# Patient Record
Sex: Female | Born: 1960
Health system: Southern US, Community
[De-identification: ages and names within clinical notes are randomized; demographics above are authoritative.]

## PROBLEM LIST (undated history)

## (undated) DIAGNOSIS — T7840XA Allergy, unspecified, initial encounter: Secondary | ICD-10-CM

## (undated) DIAGNOSIS — G43909 Migraine, unspecified, not intractable, without status migrainosus: Secondary | ICD-10-CM

## (undated) DIAGNOSIS — F32A Depression, unspecified: Secondary | ICD-10-CM

## (undated) DIAGNOSIS — D649 Anemia, unspecified: Secondary | ICD-10-CM

## (undated) DIAGNOSIS — C50919 Malignant neoplasm of unspecified site of unspecified female breast: Secondary | ICD-10-CM

## (undated) DIAGNOSIS — F329 Major depressive disorder, single episode, unspecified: Secondary | ICD-10-CM

## (undated) DIAGNOSIS — Z889 Allergy status to unspecified drugs, medicaments and biological substances status: Secondary | ICD-10-CM

## (undated) DIAGNOSIS — F419 Anxiety disorder, unspecified: Secondary | ICD-10-CM

## (undated) DIAGNOSIS — R1310 Dysphagia, unspecified: Secondary | ICD-10-CM

## (undated) DIAGNOSIS — K219 Gastro-esophageal reflux disease without esophagitis: Secondary | ICD-10-CM

## (undated) DIAGNOSIS — Z8619 Personal history of other infectious and parasitic diseases: Secondary | ICD-10-CM

## (undated) DIAGNOSIS — M1990 Unspecified osteoarthritis, unspecified site: Secondary | ICD-10-CM

## (undated) DIAGNOSIS — K5909 Other constipation: Secondary | ICD-10-CM

## (undated) DIAGNOSIS — K5792 Diverticulitis of intestine, part unspecified, without perforation or abscess without bleeding: Secondary | ICD-10-CM

## (undated) DIAGNOSIS — M549 Dorsalgia, unspecified: Secondary | ICD-10-CM

## (undated) DIAGNOSIS — K228 Other specified diseases of esophagus: Secondary | ICD-10-CM

## (undated) DIAGNOSIS — K5190 Ulcerative colitis, unspecified, without complications: Secondary | ICD-10-CM

## (undated) DIAGNOSIS — H269 Unspecified cataract: Secondary | ICD-10-CM

## (undated) DIAGNOSIS — K635 Polyp of colon: Secondary | ICD-10-CM

## (undated) DIAGNOSIS — E039 Hypothyroidism, unspecified: Secondary | ICD-10-CM

## (undated) DIAGNOSIS — G43019 Migraine without aura, intractable, without status migrainosus: Secondary | ICD-10-CM

## (undated) DIAGNOSIS — G629 Polyneuropathy, unspecified: Secondary | ICD-10-CM

## (undated) DIAGNOSIS — Z973 Presence of spectacles and contact lenses: Secondary | ICD-10-CM

## (undated) DIAGNOSIS — Z1379 Encounter for other screening for genetic and chromosomal anomalies: Secondary | ICD-10-CM

## (undated) DIAGNOSIS — Z9289 Personal history of other medical treatment: Secondary | ICD-10-CM

## (undated) DIAGNOSIS — K449 Diaphragmatic hernia without obstruction or gangrene: Secondary | ICD-10-CM

## (undated) DIAGNOSIS — K2281 Esophageal polyp: Secondary | ICD-10-CM

## (undated) HISTORY — PX: ABDOMINAL WOUND DEHISCENCE: SHX540

## (undated) HISTORY — PX: APPENDECTOMY: SHX54

## (undated) HISTORY — DX: Polyp of colon: K63.5

## (undated) HISTORY — PX: CHOLECYSTECTOMY: SHX55

## (undated) HISTORY — PX: UPPER GI ENDOSCOPY: SHX6162

## (undated) HISTORY — DX: Ulcerative colitis, unspecified, without complications: K51.90

## (undated) HISTORY — PX: MRI: SHX5353

## (undated) HISTORY — DX: Migraine, unspecified, not intractable, without status migrainosus: G43.909

## (undated) HISTORY — DX: Malignant neoplasm of unspecified site of unspecified female breast: C50.919

## (undated) HISTORY — PX: COLONOSCOPY: SHX174

## (undated) HISTORY — DX: Diaphragmatic hernia without obstruction or gangrene: K44.9

## (undated) HISTORY — DX: Unspecified cataract: H26.9

## (undated) HISTORY — DX: Hypothyroidism, unspecified: E03.9

## (undated) HISTORY — DX: Migraine without aura, intractable, without status migrainosus: G43.019

## (undated) HISTORY — DX: Dorsalgia, unspecified: M54.9

## (undated) HISTORY — PX: TONSILLECTOMY: SUR1361

## (undated) HISTORY — PX: BREAST SURGERY: SHX581

## (undated) HISTORY — DX: Allergy, unspecified, initial encounter: T78.40XA

## (undated) HISTORY — DX: Major depressive disorder, single episode, unspecified: F32.9

## (undated) HISTORY — PX: EYE SURGERY: SHX253

## (undated) HISTORY — DX: Polyneuropathy, unspecified: G62.9

## (undated) HISTORY — DX: Encounter for other screening for genetic and chromosomal anomalies: Z13.79

## (undated) HISTORY — PX: SPINE SURGERY: SHX786

## (undated) HISTORY — DX: Depression, unspecified: F32.A

## (undated) HISTORY — DX: Anxiety disorder, unspecified: F41.9

---
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
## 2003-04-04 ENCOUNTER — Other Ambulatory Visit: Admission: RE | Admit: 2003-04-04 | Discharge: 2003-04-04 | Payer: Self-pay | Admitting: Obstetrics and Gynecology

## 2003-09-14 ENCOUNTER — Encounter: Admission: RE | Admit: 2003-09-14 | Discharge: 2003-09-14 | Payer: Self-pay | Admitting: Family Medicine

## 2003-11-07 ENCOUNTER — Other Ambulatory Visit: Admission: RE | Admit: 2003-11-07 | Discharge: 2003-11-07 | Payer: Self-pay | Admitting: Obstetrics and Gynecology

## 2005-05-01 ENCOUNTER — Other Ambulatory Visit: Admission: RE | Admit: 2005-05-01 | Discharge: 2005-05-01 | Payer: Self-pay | Admitting: Obstetrics and Gynecology

## 2006-07-21 ENCOUNTER — Other Ambulatory Visit: Admission: RE | Admit: 2006-07-21 | Discharge: 2006-07-21 | Payer: Self-pay | Admitting: Obstetrics and Gynecology

## 2006-10-29 ENCOUNTER — Ambulatory Visit: Payer: Self-pay | Admitting: Family Medicine

## 2006-10-29 LAB — CONVERTED CEMR LAB: TSH: 4.11 microintl units/mL (ref 0.35–5.50)

## 2006-12-01 DIAGNOSIS — F32A Depression, unspecified: Secondary | ICD-10-CM | POA: Insufficient documentation

## 2006-12-01 DIAGNOSIS — J309 Allergic rhinitis, unspecified: Secondary | ICD-10-CM | POA: Insufficient documentation

## 2006-12-01 DIAGNOSIS — F329 Major depressive disorder, single episode, unspecified: Secondary | ICD-10-CM

## 2006-12-01 DIAGNOSIS — J45909 Unspecified asthma, uncomplicated: Secondary | ICD-10-CM | POA: Insufficient documentation

## 2006-12-01 DIAGNOSIS — B009 Herpesviral infection, unspecified: Secondary | ICD-10-CM | POA: Insufficient documentation

## 2006-12-01 DIAGNOSIS — E039 Hypothyroidism, unspecified: Secondary | ICD-10-CM | POA: Insufficient documentation

## 2007-06-30 ENCOUNTER — Ambulatory Visit: Payer: Self-pay | Admitting: Family Medicine

## 2007-06-30 ENCOUNTER — Telehealth (INDEPENDENT_AMBULATORY_CARE_PROVIDER_SITE_OTHER): Payer: Self-pay | Admitting: *Deleted

## 2007-06-30 DIAGNOSIS — L2089 Other atopic dermatitis: Secondary | ICD-10-CM | POA: Insufficient documentation

## 2007-06-30 LAB — CONVERTED CEMR LAB: TSH: 6.24 microintl units/mL — ABNORMAL HIGH (ref 0.35–5.50)

## 2007-07-30 ENCOUNTER — Ambulatory Visit: Payer: Self-pay | Admitting: Family Medicine

## 2007-08-11 ENCOUNTER — Telehealth (INDEPENDENT_AMBULATORY_CARE_PROVIDER_SITE_OTHER): Payer: Self-pay | Admitting: *Deleted

## 2007-09-01 ENCOUNTER — Other Ambulatory Visit: Admission: RE | Admit: 2007-09-01 | Discharge: 2007-09-01 | Payer: Self-pay | Admitting: Obstetrics and Gynecology

## 2007-09-01 ENCOUNTER — Encounter (INDEPENDENT_AMBULATORY_CARE_PROVIDER_SITE_OTHER): Payer: Self-pay | Admitting: Family Medicine

## 2007-09-20 ENCOUNTER — Telehealth (INDEPENDENT_AMBULATORY_CARE_PROVIDER_SITE_OTHER): Payer: Self-pay | Admitting: *Deleted

## 2007-10-05 LAB — HM MAMMOGRAPHY: HM Mammogram: NORMAL

## 2007-10-05 LAB — CONVERTED CEMR LAB: Pap Smear: NORMAL

## 2007-10-29 ENCOUNTER — Ambulatory Visit: Payer: Self-pay | Admitting: Family Medicine

## 2007-11-02 ENCOUNTER — Telehealth (INDEPENDENT_AMBULATORY_CARE_PROVIDER_SITE_OTHER): Payer: Self-pay | Admitting: *Deleted

## 2007-11-02 LAB — CONVERTED CEMR LAB
Albumin: 3.6 g/dL (ref 3.5–5.2)
TSH: 2.29 microintl units/mL (ref 0.35–5.50)
Total Protein: 6.9 g/dL (ref 6.0–8.3)

## 2007-11-03 ENCOUNTER — Encounter (INDEPENDENT_AMBULATORY_CARE_PROVIDER_SITE_OTHER): Payer: Self-pay | Admitting: *Deleted

## 2007-11-12 ENCOUNTER — Encounter (INDEPENDENT_AMBULATORY_CARE_PROVIDER_SITE_OTHER): Payer: Self-pay | Admitting: *Deleted

## 2007-11-12 ENCOUNTER — Ambulatory Visit: Payer: Self-pay | Admitting: Internal Medicine

## 2008-01-24 ENCOUNTER — Ambulatory Visit: Payer: Self-pay | Admitting: *Deleted

## 2008-02-08 ENCOUNTER — Encounter: Payer: Self-pay | Admitting: Family Medicine

## 2008-04-14 ENCOUNTER — Ambulatory Visit: Payer: Self-pay | Admitting: Family Medicine

## 2008-04-14 ENCOUNTER — Ambulatory Visit (HOSPITAL_COMMUNITY): Admission: RE | Admit: 2008-04-14 | Discharge: 2008-04-14 | Payer: Self-pay | Admitting: Family Medicine

## 2008-04-17 ENCOUNTER — Telehealth (INDEPENDENT_AMBULATORY_CARE_PROVIDER_SITE_OTHER): Payer: Self-pay | Admitting: *Deleted

## 2008-04-28 ENCOUNTER — Ambulatory Visit: Payer: Self-pay | Admitting: Family Medicine

## 2008-06-07 ENCOUNTER — Ambulatory Visit: Payer: Self-pay | Admitting: Family Medicine

## 2008-06-09 ENCOUNTER — Encounter (INDEPENDENT_AMBULATORY_CARE_PROVIDER_SITE_OTHER): Payer: Self-pay | Admitting: *Deleted

## 2008-06-12 ENCOUNTER — Telehealth (INDEPENDENT_AMBULATORY_CARE_PROVIDER_SITE_OTHER): Payer: Self-pay | Admitting: *Deleted

## 2008-07-26 ENCOUNTER — Ambulatory Visit: Payer: Self-pay | Admitting: Family Medicine

## 2008-07-27 ENCOUNTER — Encounter (INDEPENDENT_AMBULATORY_CARE_PROVIDER_SITE_OTHER): Payer: Self-pay | Admitting: *Deleted

## 2008-09-04 ENCOUNTER — Ambulatory Visit: Payer: Self-pay | Admitting: Family Medicine

## 2008-09-05 ENCOUNTER — Telehealth: Payer: Self-pay | Admitting: Internal Medicine

## 2008-09-06 ENCOUNTER — Telehealth: Payer: Self-pay | Admitting: Family Medicine

## 2008-09-06 ENCOUNTER — Telehealth (INDEPENDENT_AMBULATORY_CARE_PROVIDER_SITE_OTHER): Payer: Self-pay | Admitting: *Deleted

## 2008-09-14 ENCOUNTER — Ambulatory Visit: Payer: Self-pay | Admitting: Family Medicine

## 2008-10-12 ENCOUNTER — Other Ambulatory Visit: Admission: RE | Admit: 2008-10-12 | Discharge: 2008-10-12 | Payer: Self-pay | Admitting: Obstetrics and Gynecology

## 2008-10-12 ENCOUNTER — Telehealth (INDEPENDENT_AMBULATORY_CARE_PROVIDER_SITE_OTHER): Payer: Self-pay | Admitting: *Deleted

## 2008-10-24 ENCOUNTER — Ambulatory Visit: Payer: Self-pay | Admitting: Family Medicine

## 2008-10-25 ENCOUNTER — Telehealth (INDEPENDENT_AMBULATORY_CARE_PROVIDER_SITE_OTHER): Payer: Self-pay | Admitting: *Deleted

## 2008-10-25 LAB — CONVERTED CEMR LAB
Basophils Relative: 0.5 % (ref 0.0–3.0)
Eosinophils Absolute: 0.1 10*3/uL (ref 0.0–0.7)
Hemoglobin: 12.3 g/dL (ref 12.0–15.0)
MCHC: 34 g/dL (ref 30.0–36.0)
MCV: 88.8 fL (ref 78.0–100.0)
Monocytes Absolute: 0.5 10*3/uL (ref 0.1–1.0)
Neutro Abs: 4.2 10*3/uL (ref 1.4–7.7)
Neutrophils Relative %: 61.9 % (ref 43.0–77.0)
RBC: 4.08 M/uL (ref 3.87–5.11)
RDW: 12.9 % (ref 11.5–14.6)

## 2008-11-02 ENCOUNTER — Encounter: Admission: RE | Admit: 2008-11-02 | Discharge: 2008-12-12 | Payer: Self-pay

## 2009-01-12 ENCOUNTER — Ambulatory Visit: Payer: Self-pay | Admitting: Family Medicine

## 2009-01-15 ENCOUNTER — Encounter (INDEPENDENT_AMBULATORY_CARE_PROVIDER_SITE_OTHER): Payer: Self-pay | Admitting: *Deleted

## 2009-01-15 LAB — CONVERTED CEMR LAB
ALT: 13 units/L (ref 0–35)
Basophils Relative: 0.1 % (ref 0.0–3.0)
Bilirubin, Direct: 0 mg/dL (ref 0.0–0.3)
Chloride: 107 meq/L (ref 96–112)
Eosinophils Relative: 0.8 % (ref 0.0–5.0)
HCT: 38.2 % (ref 36.0–46.0)
Hemoglobin: 12.8 g/dL (ref 12.0–15.0)
LDL Cholesterol: 102 mg/dL — ABNORMAL HIGH (ref 0–99)
Lymphs Abs: 1.9 10*3/uL (ref 0.7–4.0)
MCV: 90.3 fL (ref 78.0–100.0)
Monocytes Absolute: 0.5 10*3/uL (ref 0.1–1.0)
Potassium: 4.2 meq/L (ref 3.5–5.1)
RBC: 4.23 M/uL (ref 3.87–5.11)
T3, Free: 3.1 pg/mL (ref 2.3–4.2)
TSH: 0.53 microintl units/mL (ref 0.35–5.50)
Total CHOL/HDL Ratio: 3
Total Protein: 6.9 g/dL (ref 6.0–8.3)
WBC: 6.5 10*3/uL (ref 4.5–10.5)

## 2009-07-10 ENCOUNTER — Telehealth (INDEPENDENT_AMBULATORY_CARE_PROVIDER_SITE_OTHER): Payer: Self-pay | Admitting: *Deleted

## 2009-07-10 ENCOUNTER — Ambulatory Visit: Payer: Self-pay | Admitting: Family

## 2009-07-10 ENCOUNTER — Encounter (INDEPENDENT_AMBULATORY_CARE_PROVIDER_SITE_OTHER): Payer: Self-pay | Admitting: *Deleted

## 2009-07-10 LAB — CONVERTED CEMR LAB: Folate: 7.9 ng/mL

## 2009-07-11 ENCOUNTER — Ambulatory Visit: Payer: Self-pay | Admitting: Cardiology

## 2009-07-11 ENCOUNTER — Telehealth (INDEPENDENT_AMBULATORY_CARE_PROVIDER_SITE_OTHER): Payer: Self-pay | Admitting: *Deleted

## 2009-07-12 ENCOUNTER — Ambulatory Visit: Payer: Self-pay | Admitting: Family

## 2009-07-12 DIAGNOSIS — E538 Deficiency of other specified B group vitamins: Secondary | ICD-10-CM | POA: Insufficient documentation

## 2009-07-12 LAB — CONVERTED CEMR LAB
HDL: 51.9 mg/dL (ref >39.00)
VLDL: 12 mg/dL (ref 0.0–40.0)

## 2009-07-20 ENCOUNTER — Ambulatory Visit: Payer: Self-pay | Admitting: Family

## 2009-07-26 ENCOUNTER — Ambulatory Visit: Payer: Self-pay | Admitting: Family

## 2009-08-02 ENCOUNTER — Ambulatory Visit: Payer: Self-pay | Admitting: Family

## 2009-08-13 ENCOUNTER — Ambulatory Visit: Payer: Self-pay | Admitting: Family

## 2009-08-13 DIAGNOSIS — G43909 Migraine, unspecified, not intractable, without status migrainosus: Secondary | ICD-10-CM | POA: Insufficient documentation

## 2009-08-13 LAB — CONVERTED CEMR LAB: TSH: 0.031 microintl units/mL — ABNORMAL LOW (ref 0.350–4.500)

## 2009-08-14 ENCOUNTER — Telehealth: Payer: Self-pay | Admitting: Family

## 2009-08-15 ENCOUNTER — Telehealth (INDEPENDENT_AMBULATORY_CARE_PROVIDER_SITE_OTHER): Payer: Self-pay | Admitting: *Deleted

## 2009-08-27 ENCOUNTER — Ambulatory Visit: Payer: Self-pay | Admitting: Family

## 2009-08-29 ENCOUNTER — Telehealth: Payer: Self-pay | Admitting: Family

## 2009-09-03 ENCOUNTER — Encounter: Payer: Self-pay | Admitting: Family Medicine

## 2009-09-26 ENCOUNTER — Ambulatory Visit: Payer: Self-pay | Admitting: Family

## 2009-09-27 ENCOUNTER — Other Ambulatory Visit: Admission: RE | Admit: 2009-09-27 | Discharge: 2009-09-27 | Payer: Self-pay | Admitting: Obstetrics and Gynecology

## 2009-10-01 ENCOUNTER — Telehealth: Payer: Self-pay | Admitting: Family

## 2009-10-05 ENCOUNTER — Encounter: Payer: Self-pay | Admitting: Family Medicine

## 2009-10-05 ENCOUNTER — Ambulatory Visit: Payer: Self-pay | Admitting: Internal Medicine

## 2009-10-17 ENCOUNTER — Telehealth (INDEPENDENT_AMBULATORY_CARE_PROVIDER_SITE_OTHER): Payer: Self-pay | Admitting: *Deleted

## 2009-10-24 ENCOUNTER — Ambulatory Visit: Payer: Self-pay | Admitting: Family

## 2009-11-12 ENCOUNTER — Ambulatory Visit: Payer: Self-pay | Admitting: Family

## 2009-11-12 LAB — CONVERTED CEMR LAB: TSH: 0.212 microintl units/mL — ABNORMAL LOW (ref 0.350–4.500)

## 2009-11-13 ENCOUNTER — Telehealth: Payer: Self-pay | Admitting: Family

## 2009-11-22 ENCOUNTER — Telehealth: Payer: Self-pay | Admitting: Family

## 2009-11-23 ENCOUNTER — Ambulatory Visit: Payer: Self-pay | Admitting: Family Medicine

## 2009-12-25 ENCOUNTER — Ambulatory Visit: Payer: Self-pay | Admitting: Family Medicine

## 2009-12-26 LAB — CONVERTED CEMR LAB: TSH: 0.37 microintl units/mL (ref 0.35–5.50)

## 2010-01-10 ENCOUNTER — Telehealth (INDEPENDENT_AMBULATORY_CARE_PROVIDER_SITE_OTHER): Payer: Self-pay | Admitting: *Deleted

## 2010-01-16 ENCOUNTER — Telehealth: Payer: Self-pay | Admitting: Family Medicine

## 2010-01-24 ENCOUNTER — Ambulatory Visit: Payer: Self-pay | Admitting: Family Medicine

## 2010-02-07 ENCOUNTER — Ambulatory Visit: Payer: Self-pay | Admitting: Family Medicine

## 2010-02-07 DIAGNOSIS — R002 Palpitations: Secondary | ICD-10-CM | POA: Insufficient documentation

## 2010-03-20 ENCOUNTER — Encounter (INDEPENDENT_AMBULATORY_CARE_PROVIDER_SITE_OTHER): Payer: Self-pay | Admitting: *Deleted

## 2010-03-27 ENCOUNTER — Encounter (INDEPENDENT_AMBULATORY_CARE_PROVIDER_SITE_OTHER): Payer: Self-pay | Admitting: *Deleted

## 2010-04-01 ENCOUNTER — Ambulatory Visit: Payer: Self-pay | Admitting: Internal Medicine

## 2010-04-05 ENCOUNTER — Ambulatory Visit: Payer: Self-pay | Admitting: Family Medicine

## 2010-04-05 DIAGNOSIS — K5289 Other specified noninfective gastroenteritis and colitis: Secondary | ICD-10-CM | POA: Insufficient documentation

## 2010-04-05 LAB — CONVERTED CEMR LAB
Basophils Relative: 0.4 % (ref 0.0–3.0)
CO2: 30 meq/L (ref 19–32)
Calcium: 9.7 mg/dL (ref 8.4–10.5)
Eosinophils Relative: 0.9 % (ref 0.0–5.0)
GFR calc non Af Amer: 96.74 mL/min (ref >60)
Hemoglobin: 13.5 g/dL (ref 12.0–15.0)
Lymphocytes Relative: 30.7 % (ref 12.0–46.0)
Monocytes Relative: 7.6 % (ref 3.0–12.0)
Neutro Abs: 3.8 10*3/uL (ref 1.4–7.7)
RBC: 4.51 M/uL (ref 3.87–5.11)
Sodium: 140 meq/L (ref 135–145)
WBC: 6.4 10*3/uL (ref 4.5–10.5)

## 2010-04-10 ENCOUNTER — Ambulatory Visit: Payer: Self-pay | Admitting: Gastroenterology

## 2010-04-10 LAB — HM COLONOSCOPY: HM Colonoscopy: NORMAL

## 2010-04-14 ENCOUNTER — Encounter: Payer: Self-pay | Admitting: Gastroenterology

## 2010-04-17 ENCOUNTER — Telehealth: Payer: Self-pay | Admitting: Gastroenterology

## 2010-05-28 ENCOUNTER — Ambulatory Visit: Payer: Self-pay | Admitting: Family Medicine

## 2010-05-28 DIAGNOSIS — G629 Polyneuropathy, unspecified: Secondary | ICD-10-CM | POA: Insufficient documentation

## 2010-06-04 ENCOUNTER — Telehealth (INDEPENDENT_AMBULATORY_CARE_PROVIDER_SITE_OTHER): Payer: Self-pay | Admitting: *Deleted

## 2010-06-06 ENCOUNTER — Ambulatory Visit: Payer: Self-pay | Admitting: Family Medicine

## 2010-06-06 DIAGNOSIS — R05 Cough: Secondary | ICD-10-CM

## 2010-06-06 DIAGNOSIS — M549 Dorsalgia, unspecified: Secondary | ICD-10-CM | POA: Insufficient documentation

## 2010-06-06 DIAGNOSIS — R059 Cough, unspecified: Secondary | ICD-10-CM | POA: Insufficient documentation

## 2010-06-07 ENCOUNTER — Ambulatory Visit (HOSPITAL_BASED_OUTPATIENT_CLINIC_OR_DEPARTMENT_OTHER)
Admission: RE | Admit: 2010-06-07 | Discharge: 2010-06-07 | Payer: Self-pay | Source: Home / Self Care | Admitting: Family Medicine

## 2010-06-07 LAB — CONVERTED CEMR LAB
Alkaline Phosphatase: 96 units/L (ref 39–117)
BUN: 15 mg/dL (ref 6–23)
Basophils Relative: 0.4 % (ref 0.0–3.0)
Bilirubin, Direct: 0.1 mg/dL (ref 0.0–0.3)
Calcium: 9.3 mg/dL (ref 8.4–10.5)
Cholesterol: 210 mg/dL — ABNORMAL HIGH (ref 0–200)
Creatinine, Ser: 0.7 mg/dL (ref 0.4–1.2)
Eosinophils Absolute: 0.1 10*3/uL (ref 0.0–0.7)
Eosinophils Relative: 0.8 % (ref 0.0–5.0)
Lymphocytes Relative: 36.7 % (ref 12.0–46.0)
MCHC: 34.1 g/dL (ref 30.0–36.0)
Neutrophils Relative %: 55.1 % (ref 43.0–77.0)
Platelets: 196 10*3/uL (ref 150.0–400.0)
RBC: 4.11 M/uL (ref 3.87–5.11)
Total Bilirubin: 0.5 mg/dL (ref 0.3–1.2)
Total CHOL/HDL Ratio: 3
Total Protein: 7.1 g/dL (ref 6.0–8.3)
Triglycerides: 50 mg/dL (ref 0.0–149.0)
VLDL: 10 mg/dL (ref 0.0–40.0)
Vit D, 25-Hydroxy: 50 ng/mL (ref 30–89)
WBC: 6.4 10*3/uL (ref 4.5–10.5)

## 2010-07-10 ENCOUNTER — Encounter: Payer: Self-pay | Admitting: Family Medicine

## 2010-08-04 LAB — CONVERTED CEMR LAB
Eosinophils Absolute: 0 10*3/uL (ref 0.0–0.7)
Lymphs Abs: 2.3 10*3/uL (ref 0.7–4.0)
MCV: 90.9 fL (ref 78.0–100.0)
Neutro Abs: 5.2 10*3/uL (ref 1.7–7.7)
Neutrophils Relative %: 65 % (ref 43–77)
Platelets: 276 10*3/uL (ref 150–400)
RBC: 4.51 M/uL (ref 3.87–5.11)
T3, Free: 2.5 pg/mL (ref 2.3–4.2)
T4, Total: 11.3 ug/dL (ref 5.0–12.5)
TSH: 0.7 microintl units/mL (ref 0.35–5.50)
TSH: 7.683 microintl units/mL — ABNORMAL HIGH (ref 0.350–4.50)
Vitamin B-12: 179 pg/mL — ABNORMAL LOW (ref 211–911)
Vitamin B-12: 561 pg/mL (ref 211–911)
WBC: 8 10*3/uL (ref 4.0–10.5)

## 2010-08-06 NOTE — Progress Notes (Signed)
Summary: Pt request test results copy  Phone Note Call from Patient Call back at Home Phone [phone]   Caller: Patient Summary of Call: Please mail a hard copy of TSH reports to pt, address on EMR is correct Initial call taken by: Lannette Donath,  Nov 22, 2009 8:49 AM  Follow-up for Phone Call        Last 2 TSH results mailed to pt.  Mervin Kung CMA  Nov 22, 2009 10:23 AM

## 2010-08-06 NOTE — Letter (Signed)
Summary: [facility]  [facility]   Imported By: Marylou Mccoy 08/23/2009 10:12:51  _____________________________________________________________________  External Attachment:    Type:   Image     Comment:   External Document

## 2010-08-06 NOTE — Progress Notes (Signed)
  Phone Note Outgoing Call   Summary of Call: Called patient and reviewed her TSH results and B12 results. Plan to decrease her synthroid and have her return in 6 weeks for follow up. Initial call taken by: Lemont Fillers FNP,  August 14, 2009 5:47 PM    New/Updated Medications: SYNTHROID 125 MCG TABS (LEVOTHYROXINE SODIUM) one tablet by mouth daily Prescriptions: SYNTHROID 125 MCG TABS (LEVOTHYROXINE SODIUM) one tablet by mouth daily  #30 x 1   Entered and Authorized by:   Lemont Fillers FNP   Signed by:   Lemont Fillers FNP on 08/14/2009   Method used:   Electronically to        [facility] # [phone]* (retail)       [address] [place], [place]  [address]       Ph: [phone]       Fax: [phone]   RxID:   [id number]

## 2010-08-06 NOTE — Miscellaneous (Signed)
Summary: [facility] previsit  Clinical Lists Changes  Medications: Added new medication of DULCOLAX 5 MG  TBEC (BISACODYL) Day before procedure take 2 at 3pm and 2 at 8pm. - Signed Added new medication of METOCLOPRAMIDE HCL 10 MG  TABS (METOCLOPRAMIDE HCL) As per prep instructions. - Signed Added new medication of MIRALAX   POWD (POLYETHYLENE GLYCOL 3350) As per prep  instructions. - Signed Rx of DULCOLAX 5 MG  TBEC (BISACODYL) Day before procedure take 2 at 3pm and 2 at 8pm.;  #4 x 0;  Signed;  Entered by: Karl Bales RN;  Authorized by: Hart Carwin MD;  Method used: Electronically to [facility] #[id number]*, [address], [place], [place]  [address], Ph: [phone], Fax: [phone] Rx of METOCLOPRAMIDE HCL 10 MG  TABS (METOCLOPRAMIDE HCL) As per prep instructions.;  #2 x 0;  Signed;  Entered by: Karl Bales RN;  Authorized by: Hart Carwin MD;  Method used: Electronically to [facility] #[id number]*, [address], [place], [place]  [address], Ph: [phone], Fax: [phone] Rx of MIRALAX   POWD (POLYETHYLENE GLYCOL 3350) As per prep  instructions.;  #255gm x 0;  Signed;  Entered by: Karl Bales RN;  Authorized by: Hart Carwin MD;  Method used: Electronically to [facility] #[id number]*, [address], [place], [place]  [address], Ph: [phone], Fax: [phone]    Prescriptions: MIRALAX   POWD (POLYETHYLENE GLYCOL 3350) As per prep  instructions.  #255gm x 0   Entered by:   Karl Bales RN   Authorized by:   Hart Carwin MD   Signed by:   Karl Bales RN on 04/01/2010   Method used:   Electronically to        [facility] #[id number]* (retail)       [address]       [place], [place]  [address]       Ph: [phone]       Fax: [phone]   RxID:   [id number] METOCLOPRAMIDE HCL 10 MG  TABS (METOCLOPRAMIDE HCL) As per prep instructions.  #2 x 0   Entered by:   Karl Bales RN   Authorized by:   Hart Carwin MD   Signed  by:   Karl Bales RN on 04/01/2010   Method used:   Electronically to        [facility] #[id number]* (retail)       [address]       [place], [place]  [address]       Ph: [phone]       Fax: [phone]   RxID:   [id number] DULCOLAX 5 MG  TBEC (BISACODYL) Day before procedure take 2 at 3pm and 2 at 8pm.  #4 x 0   Entered by:   Karl Bales RN   Authorized by:   Hart Carwin MD   Signed by:   Karl Bales RN on 04/01/2010   Method used:   Electronically to        [facility] #[id number]* (retail)       [address]       [place], [place]  [address]       Ph: [phone]       Fax: [phone]   RxID:   [id number]   Appended Document: [facility] previsit Miralax prep put sent to pharmacy in error.  Correct prep, Moviprep, sent to pharmacy and I notified [facility] as the which prep the patient is to receive.

## 2010-08-06 NOTE — Assessment & Plan Note (Signed)
Summary: b12 shot/kdc  Nurse Visit   Allergies: 1)  ! Aspirin  Medication Administration  Injection # 1:    Medication: Vit B12 1000 mcg    Diagnosis: OTHER VITAMIN B12 DEFICIENCY ANEMIA (ICD-281.1)    Route: IM    Site: R deltoid    Exp Date: 04/2011    Lot #: [id number]    Mfr: American Regent    Patient tolerated injection without complications    Given by: Floydene Flock (July 20, 2009 11:26 AM)  Orders Added: 1)  Admin of Therapeutic Inj  intramuscular or subcutaneous [96372] 2)  Vit B12 1000 mcg [J3420]   Medication Administration  Injection # 1:    Medication: Vit B12 1000 mcg    Diagnosis: OTHER VITAMIN B12 DEFICIENCY ANEMIA (ICD-281.1)    Route: IM    Site: R deltoid    Exp Date: 04/2011    Lot #: [id number]    Mfr: [employer] Regent    Patient tolerated injection without complications    Given by: Floydene Flock (July 20, 2009 11:26 AM)  Orders Added: 1)  Admin of Therapeutic Inj  intramuscular or subcutaneous [96372] 2)  Vit B12 1000 mcg [J3420]

## 2010-08-06 NOTE — Assessment & Plan Note (Signed)
Summary: 6 WEEK F/U AND LABS / TF,CMA   Vital Signs:  Patient profile:   50 year old female Height:      64 inches Weight:      154.50 pounds BMI:     26.62 Temp:     97.9 degrees F oral Pulse rate:   72 / minute Pulse rhythm:   regular Resp:     16 per minute BP sitting:   124 / 78  (right arm) Cuff size:   regular  Vitals Entered By: Mervin Kung CMA (Nov 12, 2009 9:38 AM) CC: room 4  6 week follow up, needs TSH drawn today.  Also wants to know if she need to continue B12 injections? Is Patient Diabetic? No   Primary Care Provider:  Neena Rhymes MD  CC:  room 4  6 week follow up and needs TSH drawn today.  Also wants to know if she need to continue B12 injections?.  History of Present Illness: [name] [name] is a 50 year old female who presents today for follow up.  1) Hypothyroid- notes that energy level is good.  Palpitations are resolved. Has not required propranolol.  2) B12 deficiency- denies numbness or paresthesias since starting B12.  Allergies: 1)  ! Aspirin  Physical Exam  General:  Well-developed,well-nourished,in no acute distress; alert,appropriate and cooperative throughout examination Head:  Normocephalic and atraumatic without obvious abnormalities. No apparent alopecia or balding. Neck:  No deformities, masses, or tenderness noted. No thyroid enlargement of masses noted on exam.  Lungs:  Normal respiratory effort, chest expands symmetrically. Lungs are clear to auscultation, no crackles or wheezes. Heart:  Normal rate and regular rhythm. S1 and S2 normal without gallop, murmur, click, rub or other extra sounds.   Impression & Recommendations:  Problem # 1:  HYPOTHYROIDISM (ICD-244.9) Assessment Unchanged Clinically stable, check TSH Her updated medication list for this problem includes:    Synthroid 100 Mcg Tabs (Levothyroxine sodium) ..... One tablet by mouth daily  Orders: T-TSH ([address]-19147)  Problem # 2:  B12 DEFICIENCY  (ICD-266.2) Assessment: Improved Paresthesias have resolved.  Patient feels better.  Continue monthly B12 injections.  Complete Medication List: 1)  Synthroid 100 Mcg Tabs (Levothyroxine sodium) .... One tablet by mouth daily 2)  Zyrtec Allergy 10 Mg Tabs (Cetirizine hcl) .... During season once daily 3)  Valtrex 500 Mg Tabs (Valacyclovir hcl) .... Take one tablet as needed  Patient Instructions: 1)  Please return fasting in July for a complete physical. 2)  Continue monthly b12 injections  Current Allergies (reviewed today): ! ASPIRIN

## 2010-08-06 NOTE — Miscellaneous (Signed)
Summary: BONE DENSITY  Clinical Lists Changes  Orders: Added new Test order of T-Bone Densitometry (77080) - Signed Added new Test order of T-Lumbar Vertebral Assessment (77082) - Signed 

## 2010-08-06 NOTE — Progress Notes (Signed)
Summary: discuss lab  Phone Note Outgoing Call   Call placed by: Lemont Fillers FNP,  July 10, 2009 5:54 PM Summary of Call: Please call patient and notify her that her b12 level IS low.  We will need to start B12 injections IM weekly for 1 month then monthly.   Will need a follow up b12 level in 1 month.  I still think that it is a good idea for her to complete her CT head since her symptoms are only on one side of her body.  Thanks Initial call taken by: Lemont Fillers FNP,  July 10, 2009 5:57 PM  Follow-up for Phone Call        left message on machine .............Marland KitchenDoristine Devoid  July 11, 2009 2:31 PM   spoke w/ patient aware of labs and appt scheduled to have b12 shots down and 1 month to recheck levels..........Marland KitchenDoristine Devoid  July 12, 2009 8:26 AM

## 2010-08-06 NOTE — Procedures (Signed)
Summary: Colonoscopy  Patient: [name] [name] Note: All result statuses are Final unless otherwise noted.  Tests: (1) Colonoscopy (COL)   COL Colonoscopy           DONE     [facility]     [address]     [place], [place]  [address]           COLONOSCOPY PROCEDURE REPORT           PATIENT:  [name] [name], [name] [name]  MR#:  [id number]     BIRTHDATE:  1961-06-23, 49 yrs. old  GENDER:  female     ENDOSCOPIST:  Rachael Fee, MD     REF. BY:  Helane Rima. Beverely Low, M.D.     PROCEDURE DATE:  04/10/2010     PROCEDURE:  Colonoscopy with snare polypectomy, Colonoscopy for     control of bleeding     ASA CLASS:  Class II     INDICATIONS:  Elevated Risk Screening, father had colon cancer     MEDICATIONS:   Fentanyl 75 mcg IV, Versed 8 mg IV           DESCRIPTION OF PROCEDURE:   After the risks benefits and     alternatives of the procedure were thoroughly explained, informed     consent was obtained.  Digital rectal exam was performed and     revealed no rectal masses.   The LB [id number] endoscope     was introduced through the anus and advanced to the cecum, which     was identified by both the appendix and ileocecal valve, without     limitations.  The quality of the prep was good, using MoviPrep.     The instrument was then slowly withdrawn as the colon was fully     examined.     <<PROCEDUREIMAGES>>           FINDINGS:  A pedunculated polyp was found in the sigmoid colon.     This was 7mm, removed with snare/cautery however cautery did not     function properly and there was more than usual post-polypectomy     bleeding that was controled with placement of a single endoclip at     the site. Polyp sent to pathology (jar 1) (see image4, image6, and     image8).  This was otherwise a normal examination of the colon     (see image9, image2, and image1).   Retroflexed views in the     rectum revealed no abnormalities.    The scope was then withdrawn     from the patient  and the procedure completed.           COMPLICATIONS:  None     ENDOSCOPIC IMPRESSION:     1) Pedunculated polyp in the sigmoid colon, removed and sent to     pathology     2) More than usual post polypectomy bleeding, treated with     endoclip placement     2) Otherwise normal examination           RECOMMENDATIONS:     1) Given your significant family history of colon cancer, you     should have a repeat colonoscopy in 5 years even if the polyp     removed today is not precancerous.     2) You will receive a letter within 1-2 weeks with the results     of your biopsy as well as final recommendations. Please  call my     office if you have not received a letter after 3 weeks.           ______________________________     Rachael Fee, MD           n.     eSIGNED:   Rachael Fee at 04/10/2010 01:59 PM           [name] [name], [id number]  Note: An exclamation mark (!) indicates a result that was not dispersed into the flowsheet. Document Creation Date: 04/10/2010 1:59 PM _______________________________________________________________________  (1) Order result status: Final Collection or observation date-time: 04/10/2010 13:53 Requested date-time:  Receipt date-time:  Reported date-time:  Referring Physician:   Ordering Physician: Rob Bunting [id number]) Specimen Source:  Source: Launa Grill Order Number: [id number] Lab site:   Appended Document: Colonoscopy     Procedures Next Due Date:    Colonoscopy: 04/2015

## 2010-08-06 NOTE — Progress Notes (Signed)
Summary: bone density referral- lm  Phone Note Outgoing Call   Summary of Call: Please call patient and let her know that I would like to send her for a bone density test.    Follow-up for Phone Call        left message on machine to return call. Follow-up by: Mervin Kung CMA,  September 03, 2009 9:28 AM  Additional Follow-up for Phone Call Additional follow up Details #1::        Pt called back and spoke to [facility]. Pt. stated she would call back later to schedule this appt. Additional Follow-up by: Mervin Kung CMA,  September 05, 2009 11:59 AM

## 2010-08-06 NOTE — Assessment & Plan Note (Signed)
Summary: NAUSEA, VOMITING, DIAHHREA///SPHQ   Vital Signs:  Patient profile:   50 year old female Height:      64 inches (162.56 cm) Weight:      150.25 pounds (68.30 kg) BMI:     25.88 Temp:     98.0 degrees F (36.67 degrees C) oral BP sitting:   112 / 76  (left arm) Cuff size:   regular  Vitals Entered By: Lucious Groves CMA (April 05, 2010 9:17 AM) CC: C/O N&V and diarrhea./kb Is Patient Diabetic? No Pain Assessment Patient in pain? no      Comments Patient notes that she has been having HA, but denies fever. She also notes that due to work she has not been eating properly. Per the patient, this is the second episode within 2.5 weeks./kb   History of Present Illness: 50 yo woman here today for N/V/D.  woke up yesterday w/ sxs.  had similar sxs 1-2 weeks ago.  'i'm not eating right.  i'm not taking care of myself'.  no fevers.  stools start as loose but then are watery.  5 episodes of diarrhea since yesterday.  vomited x8-10, now dry heaving.  no known sick contacts- but works in [place] and for the [employer].  recently got 2nd job.  denies hyperthyroid sxs- palpitations, wt loss, sweats.  denies abd pain other than cramping.  Current Medications (verified): 1)  Synthroid 88 Mcg Tabs (Levothyroxine Sodium) .... One Tablet By Mouth Daily 2)  Zyrtec Allergy 10 Mg  Tabs (Cetirizine Hcl) .... During Season Once Daily 3)  Valtrex 500 Mg Tabs (Valacyclovir Hcl) .... Take One Tablet As Needed 4)  Dulcolax 5 Mg  Tbec (Bisacodyl) .... Day Before Procedure Take 2 At 3pm and 2 At 8pm. 5)  Metoclopramide Hcl 10 Mg  Tabs (Metoclopramide Hcl) .... As Per Prep Instructions. 6)  Miralax   Powd (Polyethylene Glycol 3350) .... As Per Prep  Instructions. 7)  Moviprep 100 Gm  Solr (Peg-Kcl-Nacl-Nasulf-Na Asc-C) .... As Per Prep Instructions.  Allergies (verified): 1)  ! Aspirin  Social History: Occupation:Nurse at [employer], working extra shifts at NH Married 3 children- daughter in college, son committed  suicide 2010, oldest daughter is not in contact Alcohol use-no Drug use-no  Review of Systems      See HPI  Physical Exam  General:  uncomfortable appearing Lungs:  Normal respiratory effort, chest expands symmetrically. Lungs are clear to auscultation, no crackles or wheezes. Heart:  Normal rate and regular rhythm. S1 and S2 normal without gallop, murmur, click, rub or other extra sounds. Abdomen:  soft, NT/ND, +BS, no rebound/guarding.   Impression & Recommendations:  Problem # 1:  GASTROENTERITIS (ICD-558.9) Assessment New pt w/out red flag on hx or PE.  suspect viral illness but check CBC to r/o bacterial infxn.  check BMP to r/o electrolyte abnormality.  start phenergan for sxs relief.  encouraged fluids.  if no improvement by early next week will check stool studies- particularly C diff- given pt's work environment.  reviewed supportive care and red flags that should prompt return.  Pt expresses understanding and is in agreement w/ this plan. Orders: Venipuncture (16109) TLB-BMP (Basic Metabolic Panel-BMET) (80048-METABOL) TLB-CBC Platelet - w/Differential (85025-CBCD) Specimen Handling ([address])  Complete Medication List: 1)  Synthroid 88 Mcg Tabs (Levothyroxine sodium) .... One tablet by mouth daily 2)  Zyrtec Allergy 10 Mg Tabs (Cetirizine hcl) .... During season once daily 3)  Valtrex 500 Mg Tabs (Valacyclovir hcl) .... Take one tablet as needed 4)  Dulcolax 5 Mg Tbec (Bisacodyl) .... Day before procedure take 2 at 3pm and 2 at 8pm. 5)  Metoclopramide Hcl 10 Mg Tabs (Metoclopramide hcl) .... As per prep instructions. 6)  Miralax Powd (Polyethylene glycol 3350) .... As per prep  instructions. 7)  Moviprep 100 Gm Solr (Peg-kcl-nacl-nasulf-na asc-c) .... As per prep instructions. 8)  Promethazine Hcl 25 Mg Tabs (Promethazine hcl) .Marland Kitchen.. 1 tab by mouth q6 as needed for nausea/vomiting.  Patient Instructions: 1)  This appears to be viral and should improve w/ time 2)  If no  better on Monday- please call and we'll get stool studies 3)  We'll notify you of your lab results 4)  Drink plenty of fluids 5)  Use the phenergan to settle your stomach and allow you to drink 6)  REST! 7)  Call with any questions or concerns 8)  Hang in there!! Prescriptions: PROMETHAZINE HCL 25 MG  TABS (PROMETHAZINE HCL) 1 tab by mouth Q6 as needed for nausea/vomiting.  #30 x 0   Entered and Authorized by:   Neena Rhymes MD   Signed by:   Neena Rhymes MD on 04/05/2010   Method used:   Electronically to        [facility] #[id number]* (retail)       [address]       [place], [place]  [address]       Ph: [phone]       Fax: [phone]   RxID:   [id number]

## 2010-08-06 NOTE — Assessment & Plan Note (Signed)
Summary: b12 shot/kdc  Nurse Visit   Allergies: 1)  ! Aspirin  Medication Administration  Injection # 1:    Medication: Vit B12 1000 mcg    Diagnosis: OTHER VITAMIN B12 DEFICIENCY ANEMIA (ICD-281.1)    Route: IM    Site: R deltoid    Exp Date: 04/2011    Lot #: [id number]    Mfr: [employer] Regent    Patient tolerated injection without complications    Given by: Floydene Flock (August 02, 2009 1:19 PM)  Orders Added: 1)  Admin of Therapeutic Inj  intramuscular or subcutaneous [96372] 2)  Vit B12 1000 mcg [J3420]   Medication Administration  Injection # 1:    Medication: Vit B12 1000 mcg    Diagnosis: OTHER VITAMIN B12 DEFICIENCY ANEMIA (ICD-281.1)    Route: IM    Site: R deltoid    Exp Date: 04/2011    Lot #: [id number]    Mfr: [employer] Regent    Patient tolerated injection without complications    Given by: Floydene Flock (August 02, 2009 1:19 PM)  Orders Added: 1)  Admin of Therapeutic Inj  intramuscular or subcutaneous [96372] 2)  Vit B12 1000 mcg [J3420]

## 2010-08-06 NOTE — Consult Note (Signed)
[facility]   Imported By: Lanelle Bal 09/18/2009 13:53:40  _____________________________________________________________________  External Attachment:    Type:   Image     Comment:   External Document

## 2010-08-06 NOTE — Assessment & Plan Note (Signed)
Summary: NURSE / B12 / TF,CMA   CC:  Pt here for monthly B12 injection.  Pt states she feels fine since last visit. Pt will follow up with Nakiesha Rumsey on 11/12/09.  .  Allergies: 1)  ! Aspirin   Complete Medication List: 1)  Synthroid 100 Mcg Tabs (Levothyroxine sodium) .... One tablet by mouth daily 2)  Zyrtec Allergy 10 Mg Tabs (Cetirizine hcl) .... During season once daily 3)  Valtrex 500 Mg Tabs (Valacyclovir hcl) .... Take one tablet as needed 4)  Propranolol Hcl 10 Mg Tabs (Propranolol hcl) .... One tablet by mouth three times a day as needed  Other Orders: Admin of Therapeutic Inj  intramuscular or subcutaneous (19147) Vit B12 1000 mcg (J3420)  CC: Pt here for monthly B12 injection.  Pt states she feels fine since last visit. Pt will follow up with Jontay Maston on 11/12/09.       Medication Administration  Injection # 1:    Medication: Vit B12 1000 mcg    Diagnosis: OTHER VITAMIN B12 DEFICIENCY ANEMIA (ICD-281.1)    Route: IM    Site: L deltoid    Exp Date: 06/06/2011    Lot #: [id number]    Mfr: [employer] Regent    Comments: Pt. requested injection be given in her left arm as her right arm is still sore from a Tetanus shot.    Patient tolerated injection without complications    Given by: Mervin Kung CMA (October 24, 2009 9:44 AM)  Orders Added: 1)  Admin of Therapeutic Inj  intramuscular or subcutaneous [96372] 2)  Vit B12 1000 mcg [J3420]       Preventive Care Screening  Last Tetanus Booster:    Date:  10/22/2009    Results:  Historical

## 2010-08-06 NOTE — Progress Notes (Signed)
Summary: ct results   Phone Note Outgoing Call   Call placed by: Lemont Fillers FNP,  July 11, 2009 5:23 PM Summary of Call: Please call patient and let her know that her head CT is negative Initial call taken by: Lemont Fillers FNP,  July 11, 2009 5:23 PM  Follow-up for Phone Call        spoke w/ patient aware of results............Marland KitchenDoristine Devoid  July 13, 2009 1:34 PM

## 2010-08-06 NOTE — Assessment & Plan Note (Signed)
Summary: pain in right leg/kn   Vital Signs:  Patient profile:   50 year old female Weight:      152 pounds Pulse rate:   91 / minute BP sitting:   122 / 80  (left arm)  Vitals Entered By: Doristine Devoid CMA (May 28, 2010 9:29 AM) CC: R thigh painful along w/ numbness and tingling xfew months   History of Present Illness: 50 yo woman here today w/ R thigh pain.  describes it as a 'numbing sensation', 'like needles'.  sxs are located in a band from upper thigh to just above knee laterally.  denies weakness of the muscle.  'pain is becoming more and more annoying'.  no injury to hip or thigh but did injure back 8 months ago on R side.  no relief w/ NSAIDs or tylenol.  Current Medications (verified): 1)  Synthroid 88 Mcg Tabs (Levothyroxine Sodium) .... One Tablet By Mouth Daily 2)  Zyrtec Allergy 10 Mg  Tabs (Cetirizine Hcl) .... During Season Once Daily 3)  Valtrex 500 Mg Tabs (Valacyclovir Hcl) .... Take One Tablet As Needed  Allergies (verified): 1)  ! Aspirin  Review of Systems      See HPI  Physical Exam  General:  Well-developed,well-nourished,in no acute distress; alert,appropriate and cooperative throughout examination Msk:  normal strength, ROM of hip, knee bilaterally Pulses:  +2 DP/PT Extremities:  no C/C/E Neurologic:  parasthesia of R lateral upper leg   Impression & Recommendations:  Problem # 1:  NEUROPATHY (ICD-355.9) Assessment New start gabapentin for sxs relief.  refer for nerve conduction study. Orders: Orthopedic Referral (Ortho)  Complete Medication List: 1)  Synthroid 88 Mcg Tabs (Levothyroxine sodium) .... One tablet by mouth daily 2)  Zyrtec Allergy 10 Mg Tabs (Cetirizine hcl) .... During season once daily 3)  Valtrex 500 Mg Tabs (Valacyclovir hcl) .... Take one tablet as needed 4)  Gabapentin 300 Mg Caps (Gabapentin) .... Take 1 tab nightly x1 week and then increase to 1 tab two times a day x 1 week and then 1 tab three times a  day.  Patient Instructions: 1)  Someone will call you with your nerve conduction study 2)  Start the gabapentin for the nerve pain- if you become drowsy when you increase the meds you can always go back down 3)  Call with any questions or concerns 4)  Happy Thanksgiving! Prescriptions: GABAPENTIN 300 MG CAPS (GABAPENTIN) take 1 tab nightly x1 week and then increase to 1 tab two times a day x 1 week and then 1 tab three times a day.  #90 x 3   Entered and Authorized by:   Neena Rhymes MD   Signed by:   Neena Rhymes MD on 05/28/2010   Method used:   Electronically to        [facility] # [phone]* (retail)       [address] [place], [place]  [address]       Ph: [phone]       Fax: [phone]   RxID:   [id number]    Orders Added: 1)  Orthopedic Referral [Ortho] 2)  Est. Patient Level III [63875]

## 2010-08-06 NOTE — Letter (Signed)
   [facility] [address] [place], [place] [address] [phone]    August 27, 2009   [name] [name] [address] [place], [place] [address]  RE:  LAB RESULTS  Dear  [name],  The following is an interpretation of your most recent lab tests.  Please take note of any instructions provided or changes to medications that have resulted from your lab work.    Your b12 levels are up where they should be following your injections.  Also, you d. dimer test was normal.   Sincerely Yours,    Lemont Fillers FNP

## 2010-08-06 NOTE — Assessment & Plan Note (Signed)
Summary: b12/kn  Nurse Visit  CC: Vitamin B-12 inj./kb   Allergies: 1)  ! Aspirin  Medication Administration  Injection # 1:    Medication: Vit B12 1000 mcg    Diagnosis: B12 DEFICIENCY (ICD-266.2)    Route: IM    Site: R deltoid    Exp Date: 09/05/2011    Lot #: [id number]    Mfr: American Regent    Patient tolerated injection without complications    Given by: Lucious Groves CMA (January 24, 2010 9:59 AM)  Orders Added: 1)  Vit B12 1000 mcg [J3420] 2)  Admin of Therapeutic Inj  intramuscular or subcutaneous [84696]

## 2010-08-06 NOTE — Assessment & Plan Note (Signed)
Summary: b12 shot/kdc  Nurse Visit   Allergies: 1)  ! Aspirin  Medication Administration  Injection # 1:    Medication: Vit B12 1000 mcg    Diagnosis: OTHER VITAMIN B12 DEFICIENCY ANEMIA (ICD-281.1)    Route: IM    Site: L deltoid    Exp Date: 04/2011    Lot #: [id number]    Mfr: American Regent    Patient tolerated injection without complications    Given by: Floydene Flock (July 26, 2009 1:50 PM)  Orders Added: 1)  Admin of Therapeutic Inj  intramuscular or subcutaneous [96372] 2)  Vit B12 1000 mcg [J3420]   Medication Administration  Injection # 1:    Medication: Vit B12 1000 mcg    Diagnosis: OTHER VITAMIN B12 DEFICIENCY ANEMIA (ICD-281.1)    Route: IM    Site: L deltoid    Exp Date: 04/2011    Lot #: [id number]    Mfr: American Regent    Patient tolerated injection without complications    Given by: Floydene Flock (July 26, 2009 1:50 PM)  Orders Added: 1)  Admin of Therapeutic Inj  intramuscular or subcutaneous [96372] 2)  Vit B12 1000 mcg [J3420]

## 2010-08-06 NOTE — Assessment & Plan Note (Signed)
Summary: CHK THYROID--OK ON 6/21--NOW HEART PALP, NOT SLEEPING, FAST L...   Vital Signs:  Patient profile:   50 year old female Weight:      150 pounds O2 Sat:      98 % on Room air Pulse rate:   112 / minute BP sitting:   120 / 80  (left arm)  Vitals Entered By: Doristine Devoid CMA (February 07, 2010 11:07 AM)  O2 Flow:  Room air CC: palpitation, trouble sleeping, thyroid check    History of Present Illness: 50 yo woman here today for ? hyperthyroid.  pt c/o palpitations- more noticeable at night but does occur during work day.  intermittant.  also has  sweating and feels 'hyper'.  pt not sure if this is menopause or thyroid problems.  is under a lot of stress- recently bought a home and has moved.  no CP, SOB, N/V.  struggled to get thyroid under control earlier this year.  B12- has been on meds since Jan.  due for lab recheck  HSV- needs Valtrex refill      Current Medications (verified): 1)  Synthroid 88 Mcg Tabs (Levothyroxine Sodium) .... One Tablet By Mouth Daily 2)  Zyrtec Allergy 10 Mg  Tabs (Cetirizine Hcl) .... During Season Once Daily 3)  Valtrex 500 Mg Tabs (Valacyclovir Hcl) .... Take One Tablet As Needed  Allergies (verified): 1)  ! Aspirin  Past History:  Past Medical History: Last updated: 12/01/2006 Allergic rhinitis Asthma Depression Hypothyroidism Inflammatory colitis  Review of Systems      See HPI  Physical Exam  General:  Well-developed,well-nourished,in no acute distress; alert,appropriate and cooperative throughout examination Neck:  No deformities, masses, or tenderness noted. No thyroid enlargement of masses noted on exam.  Lungs:  Normal respiratory effort, chest expands symmetrically. Lungs are clear to auscultation, no crackles or wheezes. Heart:  Normal rate and regular rhythm. S1 and S2 normal without gallop, murmur, click, rub or other extra sounds. Pulses:  +2 carotid, radial, DP Extremities:  No clubbing, cyanosis, edema, or  deformity noted   Impression & Recommendations:  Problem # 1:  HYPOTHYROIDISM (ICD-244.9) Assessment Unchanged pt now having sxs that could be hyperthyroid.  check labs and adjust meds as needed. Her updated medication list for this problem includes:    Synthroid 88 Mcg Tabs (Levothyroxine sodium) ..... One tablet by mouth daily  Orders: Venipuncture ([address]) TLB-TSH (Thyroid Stimulating Hormone) (84443-TSH) Specimen Handling ([address]) EKG w/ Interpretation ([address])  Problem # 2:  B12 DEFICIENCY (ICD-266.2) Assessment: Unchanged check labs.  if normal will do 6 month holiday from injxns. Orders: TLB-B12, Serum-Total ONLY ([address]11-B14)  Problem # 3:  PALPITATIONS (ICD-785.1) Assessment: New  intermittant.  EKG normal.  could be thyroid or menopausal or stress.  will check TSH and follow closely.  reviewed red flags that should prompt return.   Orders: EKG w/ Interpretation ([address])  Problem # 4:  HSV (ICD-054.9) Assessment: Unchanged refill provided.  Complete Medication List: 1)  Synthroid 88 Mcg Tabs (Levothyroxine sodium) .... One tablet by mouth daily 2)  Zyrtec Allergy 10 Mg Tabs (Cetirizine hcl) .... During season once daily 3)  Valtrex 500 Mg Tabs (Valacyclovir hcl) .... Take one tablet as needed  Patient Instructions: 1)  We'll notify you of your lab results 2)  If the thyroid level is normal we'll assume this is menopause related 3)  We'll let you know if you need to continue the B12 shots 4)  Congrats on the new house! 5)  Call with any questions or concerns.Marland KitchenMarland KitchenHang in there! Prescriptions: VALTREX 500 MG TABS (VALACYCLOVIR HCL) take one tablet as needed  #30 x 3   Entered and Authorized by:   Neena Rhymes MD   Signed by:   Neena Rhymes MD on 02/07/2010   Method used:   Electronically to        [facility] #[id number]* (retail)       [address]       [place], [place]  [address]       Ph: [phone]       Fax: [phone]   RxID:    [id number]

## 2010-08-06 NOTE — Letter (Signed)
Summary: Results Letter  [facility]  [address] [place], [place] [address]   Phone: [phone]  Fax: [phone]        April 14, 2010 MRN: [id number]    [facility] [address] [place], [place]  [address]    Dear Ms. Chirco,   At least one of the polyps removed during your recent procedure was proven to be adenomatous.  These are pre-cancerous polyps that may have grown into cancers if they had not been removed.  Based on current nationally recognized surveillance guidelines, I recommend that you have a repeat colonoscopy in 5 years.  We will therefore put your information in our reminder system and will contact you in 5 years to schedule a repeat procedure.  Please call if you have any questions or concerns.       Sincerely,  Rachael Fee MD  This letter has been electronically signed by your physician.  Appended Document: Results Letter letter mailed

## 2010-08-06 NOTE — Assessment & Plan Note (Signed)
Summary: 2 week fu/b12/kdc   Vital Signs:  Patient profile:   50 year old female Weight:      157 pounds O2 Sat:      99 % on Room air Pulse rate:   72 / minute BP sitting:   112 / 80  (left arm)  Vitals Entered By: Doristine Devoid (August 27, 2009 11:24 AM)  O2 Flow:  Room air CC: 2 wk - still w/ migraines took maxalt caused clammy feeling and increased heart rate started w/ dizziness on sat.    Primary Care Provider:  Neena Rhymes MD  CC:  2 wk - still w/ migraines took maxalt caused clammy feeling and increased heart rate started w/ dizziness on sat. Marland Kitchen  History of Present Illness: Ms Erenest Blank is a 50 year old female who presents today for follow up.  Notes that she has had episodes of hear racing/sweats/chills- one episode on saturday night- had nurse check her HR and it was in the 80's.  She also notes associated dizziness.  Notes that she did take maxalt- it did help her headache on thursday- helped headache, but had heart racing.  Took imitrex saturday afternoon.  Today she feels like her hear it racing , feels shakey and dizzy.    Allergies: 1)  ! Aspirin  Physical Exam  General:  Nervous appearing white female in NAD Head:  Normocephalic and atraumatic without obvious abnormalities. No apparent alopecia or balding. Lungs:  Normal respiratory effort, chest expands symmetrically. Lungs are clear to auscultation, no crackles or wheezes. Heart:  Normal rate and regular rhythm. S1 and S2 normal without gallop, murmur, click, rub or other extra sounds. Psych:  Oriented X3 and slightly anxious.     Impression & Recommendations:  Problem # 1:  MIGRAINE, CHRONIC (ICD-346.90) Assessment Unchanged Minimal relief with migraine meds, will plan to refer patient to HA clinic for further evaluation. The following medications were removed from the medication list:    Maxalt-mlt 10 Mg Tbdp (Rizatriptan benzoate) .Marland Kitchen... Take one tablet by mouth as needed for headache, if no  improvment may repeat in 2 hours x 2 doses (max 30mg  per day) Her updated medication list for this problem includes:    Propranolol Hcl 10 Mg Tabs (Propranolol hcl) ..... One tablet by mouth three times a day as needed  Orders: Headache Clinic Referral (Headache)  Problem # 2:  HYPOTHYROIDISM (ICD-244.9) Assessment: Deteriorated Patient is overtreated, we dropped dose last visit from to 125 mcg.  Will add propranolol as needed (TSH 0.03 last visit)  I suspect that patient's complaints i.e. nervousness,  dizziness, sweats, palpitations, and possibly HA's are related to her thyroid.  Will add propranolol as needed and hopefully this will help with palpitations.  This may also help with HA.  Hopefully as TSH normalizes over the next month or so,   we can ease her off of the propranolol and her symptoms will improve.  EKG today in office was normal. Her updated medication list for this problem includes:    Synthroid 125 Mcg Tabs (Levothyroxine sodium) ..... One tablet by mouth daily  Problem # 3:  OTHER VITAMIN B12 DEFICIENCY ANEMIA (ICD-281.1) Assessment: Comment Only Injection today, will check f/u level Orders: TLB-B12, Serum-Total ONLY (82956-O13)  Complete Medication List: 1)  Synthroid 125 Mcg Tabs (Levothyroxine sodium) .... One tablet by mouth daily 2)  Zyrtec Allergy 10 Mg Tabs (Cetirizine hcl) .... During season once daily 3)  Valtrex 500 Mg Tabs (Valacyclovir hcl) .... Take  one tablet as needed 4)  Propranolol Hcl 10 Mg Tabs (Propranolol hcl) .... One tablet by mouth three times a day as needed  Other Orders: Venipuncture ([address]) T-D-Dimer Fibrin Derivatives Quantitive ([address]78-29562) Vit B12 1000 mcg (J3420) EKG w/ Interpretation (93000)  Patient Instructions: 1)  Please complete your lab work prior to leaving today. 2)  Please follow up in 6 weeks for a TSH 3)  You will be contacted about your appointment with the headache clinic. Prescriptions: PROPRANOLOL HCL 10 MG  TABS (PROPRANOLOL HCL) one tablet by mouth three times a day as needed  #90 x 1   Entered and Authorized by:   Lemont Fillers FNP   Signed by:   Lemont Fillers FNP on 08/27/2009   Method used:   Electronically to        [facility] # [phone]* (retail)       [address] [place], [place]  [address]       Ph: [phone]       Fax: [phone]   RxID:   [id number]    Medication Administration  Injection # 1:    Medication: Vit B12 1000 mcg    Diagnosis: FATIGUE (ICD-780.79)    Route: IM    Site: R deltoid    Exp Date: 11/15/2009    Lot #: [id number]    Mfr: American Regent    Patient tolerated injection without complications    Given by: Kandice Hams (August 27, 2009 12:17 PM)  Orders Added: 1)  Headache Clinic Referral [Headache] 2)  Venipuncture [43329] 3)  T-D-Dimer Fibrin Derivatives Quantitive [[address]-16606] 4)  Vit B12 1000 mcg [J3420] 5)  EKG w/ Interpretation [93000] 6)  TLB-B12, Serum-Total ONLY [82607-B12] 7)  Est. Patient Level IV [30160]

## 2010-08-06 NOTE — Progress Notes (Signed)
Summary: tsh result  Phone Note Outgoing Call   Summary of Call: Please call patient and let her know that her TSH is improving, but I would like to decrease her synthroid to and plan to have f/u TSH in 6 weeks please. (244.9) Initial call taken by: Lemont Fillers FNP,  Nov 13, 2009 10:16 AM  Follow-up for Phone Call        Pt advised of Tigran Haynie's instructions.  Lab appt. scheduled for 12/25/09 and order sent to lab.  Mervin Kung CMA  Nov 13, 2009 3:53 PM     New/Updated Medications: SYNTHROID 88 MCG TABS (LEVOTHYROXINE SODIUM) one tablet by mouth daily Prescriptions: SYNTHROID 88 MCG TABS (LEVOTHYROXINE SODIUM) one tablet by mouth daily  #30 x 1   Entered and Authorized by:   Lemont Fillers FNP   Signed by:   Lemont Fillers FNP on 11/13/2009   Method used:   Electronically to        [facility] # [phone]* (retail)       [address] [place], [place]  [address]       Ph: [phone]       Fax: [phone]   RxID:   [id number]

## 2010-08-06 NOTE — Progress Notes (Signed)
Summary: TSH result, appt.  Phone Note Outgoing Call   Summary of Call: Reviewed patient's TSH,  we need to drop her synthroid down further.  She needs follow up appointment and blood draw in 6 weeks Initial call taken by: Lemont Fillers FNP,  October 01, 2009 9:03 AM  Follow-up for Phone Call         Notified pt. of TSh result and need to decrease Synthroid strength. Rx. sent to pharmacy.  Follow up scheduled for 11/12/09 at 9:30. Will need labs that day as well.  Lab results mailed to pt. at her request.  Pt. also requested appt. for b12 inj. in April. Appt. made for nurse visit for 10/24/09 @ 9:30.  Nicki Guadalajara Fergerson CMA  October 01, 2009 9:24 AM     New/Updated Medications: SYNTHROID 100 MCG TABS (LEVOTHYROXINE SODIUM) one tablet by mouth daily Prescriptions: SYNTHROID 100 MCG TABS (LEVOTHYROXINE SODIUM) one tablet by mouth daily  #30 x 1   Entered and Authorized by:   Lemont Fillers FNP   Signed by:   Lemont Fillers FNP on 10/01/2009   Method used:   Electronically to        [facility] # [phone]* (retail)       [address] [place], [place]  [address]       Ph: [phone]       Fax: [phone]   RxID:   [id number]

## 2010-08-06 NOTE — Assessment & Plan Note (Signed)
Summary: b-12/cbs  Nurse Visit   Allergies: 1)  ! Aspirin  Medication Administration  Injection # 1:    Medication: Vit B12 1000 mcg    Diagnosis: B12 DEFICIENCY (ICD-266.2)    Route: IM    Site: R deltoid    Exp Date: 08/08/2011    Lot #: [id number]    Mfr: American Regent    Given by: Doristine Devoid (Nov 23, 2009 10:09 AM)  Orders Added: 1)  Vit B12 1000 mcg [J3420] 2)  Admin of Therapeutic Inj  intramuscular or subcutaneous [96372]    Medication Administration  Injection # 1:    Medication: Vit B12 1000 mcg    Diagnosis: B12 DEFICIENCY (ICD-266.2)    Route: IM    Site: R deltoid    Exp Date: 08/08/2011    Lot #: [id number]    Mfr: American Regent    Given by: Doristine Devoid (Nov 23, 2009 10:09 AM)  Orders Added: 1)  Vit B12 1000 mcg [J3420] 2)  Admin of Therapeutic Inj  intramuscular or subcutaneous [78295]

## 2010-08-06 NOTE — Letter (Signed)
Summary: Cancer Screening/[facility] Personalized Risk Profile  Cancer Screening/[facility] Personalized Risk Profile   Imported By: Lanelle Bal 06/11/2010 14:24:17  _____________________________________________________________________  External Attachment:    Type:   Image     Comment:   External Document

## 2010-08-06 NOTE — Assessment & Plan Note (Signed)
Summary: SEVERE HEADACHE FOR FEW WEEKS; LAB=CHECK B12 LEVEL///SPH   Vital Signs:  Patient profile:   50 year old female Weight:      160 pounds Temp:     97.6 degrees F oral BP sitting:   110 / 74  (right arm)  Vitals Entered By: [name] [name] (August 13, 2009 3:43 PM) CC: Migraines x1/12 since stopping BCP-Lo-Ovum has been taking otc meds w/o improvement , Headache   Primary Care Provider:  Neena Rhymes MD  CC:  Migraines x1/12 since stopping BCP-Lo-Ovum has been taking otc meds w/o improvement  and Headache.  History of Present Illness: [name] [name] is a 50 year old female who presents today with complaint of migraine headaches.  Notes that she discontinued loestrin in early January.  These HA's started on January 12th.  She notes that pain is on top and side of head.  Has tried ice pack, has also tried ibuprofen with very slight improvement.  She tells me that she has never had a history of migraine.  Notes that she has had episodes where symptoms have improved, but have never gone away. Symptoms keep her up at night as well.  She sees Dr. Loura Back from GYN.    Allergies: 1)  ! Aspirin  Review of Systems       +phonophobia, +photophobia  Physical Exam  General:  Well-developed,well-nourished,in no acute distress; alert,appropriate and cooperative throughout examination Head:  Normocephalic and atraumatic without obvious abnormalities. No apparent alopecia or balding. Lungs:  Normal respiratory effort, chest expands symmetrically. Lungs are clear to auscultation, no crackles or wheezes. Heart:  Normal rate and regular rhythm. S1 and S2 normal without gallop, murmur, click, rub or other extra sounds.   Impression & Recommendations:  Problem # 1:  MIGRAINE, CHRONIC (ICD-346.90) Assessment New It is possiblet that recent change in hormones are precipitating these headaches.  Will give trial of imitrex.  Patient did have a CT head in January which was negative.   Her  updated medication list for this problem includes:    Imitrex 50 Mg Tabs (Sumatriptan succinate) ..... One tablet by mouth for migraine may repeat x 1 two hours later  Problem # 2:  OTHER VITAMIN B12 DEFICIENCY ANEMIA (ICD-281.1) Notes improvement in paresthesias since initiation of B12 injections.  Will plan to check level and if stable continue with monthly B12 injections.  Orders: T-Vitamin B12 ([address])  Problem # 3:  HYPOTHYROIDISM (ICD-244.9)  Her updated medication list for this problem includes:    Synthroid 150 Mcg Tabs (Levothyroxine sodium) .Marland Kitchen... 1 by mouth once daily  Orders: T-TSH ([address])  Complete Medication List: 1)  Synthroid 150 Mcg Tabs (Levothyroxine sodium) .Marland Kitchen.. 1 by mouth once daily 2)  Zyrtec Allergy 10 Mg Tabs (Cetirizine hcl) .... During season once daily 3)  Valtrex 500 Mg Tabs (Valacyclovir hcl) .... Take one tablet as needed 4)  Imitrex 50 Mg Tabs (Sumatriptan succinate) .... One tablet by mouth for migraine may repeat x 1 two hours later  Patient Instructions: 1)  Please complete your lab work this afternoon prior to leaving.   2)  Follow up in 1 month for b12 injection unless we instuct you otherwise. 3)  Please call if your headached symptoms are not improved by imitrex. Prescriptions: IMITREX 50 MG TABS (SUMATRIPTAN SUCCINATE) one tablet by mouth for migraine may repeat x 1 two hours later  #10 x 0   Entered and Authorized by:   Lemont Fillers FNP   Signed by:  Lemont Fillers FNP on 08/13/2009   Method used:   Electronically to        [facility] # [phone]* (retail)       [address] [place], [place]  [address]       Ph: [phone]       Fax: [phone]   RxID:   [id number]
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
## 2010-08-06 NOTE — Progress Notes (Signed)
Summary: meds for headache not working   Phone Note Call from Patient   Caller: pt live Call For: Brayton Caves Summary of Call: Saw you yesterday for a headache.  Was told if the meds did not waork to call in.  the meds are not working.  If you are going to send a different med please send to [facility].  Patient can be reached at [phone] Initial call taken by: Roselle Locus,  August 14, 2009 1:59 PM  Follow-up for Phone Call        Called patient, she is having minimal improvement with imitrex.  Will give trial of maxalt, if no improvement plan referral to [facility] clinic Follow-up by: Lemont Fillers FNP,  August 14, 2009 3:26 PM    New/Updated Medications: MAXALT-MLT 10 MG TBDP (RIZATRIPTAN BENZOATE) take one tablet by mouth as needed for headache, if no improvment may repeat in 2 hours x 2 doses (max 30mg  per day) Prescriptions: MAXALT-MLT 10 MG TBDP (RIZATRIPTAN BENZOATE) take one tablet by mouth as needed for headache, if no improvment may repeat in 2 hours x 2 doses (max 30mg  per day)  #10 x 1   Entered and Authorized by:   Lemont Fillers FNP   Signed by:   Lemont Fillers FNP on 08/14/2009   Method used:   Electronically to        [facility] # [phone]* (retail)       [address] [place], [place]  [address]       Ph: [phone]       Fax: [phone]   RxID:   [id number]

## 2010-08-06 NOTE — Miscellaneous (Signed)
Summary: Moviprep rx  Clinical Lists Changes  Medications: Added new medication of MOVIPREP 100 GM  SOLR (PEG-KCL-NACL-NASULF-NA ASC-C) As per prep instructions. - Signed Rx of MOVIPREP 100 GM  SOLR (PEG-KCL-NACL-NASULF-NA ASC-C) As per prep instructions.;  #1 x 0;  Signed;  Entered by: Karl Bales RN;  Authorized by: Hart Carwin MD;  Method used: Electronically to [facility] #[id number]*, [address], [place], [place]  [address], Ph: [phone], Fax: [phone]    Prescriptions: MOVIPREP 100 GM  SOLR (PEG-KCL-NACL-NASULF-NA ASC-C) As per prep instructions.  #1 x 0   Entered by:   Karl Bales RN   Authorized by:   Hart Carwin MD   Signed by:   Karl Bales RN on 04/01/2010   Method used:   Electronically to        [facility] #[id number]* (retail)       [address]       [place], [place]  [address]       Ph: [phone]       Fax: [phone]   RxID:   [id number]

## 2010-08-06 NOTE — Progress Notes (Signed)
Summary: f/u appt-  Phone Note Outgoing Call   Call placed by: Lemont Fillers FNP,  August 15, 2009 9:17 AM Summary of Call: Could you please call patient and arrange a follow up appointment in 2 weeks so that we may follow up on her headache symptoms.    Follow-up for Phone Call        left message on machine ....Marland KitchenMarland KitchenDoristine [name]  August 16, 2009 1:14 PM   Pt called back was informed of call need 2 week followup with Sandford Craze on headaches, pt says will check her schedule and call back tomorrow to schedule Follow-up by: Kandice Hams,  August 16, 2009 4:37 PM

## 2010-08-06 NOTE — Progress Notes (Signed)
Summary: results ?'s  Phone Note Call from Patient Call back at Home Phone [phone]   Caller: Patient Call For: Dr. Christella Hartigan Reason for Call: Talk to Nurse Summary of Call: would like to go over COL results Initial call taken by: Vallarie Mare,  April 17, 2010 4:08 PM  Follow-up for Phone Call        pt was questioning how many polyps were removed.  I advised her that One polyp was removed. She had no further questions Follow-up by: Chales Abrahams CMA Duncan Dull),  April 17, 2010 4:30 PM

## 2010-08-06 NOTE — Progress Notes (Signed)
Summary: Question about Endo Clip  Phone Note Other Incoming   Summary of Call: Pt called and is asking what type of clip was used in her procedure, she needs an MRI and it is info the radiologist needs.  Please fax to [facility] [phone]. Initial call taken by: Chales Abrahams CMA Duncan Dull),  June 04, 2010 12:41 PM  Follow-up for Phone Call        tell her that the clip has probably passed already but she needs a KUB done to check to be sure.  Put on KUB orders that a colonic clip was placed in sigmoid colon, checking to see if it is stillpresent. Follow-up by: Rachael Fee MD,  June 04, 2010 12:59 PM  Additional Follow-up for Phone Call Additional follow up Details #1::        Dr Christella Hartigan she said they did an xray and the clip is still in place. Chales Abrahams CMA Duncan Dull)  June 04, 2010 1:03 PM     Additional Follow-up for Phone Call Additional follow up Details #2::    i communicated with BS [employer].  HE said that it is probably safe if it's been in place for >3-4 weeks.  Their company has never had a complaint about a clip coming off in MRI.  Her clip has been there 6 weeks, tell her it is OK to go ahead with mri Follow-up by: Rachael Fee MD,  June 04, 2010 1:18 PM  Additional Follow-up for Phone Call Additional follow up Details #3:: Details for Additional Follow-up Action Taken: I will fax this communication to [facility]. Additional Follow-up by: Chales Abrahams CMA Duncan Dull),  June 04, 2010 1:26 PM

## 2010-08-06 NOTE — Progress Notes (Signed)
Summary: Levothyroxine  Phone Note Refill Request Message from:  Fax from Pharmacy on January 10, 2010 11:08 AM  Refills Requested: Medication #1:  SYNTHROID 88 MCG TABS one tablet by mouth daily   Dosage confirmed as above?Dosage Confirmed   Brand Name Necessary? No   Supply Requested: 1 month   Last Refilled: 12/12/2009  Method Requested: Electronic Next Appointment Scheduled: No future appointments Initial call taken by: Glendell Docker CMA,  January 10, 2010 11:09 AM  Follow-up for Phone Call        ok for #30 w/ 6 refills Follow-up by: Neena Rhymes MD,  January 10, 2010 3:59 PM    Prescriptions: SYNTHROID 88 MCG TABS (LEVOTHYROXINE SODIUM) one tablet by mouth daily  #30 x 6   Entered and Authorized by:   Neena Rhymes MD   Signed by:   Neena Rhymes MD on 01/10/2010   Method used:   Electronically to        [facility] # [phone]* (retail)       [address] [place], [place]  [address]       Ph: [phone]       Fax: [phone]   RxID:   [id number]

## 2010-08-06 NOTE — Progress Notes (Signed)
Summary: bone density  Phone Note Outgoing Call   Call placed by: Doristine Devoid,  October 17, 2009 4:30 PM Call placed to: Patient Summary of Call: please notify pt that bone density is normal.  Follow-up for Phone Call        left message on machine .Marland KitchenMarland KitchenMarland KitchenDoristine Devoid  October 17, 2009 4:30 PM   patient aware of results..........Marland KitchenDoristine Devoid  October 18, 2009 1:20 PM

## 2010-08-06 NOTE — Progress Notes (Signed)
Summary: Thyroid Recheck  Phone Note Call from Patient   Caller: Patient Summary of Call: Pt states that she would like to have her thyroid rechecked this month. It was normal last month but she wants to have it rechecked. Please advise with the order. [phone] Initial call taken by: Lavell Islam,  January 16, 2010 10:21 AM  Follow-up for Phone Call        not standard for [place] to do monthly thyroid labs.  if she is having a change in her sxs she will need an office visit.  otherwise will check every 6 months Follow-up by: Neena Rhymes MD,  January 16, 2010 12:47 PM  Additional Follow-up for Phone Call Additional follow up Details #1::        Patient notified and states that her sxms have changed, and she will make an appt. Additional Follow-up by: Lucious Groves CMA,  January 16, 2010 4:17 PM

## 2010-08-06 NOTE — Letter (Signed)
Summary: [facility] Instructions  [facility]  [address] [place], [place] [address]   Phone: [phone]  Fax: [phone]       [name] [name]    1960-12-17    MRN: [id number]        Procedure Day /Date: Tuesday 04-09-10     Arrival Time: 1:30 p.m.     Procedure Time: 2:30 p.m.     Location of Procedure:                    _x _  [facility] (4th Floor)   PREPARATION FOR COLONOSCOPY WITH MOVIPREP   Starting 5 days prior to your procedure  04-04-10 do not eat nuts, seeds, popcorn, corn, beans, peas,  salads, or any raw vegetables.  Do not take any fiber supplements (e.g. Metamucil, Citrucel, and Benefiber).  THE DAY BEFORE YOUR PROCEDURE         DATE:  04-08-10  DAY:  Monday  1.  Drink clear liquids the entire day-NO SOLID FOOD  2.  Do not drink anything colored red or purple.  Avoid juices with pulp.  No orange juice.  3.  Drink at least 64 oz. (8 glasses) of fluid/clear liquids during the day to prevent dehydration and help the prep work efficiently.  CLEAR LIQUIDS INCLUDE: Water Jello Ice Popsicles Tea (sugar ok, no milk/cream) Powdered fruit flavored drinks Coffee (sugar ok, no milk/cream) Gatorade Juice: apple, white grape, white cranberry  Lemonade Clear bullion, consomm, broth Carbonated beverages (any kind) Strained chicken noodle soup Hard Candy                             4.  In the morning, mix first dose of MoviPrep solution:    Empty 1 Pouch A and 1 Pouch B into the disposable container    Add lukewarm drinking water to the top line of the container. Mix to dissolve    Refrigerate (mixed solution should be used within 24 hrs)  5.  Begin drinking the prep at 5:00 p.m. The MoviPrep container is divided by 4 marks.   Every 15 minutes drink the solution down to the next mark (approximately 8 oz) until the full liter is complete.   6.  Follow completed prep with 16 oz of clear liquid of your choice (Nothing red or  purple).  Continue to drink clear liquids until bedtime.  7.  Before going to bed, mix second dose of MoviPrep solution:    Empty 1 Pouch A and 1 Pouch B into the disposable container    Add lukewarm drinking water to the top line of the container. Mix to dissolve    Refrigerate  THE DAY OF YOUR PROCEDURE      DATE:  04-09-10  DAY:  Tuesday  Beginning at 9:30 a.m. (5 hours before procedure):         1. Every 15 minutes, drink the solution down to the next mark (approx 8 oz) until the full liter is complete.  2. Follow completed prep with 16 oz. of clear liquid of your choice.    3. You may drink clear liquids until  12:30 p.m. (2 HOURS BEFORE PROCEDURE).   MEDICATION INSTRUCTIONS  Unless otherwise instructed, you should take regular prescription medications with a small sip of water   as early as possible the morning of your procedure.        OTHER INSTRUCTIONS  You will  need a responsible adult at least 52 years of age to accompany you and drive you home.   This person must remain in the waiting room during your procedure.  Wear loose fitting clothing that is easily removed.  Leave jewelry and other valuables at home.  However, you may wish to bring a book to read or  an iPod/MP3 player to listen to music as you wait for your procedure to start.  Remove all body piercing jewelry and leave at home.  Total time from sign-in until discharge is approximately 2-3 hours.  You should go home directly after your procedure and rest.  You can resume normal activities the  day after your procedure.  The day of your procedure you should not:   Drive   Make legal decisions   Operate machinery   Drink alcohol   Return to work  You will receive specific instructions about eating, activities and medications before you leave.    The above instructions have been reviewed and explained to me by   Karl Bales RN  April 01, 2010 1:06 PM    I fully understand  and can verbalize these instructions _____________________________ Date _________

## 2010-08-06 NOTE — Assessment & Plan Note (Signed)
Summary: f/u on Thyroid. Feels like it may be Hyper - jr   Vital Signs:  Patient profile:   50 year old female Weight:      162.8 pounds Pulse rate:   70 / minute BP sitting:   110 / 64  (left arm)  Vitals Entered By: Doristine Devoid (July 10, 2009 10:46 AM) CC: feels like she is experiencing problems w/ thyroid    Primary Care Provider:  Neena Rhymes MD  CC:  feels like she is experiencing problems w/ thyroid .  History of Present Illness: [name] [name] is a 50 year old female who history of graves disease, s/p RIA 72.  Today patient presents with c/o heart racing,  heat intolerance and insomnia.  Notes that she has also gained weight since last visit.    Allergies: 1)  ! Aspirin  Review of Systems       + heat intolerance, + palptations,  feels "jittery", + weight gain. neuro- also notes that she develops intemitent "falling asleep" of left arm and leg.  Has happened several times in the last few days.  Denies associated facial droop, or slurred speech.    Physical Exam  General:  Well-developed,well-nourished,in no acute distress; alert,appropriate and cooperative throughout examination Head:  Normocephalic and atraumatic without obvious abnormalities. No apparent alopecia or balding. Eyes:  No corneal or conjunctival inflammation noted. EOMI. Perrla. Funduscopic exam benign, without hemorrhages, exudates or papilledema. Vision grossly normal. Neck:  No deformities, masses, or tenderness noted. Lungs:  Normal respiratory effort, chest expands symmetrically. Lungs are clear to auscultation, no crackles or wheezes. Heart:  Normal rate and regular rhythm. S1 and S2 normal without gallop, murmur, click, rub or other extra sounds. Abdomen:  Bowel sounds positive,abdomen soft and non-tender without masses, organomegaly or hernias noted. Msk:  No deformity or scoliosis noted of thoracic or lumbar spine.   Neurologic:  alert & oriented X3, cranial nerves II-XII intact, strength  normal in all extremities, and gait normal.   Skin:  Intact without suspicious lesions or rashes Psych:  Oriented X3, memory intact for recent and remote, normally interactive, and good eye contact.     Impression & Recommendations:  Problem # 1:  HYPOTHYROIDISM (ICD-244.9)  Her updated medication list for this problem includes:    Synthroid 150 Mcg Tabs (Levothyroxine sodium) .Marland Kitchen... 1 by mouth once daily  Orders: TLB-TSH (Thyroid Stimulating Hormone) (84443-TSH)  Problem # 2:  PARESTHESIA (ICD-782.0) Assessment: Deteriorated Patient with intermittent L arm and left leg numbness-  Mom with history of stroke- died at age 4.  I am concerned about the possiblitity that patient may have ongoing ischemic changes.  Had MRI back in 2009- which was negative. Will start with CT head.  Will also check FLP and B12/folate.   Orders: Venipuncture (11914) TLB-B12 + Folate Pnl (78295_62130-Q65/HQI) Misc. Referral (Misc. Ref)  Complete Medication List: 1)  Synthroid 150 Mcg Tabs (Levothyroxine sodium) .Marland Kitchen.. 1 by mouth once daily 2)  Zyrtec Allergy 10 Mg Tabs (Cetirizine hcl) .... During season once daily 3)  Valtrex 500 Mg Tabs (Valacyclovir hcl) .... Take one tablet as needed  Patient Instructions: 1)  Please complete your CT head today.   2)  I will call you with your results. 3)  Should you develop facial drooping or left sided weakness please go directly to the ER. 4)  Please return at your earliest convenience for a fating lipid profile- V70

## 2010-08-06 NOTE — Letter (Signed)
Summary: Pre Visit Letter Revised  [facility]  [address] [place], [place] [address]   Phone: [phone]  Fax: [phone]        03/20/2010 MRN: [id number]   [facility] [address] [place], [place]  [address]              Welcome to the Gastroenterology Division at [facility].    You are scheduled to see a nurse for your pre-procedure visit on March 29, 2010 at 1:30pm on the 3rd floor at [facility], [address].  We ask that you try to arrive at our office 15 minutes prior to your appointment time to allow for check-in.  Please take a minute to review the attached form.  If you answer "Yes" to one or more of the questions on the first page, we ask that you call the person listed at your earliest opportunity.  If you answer "No" to all of the questions, please complete the rest of the form and bring it to your appointment.    Your nurse visit will consist of discussing your medical and surgical history, your immediate family medical history, and your medications.   If you are unable to list all of your medications on the form, please bring the medication bottles to your appointment and we will list them.  We will need to be aware of both prescribed and over the counter drugs.  We will need to know exact dosage information as well.    Please be prepared to read and sign documents such as consent forms, a financial agreement, and acknowledgement forms.  If necessary, and with your consent, a friend or relative is welcome to sit-in on the nurse visit with you.  Please bring your insurance card so that we may make a copy of it.  If your insurance requires a referral to see a specialist, please bring your referral form from your primary care physician.  No co-pay is required for this nurse visit.     If you cannot keep your appointment, please call [phone] to cancel or reschedule prior to your appointment date.  This allows [place]  the opportunity to schedule an appointment for another patient in need of care.    Thank you for choosing [facility] for your medical needs.  We appreciate the opportunity to care for you.  Please visit [place] at our website  to learn more about our practice.  Sincerely, The Gastroenterology Division

## 2010-08-08 ENCOUNTER — Telehealth (INDEPENDENT_AMBULATORY_CARE_PROVIDER_SITE_OTHER): Payer: Self-pay | Admitting: *Deleted

## 2010-08-08 NOTE — Consult Note (Signed)
Summary: [facility]  [facility]   Imported By: Lanelle Bal 07/22/2010 10:03:34  _____________________________________________________________________  External Attachment:    Type:   Image     Comment:   External Document

## 2010-08-14 NOTE — Progress Notes (Signed)
Summary: refill  Phone Note Refill Request Message from:  Fax from Pharmacy on August 08, 2010 2:08 PM  Refills Requested: Medication #1:  SYNTHROID 80 MCG TABS one tablet by mouth daily [facility] - fax [phone] - phone - [phone]  Initial call taken by: Okey Regal [place],  August 08, 2010 2:08 PM    Prescriptions: SYNTHROID 88 MCG TABS (LEVOTHYROXINE SODIUM) one tablet by mouth daily  #30 x 6   Entered by:   Doristine Devoid CMA   Authorized by:   Neena Rhymes MD   Signed by:   Doristine Devoid CMA on 08/08/2010   Method used:   Electronically to        [facility] #[id number]* (retail)       [address]       [place], [place]  [address]       Ph: [phone]       Fax: [phone]   RxID:   [id number]

## 2010-08-22 ENCOUNTER — Ambulatory Visit (INDEPENDENT_AMBULATORY_CARE_PROVIDER_SITE_OTHER): Payer: Managed Care, Other (non HMO) | Admitting: Family Medicine

## 2010-08-22 ENCOUNTER — Other Ambulatory Visit: Payer: Self-pay | Admitting: Family Medicine

## 2010-08-22 ENCOUNTER — Encounter: Payer: Self-pay | Admitting: Family Medicine

## 2010-08-22 DIAGNOSIS — E538 Deficiency of other specified B group vitamins: Secondary | ICD-10-CM

## 2010-08-22 DIAGNOSIS — R5381 Other malaise: Secondary | ICD-10-CM

## 2010-08-22 DIAGNOSIS — F329 Major depressive disorder, single episode, unspecified: Secondary | ICD-10-CM

## 2010-08-22 DIAGNOSIS — F3289 Other specified depressive episodes: Secondary | ICD-10-CM

## 2010-08-22 DIAGNOSIS — R5383 Other fatigue: Secondary | ICD-10-CM

## 2010-08-22 DIAGNOSIS — E039 Hypothyroidism, unspecified: Secondary | ICD-10-CM

## 2010-08-22 LAB — TSH: TSH: 2.17 u[IU]/mL (ref 0.35–5.50)

## 2010-08-28 ENCOUNTER — Ambulatory Visit: Payer: Self-pay | Admitting: Family Medicine
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
## 2010-09-10 ENCOUNTER — Encounter: Payer: Self-pay | Admitting: Family Medicine

## 2010-09-20 ENCOUNTER — Encounter: Payer: Self-pay | Admitting: Family Medicine

## 2010-09-20 ENCOUNTER — Ambulatory Visit (INDEPENDENT_AMBULATORY_CARE_PROVIDER_SITE_OTHER): Payer: Managed Care, Other (non HMO) | Admitting: Family Medicine

## 2010-09-20 DIAGNOSIS — F329 Major depressive disorder, single episode, unspecified: Secondary | ICD-10-CM

## 2010-09-20 DIAGNOSIS — J019 Acute sinusitis, unspecified: Secondary | ICD-10-CM

## 2010-09-20 DIAGNOSIS — F3289 Other specified depressive episodes: Secondary | ICD-10-CM

## 2010-09-24 NOTE — Assessment & Plan Note (Signed)
Summary: congested pt wiil reschedule - return visit-  rto 1 month/cbs   Vital Signs:  Patient profile:   50 year old female Height:      62 inches (157.48 cm) Weight:      158.38 pounds (71.99 kg) BMI:     29.07 Temp:     98.6 degrees F (37.00 degrees C) oral BP sitting:   100 / 70  (left arm) Cuff size:   regular  Vitals Entered By: Lucious Groves CMA (September 20, 2010 1:04 PM) CC: Possible sinus infection and need right side of nose looked at./kb Is Patient Diabetic? No Pain Assessment Patient in pain? yes     Location: nose Intensity: sore to touch Comments Patient notes that she has been having HA, congestion, and cough producing light gray mucous. Her nose is very red and swollen and she needs that looked at also.  Patient is unsure of fever. OTC cough meds have not helped at all.   History of Present Illness: 50 yo woman here today for ? sinus infxn.  sxs started Monday night w/ excessive sneezing.  having R sided facial pain, nasal swelling, redness, warm to touch.  + nasal congestion, cough, subjective fevers.  + sick contacts.  depression- hasn't noticed a difference since starting meds.  open to idea of increasing dose.  Current Medications (verified): 1)  Synthroid 88 Mcg Tabs (Levothyroxine Sodium) .... One Tablet By Mouth Daily 2)  Zyrtec Allergy 10 Mg  Tabs (Cetirizine Hcl) .... During Season Once Daily 3)  Valtrex 500 Mg Tabs (Valacyclovir Hcl) .... Take One Tablet As Needed 4)  Zoloft 100 Mg Tabs (Sertraline Hcl) .Marland Kitchen.. 1 Tab Daily 5)  Augmentin 875-125 Mg Tabs (Amoxicillin-Pot Clavulanate) .Marland Kitchen.. 1 By Mouth 2 Times Daily X10 Days W/ Food. 6)  Cheratussin Ac 100-10 Mg/71ml Syrp (Guaifenesin-Codeine) .Marland Kitchen.. 1-2 Tsps Q4-6 As Needed For Cough  Allergies (verified): 1)  ! Aspirin  Review of Systems      See HPI  Physical Exam  General:  obviously not feeling well Head:  NCAT, + TTP over frontal and maxillary sinuses Eyes:  no injxn or inflammation, no TTP of  orbit Ears:  External ear exam shows no significant lesions or deformities.  Otoscopic examination reveals clear canals, tympanic membranes are intact bilaterally without bulging, retraction, inflammation or discharge. Hearing is grossly normal bilaterally. Nose:  R side of nose swollen, mildly erythematous, TTP nostrils show turbinate edema bilaterally Mouth:  Oral mucosa and oropharynx without lesions or exudates.  Teeth in good repair. Neck:  No deformities, masses, or tenderness noted. No thyroid enlargement of masses noted on exam.  Lungs:  Normal respiratory effort, chest expands symmetrically. Lungs are clear to auscultation, no crackles or wheezes.  + dry cough Heart:  Normal rate and regular rhythm. S1 and S2 normal without gallop, murmur, click, rub or other extra sounds.   Impression & Recommendations:  Problem # 1:  SINUSITIS - ACUTE-NOS (ICD-461.9) Assessment New pt's sxs and PE consistent w/ infxn.  start Augmentin.  reviewed supportive care and red flags that should prompt return.  Pt expresses understanding and is in agreement w/ this plan. Her updated medication list for this problem includes:    Augmentin 875-125 Mg Tabs (Amoxicillin-pot clavulanate) .Marland Kitchen... 1 by mouth 2 times daily x10 days w/ food.    Cheratussin Ac 100-10 Mg/31ml Syrp (Guaifenesin-codeine) .Marland Kitchen... 1-2 tsps q4-6 as needed for cough  Problem # 2:  DEPRESSION (ICD-311) Assessment: Unchanged increase zoloft to 100mg  daily Her  updated medication list for this problem includes:    Zoloft 100 Mg Tabs (Sertraline hcl) .Marland Kitchen... 1 tab daily  Complete Medication List: 1)  Synthroid 88 Mcg Tabs (Levothyroxine sodium) .... One tablet by mouth daily 2)  Zyrtec Allergy 10 Mg Tabs (Cetirizine hcl) .... During season once daily 3)  Valtrex 500 Mg Tabs (Valacyclovir hcl) .... Take one tablet as needed 4)  Zoloft 100 Mg Tabs (Sertraline hcl) .Marland Kitchen.. 1 tab daily 5)  Augmentin 875-125 Mg Tabs (Amoxicillin-pot clavulanate) .Marland Kitchen.. 1  by mouth 2 times daily x10 days w/ food. 6)  Cheratussin Ac 100-10 Mg/45ml Syrp (Guaifenesin-codeine) .Marland Kitchen.. 1-2 tsps q4-6 as needed for cough  Patient Instructions: 1)  Follow up in 1 month to recheck mood 2)  Take 2 of the Zoloft you have at home- new prescription will be 1 tab daily 3)  This is a sinus infection 4)  Take the Augmentin as directed- take w/ food to avoid upset stomach 5)  Tylenol or ibuprofen as needed for pain or fever 6)  Drink plenty of fluids 7)  Cough syrup as needed 8)  If the pain, swelling, or redness of the nose worsens- please call or go to the ER 9)  Hang in there!!! Prescriptions: ZOLOFT 100 MG TABS (SERTRALINE HCL) 1 tab daily  #30 x 3   Entered and Authorized by:   Neena Rhymes MD   Signed by:   Neena Rhymes MD on 09/20/2010   Method used:   Electronically to        [facility] #[id number]* (retail)       [address]       [place], [place]  [address]       Ph: [phone]       Fax: [phone]   RxID:   [id number] CHERATUSSIN AC 100-10 MG/5ML SYRP (GUAIFENESIN-CODEINE) 1-2 tsps Q4-6 as needed for cough  #137ml x 0   Entered and Authorized by:   Neena Rhymes MD   Signed by:   Neena Rhymes MD on 09/20/2010   Method used:   Print then Give to Patient   RxID:   [id number] AUGMENTIN 875-125 MG TABS (AMOXICILLIN-POT CLAVULANATE) 1 by mouth 2 times daily x10 days w/ food.  #20 x 3   Entered and Authorized by:   Neena Rhymes MD   Signed by:   Neena Rhymes MD on 09/20/2010   Method used:   Electronically to        [facility] #[id number]* (retail)       [address]       [place], [place]  [address]       Ph: [phone]       Fax: [phone]   RxID:   [id number]    Orders Added: 1)  Est. Patient Level III [62694]

## 2010-10-11 ENCOUNTER — Encounter: Payer: Self-pay | Admitting: Family Medicine

## 2010-10-21 ENCOUNTER — Ambulatory Visit: Payer: Managed Care, Other (non HMO) | Admitting: Family Medicine

## 2010-10-31 ENCOUNTER — Ambulatory Visit (INDEPENDENT_AMBULATORY_CARE_PROVIDER_SITE_OTHER): Payer: Managed Care, Other (non HMO) | Admitting: Family Medicine

## 2010-10-31 VITALS — BP 104/62 | Temp 98.5°F | Wt 157.2 lb

## 2010-10-31 DIAGNOSIS — F329 Major depressive disorder, single episode, unspecified: Secondary | ICD-10-CM

## 2010-10-31 DIAGNOSIS — F3289 Other specified depressive episodes: Secondary | ICD-10-CM

## 2010-10-31 MED ORDER — CLONAZEPAM 1 MG PO TABS: 1.0000 mg | ORAL_TABLET | Freq: Three times a day (TID) | ORAL | Status: DC

## 2010-10-31 MED ORDER — SERTRALINE HCL 100 MG PO TABS: 200.0000 mg | ORAL_TABLET | Freq: Every day | ORAL | Status: DC

## 2010-10-31 MED ORDER — SERTRALINE HCL 100 MG PO TABS: 100.0000 mg | ORAL_TABLET | Freq: Every day | ORAL | Status: DC

## 2010-10-31 NOTE — Patient Instructions (Signed)
Follow up in 1 month to recheck mood Take 2 of the Zoloft that you have- when you pick up your new prescription it will be just 1 pill Use the Klonopin as needed for anxiety and/or sleep Call with any questions or concerns Hang in there!!!!

## 2010-10-31 NOTE — Progress Notes (Signed)
  Subjective:    Patient ID: [name] [name], female    DOB: 12/02/1960, 50 y.o.   MRN: [id number]  HPI Depression f/u- Zoloft was working well until dog of 11 yrs died last week.  This was who pt 'poured my emotions into' after mom's death and after son's suicide.  Feeling guilty about putting dog to sleep despite the vet telling her it was the best thing.  Now very lonely at home.  'i feel like i lost my mom and my son all over again'.   Review of Systems For ROS see HPI     Objective:   Physical Exam  Constitutional: She appears well-developed and well-nourished.       tearful  Psychiatric:       Very upset when talking about her dog- tearful throughout visit          Assessment & Plan:

## 2010-11-10 NOTE — Assessment & Plan Note (Signed)
Increase pt's SSRI to assist w/ depression and wean as able.  Start Klonopin prn for sleep and anxiety.  Reviewed supportive care and red flags that should prompt return.  Pt expressed understanding and is in agreement w/ plan.

## 2010-11-29 ENCOUNTER — Encounter: Payer: Self-pay | Admitting: Family Medicine

## 2010-11-29 ENCOUNTER — Ambulatory Visit (INDEPENDENT_AMBULATORY_CARE_PROVIDER_SITE_OTHER): Payer: Managed Care, Other (non HMO) | Admitting: Family Medicine

## 2010-11-29 VITALS — BP 100/76 | HR 65 | Temp 98.1°F | Wt 157.0 lb

## 2010-11-29 DIAGNOSIS — F329 Major depressive disorder, single episode, unspecified: Secondary | ICD-10-CM

## 2010-11-29 DIAGNOSIS — F3289 Other specified depressive episodes: Secondary | ICD-10-CM

## 2010-11-29 NOTE — Patient Instructions (Signed)
Follow up in 1 month to recheck mood YOU ARE WORTH IT! If you need me you call me! Hang in there!!!
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
## 2010-12-03 MED ORDER — SERTRALINE HCL 100 MG PO TABS: 200.0000 mg | ORAL_TABLET | Freq: Every day | ORAL | Status: DC

## 2010-12-03 NOTE — Assessment & Plan Note (Signed)
Pt's sxs are severe.  In some ways feels sxs are improving- sleeping well, no longer crying- but in other ways, sxs are much worse as pt now feels she has no purpose.  Pt able to contract for safety.  Currently in counseling, on meds.  Will follow closely.

## 2010-12-06 ENCOUNTER — Other Ambulatory Visit: Payer: Self-pay | Admitting: Family Medicine

## 2010-12-06 MED ORDER — CLONAZEPAM 1 MG PO TABS: 1.0000 mg | ORAL_TABLET | Freq: Three times a day (TID) | ORAL | Status: DC

## 2010-12-06 NOTE — Telephone Encounter (Signed)
Last refilled on 4/26. Please advise.

## 2010-12-06 NOTE — Telephone Encounter (Signed)
Done

## 2010-12-06 NOTE — Telephone Encounter (Signed)
Ok for 1 month, no refills 

## 2010-12-30 ENCOUNTER — Ambulatory Visit (INDEPENDENT_AMBULATORY_CARE_PROVIDER_SITE_OTHER): Payer: Managed Care, Other (non HMO) | Admitting: Family Medicine

## 2010-12-30 ENCOUNTER — Encounter: Payer: Self-pay | Admitting: Family Medicine

## 2010-12-30 DIAGNOSIS — F329 Major depressive disorder, single episode, unspecified: Secondary | ICD-10-CM

## 2010-12-30 DIAGNOSIS — F3289 Other specified depressive episodes: Secondary | ICD-10-CM

## 2010-12-30 MED ORDER — ALPRAZOLAM 0.25 MG PO TABS: 0.2500 mg | ORAL_TABLET | Freq: Three times a day (TID) | ORAL | Status: DC | PRN

## 2010-12-30 NOTE — Patient Instructions (Signed)
Follow up in 6-8 weeks to recheck depression Take the Alprazolam as needed for stress/anxiety Continue the Klonopin before bed Continue the Zoloft Continue w/ therapy Call with any questions or concerns Hang in there!!

## 2010-12-30 NOTE — Progress Notes (Signed)
  Subjective:    Patient ID: [name] [name], female    DOB: 05/26/1961, 50 y.o.   MRN: [id number]  HPI Depression- pt admits that she is 'in a clinical depression'.  Got 2nd job to avoid being home and staying busy.  Husband doesn't want her on Zoloft- told her that it's her thyroid or B12.  Poor diet- eating cereal, drinking soda, ice cream.  Is in counseling.  Feels like she is in a better place than last visit- 'i'm starting to open the blinds'.   Review of Systems For ROS see HPI     Objective:   Physical Exam  Constitutional: She appears well-developed and well-nourished.  Psychiatric:       Anxious, fidgety.  Better eye contact today, more engaged in conversation.          Assessment & Plan:

## 2010-12-31 NOTE — Assessment & Plan Note (Signed)
Pt can finally admit her depression.  Stressed that despite what her husband thinks, she does need the SSRI- pt agrees.  Applauded her counseling efforts- plan is to continue.  Pt able to contract for safety again today.  Will continue to follow closely.

## 2011-01-17 ENCOUNTER — Other Ambulatory Visit: Payer: Self-pay | Admitting: Family Medicine

## 2011-01-17 MED ORDER — ALPRAZOLAM 0.25 MG PO TABS: 0.2500 mg | ORAL_TABLET | Freq: Three times a day (TID) | ORAL | Status: DC | PRN

## 2011-01-17 MED ORDER — CLONAZEPAM 1 MG PO TABS: 1.0000 mg | ORAL_TABLET | Freq: Three times a day (TID) | ORAL | Status: DC

## 2011-01-17 NOTE — Telephone Encounter (Signed)
Clonazepam was last refilled 6/1 and Alprazolam last refilled 6/25. Please advise.

## 2011-01-17 NOTE — Telephone Encounter (Signed)
Done

## 2011-01-17 NOTE — Telephone Encounter (Signed)
Ok for both

## 2011-02-10 ENCOUNTER — Ambulatory Visit: Payer: Managed Care, Other (non HMO) | Admitting: Family Medicine

## 2011-02-11 ENCOUNTER — Ambulatory Visit (INDEPENDENT_AMBULATORY_CARE_PROVIDER_SITE_OTHER): Payer: Managed Care, Other (non HMO) | Admitting: Family Medicine

## 2011-02-11 DIAGNOSIS — F3289 Other specified depressive episodes: Secondary | ICD-10-CM

## 2011-02-11 DIAGNOSIS — F329 Major depressive disorder, single episode, unspecified: Secondary | ICD-10-CM

## 2011-02-11 DIAGNOSIS — R5381 Other malaise: Secondary | ICD-10-CM

## 2011-02-11 DIAGNOSIS — R5383 Other fatigue: Secondary | ICD-10-CM

## 2011-02-11 LAB — CBC WITH DIFFERENTIAL/PLATELET
Basophils Absolute: 0 10*3/uL (ref 0.0–0.1)
HCT: 39.4 % (ref 36.0–46.0)
Lymphs Abs: 1.7 10*3/uL (ref 0.7–4.0)
Monocytes Relative: 7.8 % (ref 3.0–12.0)
Neutrophils Relative %: 59.2 % (ref 43.0–77.0)
Platelets: 188 10*3/uL (ref 150.0–400.0)
RDW: 14.7 % — ABNORMAL HIGH (ref 11.5–14.6)

## 2011-02-11 LAB — TSH: TSH: 3.65 u[IU]/mL (ref 0.35–5.50)

## 2011-02-11 LAB — VITAMIN B12: Vitamin B-12: 442 pg/mL (ref 211–911)

## 2011-02-11 MED ORDER — SERTRALINE HCL 100 MG PO TABS: 200.0000 mg | ORAL_TABLET | Freq: Every day | ORAL | Status: DC

## 2011-02-11 NOTE — Progress Notes (Signed)
  Subjective:    Patient ID: [name] [name], female    DOB: 12/20/1960, 50 y.o.   MRN: [id number]  HPI Depression- 'it feels like it's getting better'.  Still in therapy and feels advice is beneficial.  Will take klonopin only as needed.  Hasn't worked 2nd job in 'a couple weeks' but finds herself 'very very tired'.  On Zoloft 200mg .  Doesn't want to take xanax regularly b/c she fears increased fatigue.  Reports the fatigue has been 'incredible' for the last 2 weeks.  Sat down and talked w/ husband- he's being more supportive.  Reports she is 'waiting to the last minute to get ready for work', only wants to 'lie on the couch'.     Review of Systems For ROS see HPI     Objective:   Physical Exam  Constitutional: She is oriented to person, place, and time. She appears well-developed and well-nourished. No distress.  Neck: No thyromegaly present.  Neurological: She is alert and oriented to person, place, and time. No cranial nerve deficit. Coordination normal.  Skin: Skin is warm and dry.  Psychiatric: Judgment and thought content normal.       Anxious today- fidgety, constant motion          Assessment & Plan:

## 2011-02-11 NOTE — Patient Instructions (Signed)
We'll notify you of your lab results This may be another step in your healing process I'm so proud of the work you're doing!  Keep it up! Call with any questions or concerns Hang in there!

## 2011-02-12 NOTE — Progress Notes (Signed)
Pt aware of results 

## 2011-02-16 NOTE — Assessment & Plan Note (Signed)
Fatigue is most likely due to her ongoing depression but since she has hx of thyroid problems and vitamin deficiencies will check labs to r/o contributing factors.  Pt expressed understanding and is in agreement w/ plan.

## 2011-02-16 NOTE — Assessment & Plan Note (Signed)
Pt continues to struggle w/ this.  Likely the cause of her ongoing fatigue.  Encouraged her to continue her therapy as this seems to be helping.  Advised her to take the xanax prn for her anxiety.  Will continue to follow.

## 2011-02-28 ENCOUNTER — Other Ambulatory Visit: Payer: Self-pay | Admitting: Family Medicine

## 2011-02-28 MED ORDER — VALACYCLOVIR HCL 500 MG PO TABS: 500.0000 mg | ORAL_TABLET | ORAL | Status: DC | PRN

## 2011-02-28 NOTE — Telephone Encounter (Signed)
Last OV 02-11-11, last filled 02-07-10 #30-3

## 2011-02-28 NOTE — Telephone Encounter (Signed)
Ok for #30, w/ 3 refills

## 2011-02-28 NOTE — Telephone Encounter (Signed)
Rx sent 

## 2011-03-06 ENCOUNTER — Other Ambulatory Visit: Payer: Self-pay | Admitting: Family Medicine

## 2011-03-06 MED ORDER — LEVOTHYROXINE SODIUM 88 MCG PO TABS: 88.0000 ug | ORAL_TABLET | Freq: Every day | ORAL | Status: DC

## 2011-03-06 MED ORDER — ALPRAZOLAM 0.25 MG PO TABS: 0.2500 mg | ORAL_TABLET | Freq: Three times a day (TID) | ORAL | Status: DC | PRN

## 2011-03-06 NOTE — Telephone Encounter (Signed)
Done

## 2011-03-06 NOTE — Telephone Encounter (Signed)
Last ov 02/11/11. Last filled 01/17/11

## 2011-03-06 NOTE — Telephone Encounter (Signed)
Ok to refill thyroid med w/ 6 refills Ok to refill xanax x3

## 2011-03-19 ENCOUNTER — Other Ambulatory Visit: Payer: Self-pay | Admitting: Family Medicine

## 2011-03-19 MED ORDER — SERTRALINE HCL 100 MG PO TABS: ORAL_TABLET | ORAL | Status: DC

## 2011-04-08 ENCOUNTER — Telehealth: Payer: Self-pay | Admitting: Family

## 2011-04-08 ENCOUNTER — Ambulatory Visit (INDEPENDENT_AMBULATORY_CARE_PROVIDER_SITE_OTHER): Payer: Managed Care, Other (non HMO) | Admitting: Family

## 2011-04-08 ENCOUNTER — Encounter: Payer: Self-pay | Admitting: Family

## 2011-04-08 ENCOUNTER — Ambulatory Visit (HOSPITAL_BASED_OUTPATIENT_CLINIC_OR_DEPARTMENT_OTHER)
Admission: RE | Admit: 2011-04-08 | Discharge: 2011-04-08 | Disposition: A | Discharge status: Home / Self Care | Payer: Managed Care, Other (non HMO) | Source: Ambulatory Visit | Attending: Family | Admitting: Family

## 2011-04-08 VITALS — BP 110/80 | HR 90 | Temp 97.8°F | Resp 18 | Ht 62.01 in | Wt 158.1 lb

## 2011-04-08 DIAGNOSIS — R059 Cough, unspecified: Secondary | ICD-10-CM

## 2011-04-08 DIAGNOSIS — E039 Hypothyroidism, unspecified: Secondary | ICD-10-CM

## 2011-04-08 DIAGNOSIS — R05 Cough: Secondary | ICD-10-CM

## 2011-04-08 DIAGNOSIS — J40 Bronchitis, not specified as acute or chronic: Secondary | ICD-10-CM

## 2011-04-08 MED ORDER — GUAIFENESIN-CODEINE 100-10 MG/5ML PO SYRP: 5.0000 mL | ORAL_SOLUTION | Freq: Three times a day (TID) | ORAL | Status: DC | PRN

## 2011-04-08 MED ORDER — AZITHROMYCIN 250 MG PO TABS: ORAL_TABLET | ORAL | Status: AC

## 2011-04-08 NOTE — Telephone Encounter (Signed)
Reviewed cxr (bronchitis).  Called patient and advised her re: results and to complete zpak.

## 2011-04-08 NOTE — Assessment & Plan Note (Signed)
Pt instructed to contact us with the name of the Endocrinologist to whom she wishes to be referred.

## 2011-04-08 NOTE — Patient Instructions (Signed)
Please call us with the name of the endocrinologist that you would like to be referred to in [place]. Complete your chest x-ray on the first floor. Call us if you develop fever, shortness of breath,  if symptoms worsen or if you symptoms are not improved in 2-3 days.  Follow up with Dr. Beverely Low in 1 week.

## 2011-04-08 NOTE — Assessment & Plan Note (Signed)
Will plan to treat with zithromax.  Check CXR to exclude pneumonia. Add cheratussin for cough (cautioned her against use during the day if driving).  Pt is instructed to call/f/u as outlined in AVS.

## 2011-04-08 NOTE — Telephone Encounter (Signed)
Patient states that the endocrinologist that she has seen is Dr. Satira Mccallum on [facility] in [place] [phone].

## 2011-04-08 NOTE — Progress Notes (Signed)
Addended by: Sandford Craze on: 04/08/2011 04:59 PM   Modules accepted: Orders
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
## 2011-04-09 ENCOUNTER — Ambulatory Visit: Payer: Managed Care, Other (non HMO) | Admitting: Family Medicine

## 2011-04-16 ENCOUNTER — Ambulatory Visit (INDEPENDENT_AMBULATORY_CARE_PROVIDER_SITE_OTHER): Payer: Managed Care, Other (non HMO) | Admitting: Family

## 2011-04-16 ENCOUNTER — Encounter: Payer: Self-pay | Admitting: Family

## 2011-04-16 DIAGNOSIS — J45909 Unspecified asthma, uncomplicated: Secondary | ICD-10-CM

## 2011-04-16 MED ORDER — ALBUTEROL SULFATE HFA 108 (90 BASE) MCG/ACT IN AERS: 2.0000 | INHALATION_SPRAY | Freq: Four times a day (QID) | RESPIRATORY_TRACT | Status: DC | PRN

## 2011-04-16 MED ORDER — HYDROCOD POLST-CHLORPHEN POLST 10-8 MG/5ML PO LQCR: 5.0000 mL | Freq: Two times a day (BID) | ORAL | Status: DC | PRN

## 2011-04-16 MED ORDER — METHYLPREDNISOLONE SODIUM SUCC 125 MG IJ SOLR
125.0000 mg | Freq: Once | INTRAMUSCULAR | Status: AC
  Administered 2011-04-16: 125 mg via INTRAMUSCULAR

## 2011-04-16 MED ORDER — PREDNISONE 10 MG PO TABS: ORAL_TABLET | ORAL | Status: DC

## 2011-04-16 NOTE — Patient Instructions (Signed)
Call if you develop worsening cough, shortness of breath, fever over 101, or worsening headache/neck pain. Follow up in 1 week. Schedule your mammogram on the first floor today.

## 2011-04-16 NOTE — Assessment & Plan Note (Signed)
Deteriorated.  I suspect that her cough is due to bronchospasm following her recent bronchitis.  Pt was given a dose of solumedrol here in the office which will be followed by a round of prednisone and proventil Q6 hours for the next few days.  Will also give her tussionex to help with sleep.
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
## 2011-04-23 ENCOUNTER — Ambulatory Visit (HOSPITAL_BASED_OUTPATIENT_CLINIC_OR_DEPARTMENT_OTHER)
Admission: RE | Admit: 2011-04-23 | Discharge: 2011-04-23 | Disposition: A | Discharge status: Home / Self Care | Payer: Managed Care, Other (non HMO) | Source: Ambulatory Visit | Attending: Family | Admitting: Family

## 2011-04-23 DIAGNOSIS — Z1231 Encounter for screening mammogram for malignant neoplasm of breast: Secondary | ICD-10-CM

## 2011-04-23 DIAGNOSIS — R05 Cough: Secondary | ICD-10-CM | POA: Insufficient documentation

## 2011-04-23 DIAGNOSIS — R059 Cough, unspecified: Secondary | ICD-10-CM | POA: Insufficient documentation

## 2011-06-13 DIAGNOSIS — E890 Postprocedural hypothyroidism: Secondary | ICD-10-CM | POA: Insufficient documentation

## 2011-06-24 ENCOUNTER — Other Ambulatory Visit: Payer: Self-pay | Admitting: Family

## 2011-06-24 NOTE — Telephone Encounter (Signed)
Last OV 04-16-11 last refill 03-06-11 #45 with 3 refills

## 2011-06-25 MED ORDER — ALPRAZOLAM 0.25 MG PO TABS: 0.2500 mg | ORAL_TABLET | Freq: Three times a day (TID) | ORAL | Status: DC | PRN

## 2011-06-25 NOTE — Telephone Encounter (Signed)
Ok for #45, 3 refills 

## 2011-06-25 NOTE — Telephone Encounter (Signed)
.  rx faxed to pharmacy, manually.  

## 2011-06-27 ENCOUNTER — Other Ambulatory Visit: Payer: Self-pay | Admitting: Family Medicine

## 2011-06-27 NOTE — Telephone Encounter (Signed)
Noted fax from [facility] for xanax, noted current pharmacy in chart as noted [facility] was sent a fax for xanax yesterday 06-26-11 spoke to pt to verify the [facility] in [place], faxed to [place] and changed in chart

## 2011-07-08 DIAGNOSIS — C50919 Malignant neoplasm of unspecified site of unspecified female breast: Secondary | ICD-10-CM

## 2011-07-08 HISTORY — DX: Malignant neoplasm of unspecified site of unspecified female breast: C50.919

## 2011-08-20 ENCOUNTER — Other Ambulatory Visit: Payer: Self-pay | Admitting: Family Medicine

## 2011-08-20 NOTE — Telephone Encounter (Signed)
The pt is hoping to get a refill of Valacyclovir 500mg  tabs, qty 30 sent to [facility] in [place] Last filled on 07/27/11 Thanks!

## 2011-08-21 MED ORDER — VALACYCLOVIR HCL 500 MG PO TABS: 500.0000 mg | ORAL_TABLET | ORAL | Status: DC | PRN

## 2011-08-21 NOTE — Telephone Encounter (Signed)
rx sent to pharmacy by e-script  

## 2011-10-09 ENCOUNTER — Other Ambulatory Visit: Payer: Self-pay | Admitting: Family Medicine

## 2011-10-09 NOTE — Telephone Encounter (Signed)
Refill for  Valacyclovir 500MG  Tablets Qty 30 Take 1-tablet by mouth as needed Last filled 3.10.13

## 2011-10-10 MED ORDER — VALACYCLOVIR HCL 500 MG PO TABS: 500.0000 mg | ORAL_TABLET | ORAL | Status: DC | PRN

## 2011-10-10 NOTE — Telephone Encounter (Signed)
rx sent to pharmacy by e-script  

## 2012-01-05 ENCOUNTER — Ambulatory Visit (INDEPENDENT_AMBULATORY_CARE_PROVIDER_SITE_OTHER): Payer: Managed Care, Other (non HMO) | Admitting: Family

## 2012-01-05 ENCOUNTER — Encounter: Payer: Self-pay | Admitting: Family

## 2012-01-05 VITALS — BP 96/68 | HR 75 | Temp 97.5°F | Resp 16 | Ht 62.0 in | Wt 151.0 lb

## 2012-01-05 DIAGNOSIS — B009 Herpesviral infection, unspecified: Secondary | ICD-10-CM

## 2012-01-05 DIAGNOSIS — R5381 Other malaise: Secondary | ICD-10-CM

## 2012-01-05 DIAGNOSIS — R5383 Other fatigue: Secondary | ICD-10-CM

## 2012-01-05 DIAGNOSIS — F329 Major depressive disorder, single episode, unspecified: Secondary | ICD-10-CM

## 2012-01-05 DIAGNOSIS — F3289 Other specified depressive episodes: Secondary | ICD-10-CM

## 2012-01-05 DIAGNOSIS — E538 Deficiency of other specified B group vitamins: Secondary | ICD-10-CM

## 2012-01-05 LAB — CBC WITH DIFFERENTIAL/PLATELET
Basophils Absolute: 0 10*3/uL (ref 0.0–0.1)
Basophils Relative: 0 % (ref 0–1)
Eosinophils Absolute: 0.1 10*3/uL (ref 0.0–0.7)
HCT: 38.6 % (ref 36.0–46.0)
MCHC: 33.4 g/dL (ref 30.0–36.0)
Monocytes Absolute: 0.4 10*3/uL (ref 0.1–1.0)
Neutro Abs: 3.2 10*3/uL (ref 1.7–7.7)
RDW: 14.2 % (ref 11.5–15.5)

## 2012-01-05 LAB — FOLATE: Folate: >20 ng/mL

## 2012-01-05 MED ORDER — ALPRAZOLAM 0.25 MG PO TABS: 0.2500 mg | ORAL_TABLET | Freq: Every evening | ORAL | Status: DC | PRN

## 2012-01-05 MED ORDER — VALACYCLOVIR HCL 500 MG PO TABS: 500.0000 mg | ORAL_TABLET | Freq: Every day | ORAL | Status: DC

## 2012-01-05 NOTE — Assessment & Plan Note (Signed)
Check b12, folate.  Defer TSH to endo.  Check CBC.

## 2012-01-05 NOTE — Patient Instructions (Addendum)
Please schedule fasting physical at the front desk.  

## 2012-01-05 NOTE — Assessment & Plan Note (Signed)
Currently stable.  Continues 1/2 to 1 tab of xanax at bedtime.  Monitor.
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
## 2012-01-05 NOTE — Assessment & Plan Note (Signed)
Obtain b12, folate level.  If b12 low, plan to restart b12 injections.

## 2012-01-05 NOTE — Assessment & Plan Note (Signed)
Reports that she has been using valtrex once daily to suppress recurrent hsv on her buttock.  Reports well controlled.

## 2012-02-24 ENCOUNTER — Ambulatory Visit (HOSPITAL_BASED_OUTPATIENT_CLINIC_OR_DEPARTMENT_OTHER)
Admission: RE | Admit: 2012-02-24 | Discharge: 2012-02-24 | Disposition: A | Discharge status: Home / Self Care | Payer: Managed Care, Other (non HMO) | Source: Ambulatory Visit | Attending: Family | Admitting: Family

## 2012-02-24 ENCOUNTER — Other Ambulatory Visit (HOSPITAL_COMMUNITY)
Admission: RE | Admit: 2012-02-24 | Discharge: 2012-02-24 | Disposition: A | Discharge status: Home / Self Care | Payer: Managed Care, Other (non HMO) | Source: Ambulatory Visit | Attending: Family | Admitting: Family

## 2012-02-24 ENCOUNTER — Ambulatory Visit (INDEPENDENT_AMBULATORY_CARE_PROVIDER_SITE_OTHER): Payer: Managed Care, Other (non HMO) | Admitting: Family

## 2012-02-24 ENCOUNTER — Encounter: Payer: Self-pay | Admitting: Family

## 2012-02-24 VITALS — BP 100/70 | HR 72 | Temp 98.0°F | Resp 14 | Ht 63.0 in | Wt 152.0 lb

## 2012-02-24 DIAGNOSIS — R209 Unspecified disturbances of skin sensation: Secondary | ICD-10-CM

## 2012-02-24 DIAGNOSIS — R200 Anesthesia of skin: Secondary | ICD-10-CM | POA: Insufficient documentation

## 2012-02-24 DIAGNOSIS — R05 Cough: Secondary | ICD-10-CM | POA: Insufficient documentation

## 2012-02-24 DIAGNOSIS — Z87891 Personal history of nicotine dependence: Secondary | ICD-10-CM | POA: Insufficient documentation

## 2012-02-24 DIAGNOSIS — Z136 Encounter for screening for cardiovascular disorders: Secondary | ICD-10-CM

## 2012-02-24 DIAGNOSIS — Z01419 Encounter for gynecological examination (general) (routine) without abnormal findings: Secondary | ICD-10-CM

## 2012-02-24 DIAGNOSIS — Z0000 Encounter for general adult medical examination without abnormal findings: Secondary | ICD-10-CM | POA: Insufficient documentation

## 2012-02-24 DIAGNOSIS — R059 Cough, unspecified: Secondary | ICD-10-CM

## 2012-02-24 DIAGNOSIS — E039 Hypothyroidism, unspecified: Secondary | ICD-10-CM

## 2012-02-24 LAB — CBC WITH DIFFERENTIAL/PLATELET
Basophils Absolute: 0 10*3/uL (ref 0.0–0.1)
Basophils Relative: 0 % (ref 0–1)
Eosinophils Absolute: 0.1 10*3/uL (ref 0.0–0.7)
Eosinophils Relative: 2 % (ref 0–5)
HCT: 38.6 % (ref 36.0–46.0)
MCH: 28.9 pg (ref 26.0–34.0)
MCHC: 34.5 g/dL (ref 30.0–36.0)
MCV: 83.9 fL (ref 78.0–100.0)
Monocytes Absolute: 0.5 10*3/uL (ref 0.1–1.0)
RDW: 13.3 % (ref 11.5–15.5)

## 2012-02-24 LAB — HEPATIC FUNCTION PANEL
AST: 16 U/L (ref 0–37)
Alkaline Phosphatase: 92 U/L (ref 39–117)
Bilirubin, Direct: 0.1 mg/dL (ref 0.0–0.3)
Total Bilirubin: 0.4 mg/dL (ref 0.3–1.2)

## 2012-02-24 LAB — LIPID PANEL: LDL Cholesterol: 143 mg/dL — ABNORMAL HIGH (ref 0–99)

## 2012-02-24 LAB — BASIC METABOLIC PANEL WITH GFR
BUN: 14 mg/dL (ref 6–23)
Creat: 0.63 mg/dL (ref 0.50–1.10)
GFR, Est African American: >89 mL/min
GFR, Est Non African American: >89 mL/min
Glucose, Bld: 83 mg/dL (ref 70–99)

## 2012-02-24 NOTE — Assessment & Plan Note (Signed)
Pt counseled on diet, exercise and weight loss.  Refer for mammo, dexa, obtain fasting labs.

## 2012-02-24 NOTE — Patient Instructions (Addendum)
Please schedule Mammogram on the first floor after 04/12/12. Complete chest x-ray on the first floor.  Schedule bone density at the front desk. Complete lab work prior to leaving.  You will be contact about your appointment for MRI.  Please let us know if you have not heard back within 1 week about this appointment.  Please follow up in 6 months- sooner if problems/concerns.
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
## 2012-02-24 NOTE — Assessment & Plan Note (Signed)
Will obtain MRI of the L Spine.

## 2012-02-24 NOTE — Assessment & Plan Note (Signed)
Defer management to endocrinology

## 2012-02-24 NOTE — Assessment & Plan Note (Signed)
Pt is concerned re: possibility of lung cancer as her dad died of lung cancer. Will obtain CXR.  I suspect allergies are cause of her cough though.

## 2012-02-25 LAB — URINALYSIS, ROUTINE W REFLEX MICROSCOPIC
Glucose, UA: NEGATIVE mg/dL
Ketones, ur: NEGATIVE mg/dL
Leukocytes, UA: NEGATIVE
Specific Gravity, Urine: 1.019 (ref 1.005–1.030)
pH: 5 (ref 5.0–8.0)

## 2012-02-25 LAB — URINALYSIS, MICROSCOPIC ONLY
Casts: NONE SEEN
Crystals: NONE SEEN

## 2012-02-26 ENCOUNTER — Ambulatory Visit (INDEPENDENT_AMBULATORY_CARE_PROVIDER_SITE_OTHER)
Admission: RE | Admit: 2012-02-26 | Discharge: 2012-02-26 | Disposition: A | Discharge status: Home / Self Care | Payer: Managed Care, Other (non HMO) | Source: Ambulatory Visit

## 2012-02-26 DIAGNOSIS — Z0000 Encounter for general adult medical examination without abnormal findings: Secondary | ICD-10-CM

## 2012-02-27 ENCOUNTER — Encounter: Payer: Self-pay | Admitting: Family

## 2012-02-27 DIAGNOSIS — E785 Hyperlipidemia, unspecified: Secondary | ICD-10-CM | POA: Insufficient documentation

## 2012-03-01 ENCOUNTER — Telehealth: Payer: Self-pay | Admitting: Family

## 2012-03-01 NOTE — Telephone Encounter (Signed)
Pls call pt and let her know that [employer] has not approved her MRI of her spine.  They require that we follow her for 6 weeks and re-evaluate her.  If not improvement at that time, if leg weakness, severe or worsening pain occurs then we can resubmit our request. She should follow up in 6 weeks please, sooner if symptoms worsen. If she is able to tolerate aleve (I see aspirin causes gi upset), then she can take one tablet bid for 1 week to see if this helps with the numbness of the right leg.

## 2012-03-02 NOTE — Telephone Encounter (Signed)
Left message on home and cell#s to return my call. 

## 2012-03-03 NOTE — Telephone Encounter (Signed)
Notified pt. She states symptoms are about the same, no worse at this time but will call us if symptoms worsen.  Follow up has been scheduled for 04/14/12 at 10:30am. Pt reports that she is unable to take Aleve.

## 2012-03-11 ENCOUNTER — Telehealth: Payer: Self-pay | Admitting: Family

## 2012-03-11 DIAGNOSIS — R200 Anesthesia of skin: Secondary | ICD-10-CM

## 2012-03-11 NOTE — Telephone Encounter (Signed)
Called to [facility] for update on Authorization for MRI -  Cervical Neck , has been denied.    They will fax reason for this denial .

## 2012-03-12 NOTE — Telephone Encounter (Signed)
Pls call pt and let her know that her insurance did not approve MRI of her lumbar spine.  Instead, we can start with some x-rays of her back. (ordered)

## 2012-03-12 NOTE — Telephone Encounter (Signed)
Notified pt. She is agreeable to proceed with xrays. Radiology # given to pt to call before arriving. Pt wanted to let you know that she feels her pain has gotten a little worse, is experiencing more numbness in her thigh and leg feels a little weaker.

## 2012-03-12 NOTE — Telephone Encounter (Signed)
Left message for pt to return my call.

## 2012-03-13 ENCOUNTER — Ambulatory Visit (HOSPITAL_BASED_OUTPATIENT_CLINIC_OR_DEPARTMENT_OTHER)
Admission: RE | Admit: 2012-03-13 | Discharge: 2012-03-13 | Disposition: A | Discharge status: Home / Self Care | Payer: Managed Care, Other (non HMO) | Source: Ambulatory Visit | Attending: Family | Admitting: Family

## 2012-03-13 DIAGNOSIS — M51379 Other intervertebral disc degeneration, lumbosacral region without mention of lumbar back pain or lower extremity pain: Secondary | ICD-10-CM | POA: Insufficient documentation

## 2012-03-13 DIAGNOSIS — M5137 Other intervertebral disc degeneration, lumbosacral region: Secondary | ICD-10-CM | POA: Insufficient documentation

## 2012-03-13 DIAGNOSIS — M47817 Spondylosis without myelopathy or radiculopathy, lumbosacral region: Secondary | ICD-10-CM | POA: Insufficient documentation

## 2012-03-13 DIAGNOSIS — R200 Anesthesia of skin: Secondary | ICD-10-CM

## 2012-03-15 ENCOUNTER — Telehealth: Payer: Self-pay | Admitting: Family

## 2012-03-15 NOTE — Telephone Encounter (Signed)
Reviewed X-ray- notes DDD.  Pt reports ongoing tingling in the right leg, now a bit lower.  Also notes some weakness in the right leg.  I instructed pt to schedule a follow up visit to be seen.  Pt verbalizes understanding.

## 2012-03-17 ENCOUNTER — Ambulatory Visit (INDEPENDENT_AMBULATORY_CARE_PROVIDER_SITE_OTHER): Payer: Managed Care, Other (non HMO) | Admitting: Family

## 2012-03-17 ENCOUNTER — Encounter: Payer: Self-pay | Admitting: Family

## 2012-03-17 VITALS — BP 100/70 | HR 84 | Temp 98.4°F | Resp 16 | Wt 153.0 lb

## 2012-03-17 DIAGNOSIS — M545 Low back pain, unspecified: Secondary | ICD-10-CM

## 2012-03-17 DIAGNOSIS — M79604 Pain in right leg: Secondary | ICD-10-CM | POA: Insufficient documentation

## 2012-03-17 MED ORDER — CYCLOBENZAPRINE HCL 5 MG PO TABS: 5.0000 mg | ORAL_TABLET | Freq: Three times a day (TID) | ORAL | Status: AC | PRN

## 2012-03-17 MED ORDER — METHYLPREDNISOLONE (PAK) 4 MG PO TABS: ORAL_TABLET | ORAL | Status: AC

## 2012-03-17 NOTE — Patient Instructions (Addendum)
We will let you know about your MRI. Please follow up in 1 month.

## 2012-03-17 NOTE — Assessment & Plan Note (Signed)
Worsening, no improvement with NSAIDS.  Now with increased RLE numbness and bilateral LE numbness.  Recent plain film of the lumbar spine notes multilevel degenerative disc disease and lumbar spondylosis.  Will request MRI due to worsening symptoms.  Rx with medrol dose pak and flexeril.
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
## 2012-03-23 ENCOUNTER — Other Ambulatory Visit: Payer: Self-pay | Admitting: Family

## 2012-03-23 ENCOUNTER — Telehealth: Payer: Self-pay | Admitting: Family

## 2012-03-23 DIAGNOSIS — Z1231 Encounter for screening mammogram for malignant neoplasm of breast: Secondary | ICD-10-CM

## 2012-03-23 NOTE — Telephone Encounter (Signed)
Received denial from [employer] re: MRI L spine. Called spoke with Dr. Robin Searing for peer to peer.  MRI L spine has been approved and the approval number is [id number].

## 2012-03-27 ENCOUNTER — Other Ambulatory Visit: Payer: Managed Care, Other (non HMO)

## 2012-03-27 ENCOUNTER — Ambulatory Visit
Admission: RE | Admit: 2012-03-27 | Discharge: 2012-03-27 | Disposition: A | Discharge status: Home / Self Care | Payer: Managed Care, Other (non HMO) | Source: Ambulatory Visit | Attending: Family | Admitting: Family

## 2012-03-27 DIAGNOSIS — M79604 Pain in right leg: Secondary | ICD-10-CM

## 2012-03-27 DIAGNOSIS — M545 Low back pain: Secondary | ICD-10-CM

## 2012-03-28 ENCOUNTER — Other Ambulatory Visit: Payer: Managed Care, Other (non HMO)

## 2012-03-29 ENCOUNTER — Telehealth: Payer: Self-pay | Admitting: Family

## 2012-03-29 NOTE — Telephone Encounter (Signed)
Spoke with pt, reviewed MRI results.  Recommended referral to neurosurgery. Recommended Dr. Danielle Dess, she would like someone closer to her home. She will do some research and call us back with someone closer to her home.

## 2012-03-31 ENCOUNTER — Telehealth: Payer: Self-pay | Admitting: Family

## 2012-03-31 DIAGNOSIS — M549 Dorsalgia, unspecified: Secondary | ICD-10-CM

## 2012-03-31 NOTE — Telephone Encounter (Signed)
Patient states that she has talked to [facility] about referring her to a neurologist. Patient would like to proceed with referral. Per patient, any neuro in [place] will be fine.

## 2012-04-06 ENCOUNTER — Other Ambulatory Visit: Payer: Self-pay | Admitting: Family

## 2012-04-06 NOTE — Telephone Encounter (Signed)
Alprazolam request [Last Rx 07.01.13 #30x0]/SLS Please advise.

## 2012-04-14 ENCOUNTER — Ambulatory Visit: Payer: Managed Care, Other (non HMO) | Admitting: Family

## 2012-04-19 ENCOUNTER — Other Ambulatory Visit: Payer: Self-pay | Admitting: Neurosurgery

## 2012-04-19 DIAGNOSIS — M549 Dorsalgia, unspecified: Secondary | ICD-10-CM

## 2012-04-19 DIAGNOSIS — M5410 Radiculopathy, site unspecified: Secondary | ICD-10-CM

## 2012-04-20 ENCOUNTER — Other Ambulatory Visit: Payer: Self-pay | Admitting: Family

## 2012-04-21 ENCOUNTER — Telehealth: Payer: Self-pay | Admitting: Family

## 2012-04-21 NOTE — Telephone Encounter (Signed)
Refill- cyclobenzaprine 5mg  tablets. Take one tablet by mouth three times daily as needed for muscle spasm. Qty 20 last fill 9.11.13

## 2012-04-21 NOTE — Telephone Encounter (Signed)
See previous refill note

## 2012-04-21 NOTE — Telephone Encounter (Signed)
Has myelogram scheduled for this Wednesday. Advised pt to contact Dr Jeral Fruit (neurosurgery) for continued refills/treatment of her pain and she voices understanding.

## 2012-04-23 ENCOUNTER — Ambulatory Visit
Admission: RE | Admit: 2012-04-23 | Discharge: 2012-04-23 | Disposition: A | Discharge status: Home / Self Care | Payer: Managed Care, Other (non HMO) | Source: Ambulatory Visit | Attending: Neurosurgery | Admitting: Neurosurgery

## 2012-04-23 VITALS — BP 112/67 | HR 91 | Ht 63.0 in | Wt 151.0 lb

## 2012-04-23 DIAGNOSIS — M549 Dorsalgia, unspecified: Secondary | ICD-10-CM

## 2012-04-23 DIAGNOSIS — R200 Anesthesia of skin: Secondary | ICD-10-CM

## 2012-04-23 DIAGNOSIS — M545 Low back pain: Secondary | ICD-10-CM

## 2012-04-23 DIAGNOSIS — M5410 Radiculopathy, site unspecified: Secondary | ICD-10-CM

## 2012-04-23 DIAGNOSIS — M79604 Pain in right leg: Secondary | ICD-10-CM

## 2012-04-23 MED ORDER — DIPHENHYDRAMINE HCL 50 MG PO CAPS
50.0000 mg | ORAL_CAPSULE | Freq: Once | ORAL | Status: AC
  Administered 2012-04-23: 50 mg via ORAL

## 2012-04-23 MED ORDER — ONDANSETRON HCL 4 MG/2ML IJ SOLN
4.0000 mg | Freq: Once | INTRAMUSCULAR | Status: AC
  Administered 2012-04-23: 4 mg via INTRAMUSCULAR

## 2012-04-23 MED ORDER — HYDROMORPHONE HCL PF 2 MG/ML IJ SOLN
2.0000 mg | Freq: Once | INTRAMUSCULAR | Status: AC
  Administered 2012-04-23: 2 mg via INTRAMUSCULAR

## 2012-04-23 MED ORDER — IOHEXOL 180 MG/ML  SOLN
15.0000 mL | Freq: Once | INTRAMUSCULAR | Status: AC | PRN
  Administered 2012-04-23: 15 mL via INTRATHECAL

## 2012-04-23 MED ORDER — DIAZEPAM 5 MG PO TABS
10.0000 mg | ORAL_TABLET | Freq: Once | ORAL | Status: AC
  Administered 2012-04-23: 10 mg via ORAL

## 2012-04-23 NOTE — Progress Notes (Signed)
Patient c/o feeling "hot and like I'm going to be sick."  Turned onto left side, ice pack placed on back of neck, emesis bag given to patient.  Husband at bedside.  jkl

## 2012-04-23 NOTE — Progress Notes (Signed)
Resting quietly lying on back.  Pain "better."  "I hate the way pain medicine makes me feel."  Tolerating sips of Coke.  jkl

## 2012-04-23 NOTE — Progress Notes (Signed)
Patient continues with nausea, vomiting approximately 30cc liquid emesis.  Also scratching nose as if a histamine response from Dilaudid.  Benadryl 50mg  PO given to attempt to relieve itching, upset stomach.  jkl

## 2012-04-26 ENCOUNTER — Ambulatory Visit (HOSPITAL_BASED_OUTPATIENT_CLINIC_OR_DEPARTMENT_OTHER): Payer: Managed Care, Other (non HMO)

## 2012-04-26 ENCOUNTER — Telehealth: Payer: Self-pay | Admitting: Radiology

## 2012-04-26 ENCOUNTER — Inpatient Hospital Stay (HOSPITAL_BASED_OUTPATIENT_CLINIC_OR_DEPARTMENT_OTHER): Admission: RE | Admit: 2012-04-26 | Payer: Managed Care, Other (non HMO) | Source: Ambulatory Visit

## 2012-04-26 NOTE — Telephone Encounter (Signed)
Patient called to report positional headache since 24 hours of strict bedrest ended after myelogram here 04/23/12.  Will obtain order from Becky/Dr. Jeral Fruit for blood patch, but patient wants to try more bedrest today.  Will call us in a.m. after work if headache persists to get blood patch.  Donell Sievert, RN

## 2012-04-27 ENCOUNTER — Encounter: Payer: Self-pay | Admitting: Family

## 2012-04-27 DIAGNOSIS — M8580 Other specified disorders of bone density and structure, unspecified site: Secondary | ICD-10-CM | POA: Insufficient documentation

## 2012-04-27 MED ORDER — CALCIUM CARBONATE-VITAMIN D 600-400 MG-UNIT PO TABS: 1.0000 | ORAL_TABLET | Freq: Two times a day (BID) | ORAL | Status: DC

## 2012-04-28 ENCOUNTER — Ambulatory Visit (HOSPITAL_BASED_OUTPATIENT_CLINIC_OR_DEPARTMENT_OTHER): Payer: Managed Care, Other (non HMO)

## 2012-05-07 ENCOUNTER — Ambulatory Visit (HOSPITAL_BASED_OUTPATIENT_CLINIC_OR_DEPARTMENT_OTHER)
Admission: RE | Admit: 2012-05-07 | Discharge: 2012-05-07 | Disposition: A | Discharge status: Home / Self Care | Payer: Managed Care, Other (non HMO) | Source: Ambulatory Visit | Attending: Family | Admitting: Family

## 2012-05-07 DIAGNOSIS — Z1231 Encounter for screening mammogram for malignant neoplasm of breast: Secondary | ICD-10-CM

## 2012-05-19 ENCOUNTER — Other Ambulatory Visit: Payer: Self-pay | Admitting: Family

## 2012-05-19 DIAGNOSIS — R928 Other abnormal and inconclusive findings on diagnostic imaging of breast: Secondary | ICD-10-CM

## 2012-05-25 ENCOUNTER — Other Ambulatory Visit: Payer: Self-pay | Admitting: Family

## 2012-05-26 NOTE — Telephone Encounter (Signed)
OK to send #30 with zero refills.  

## 2012-05-26 NOTE — Telephone Encounter (Signed)
Alprazolam request [Last Rx 10.01.13 #30x0]/SLS Please advise.

## 2012-05-27 NOTE — Telephone Encounter (Signed)
Refill left on pharmacy voicemail. 

## 2012-06-01 ENCOUNTER — Ambulatory Visit
Admission: RE | Admit: 2012-06-01 | Discharge: 2012-06-01 | Disposition: A | Discharge status: Home / Self Care | Payer: Managed Care, Other (non HMO) | Source: Ambulatory Visit | Attending: Family | Admitting: Family

## 2012-06-01 ENCOUNTER — Other Ambulatory Visit: Payer: Self-pay | Admitting: Family

## 2012-06-01 DIAGNOSIS — R928 Other abnormal and inconclusive findings on diagnostic imaging of breast: Secondary | ICD-10-CM

## 2012-06-02 ENCOUNTER — Other Ambulatory Visit: Payer: Self-pay | Admitting: Family

## 2012-06-02 ENCOUNTER — Encounter: Payer: Self-pay | Admitting: Family

## 2012-06-02 DIAGNOSIS — D0510 Intraductal carcinoma in situ of unspecified breast: Secondary | ICD-10-CM | POA: Insufficient documentation

## 2012-06-02 DIAGNOSIS — C50912 Malignant neoplasm of unspecified site of left female breast: Secondary | ICD-10-CM

## 2012-06-04 ENCOUNTER — Telehealth: Payer: Self-pay | Admitting: *Deleted

## 2012-06-04 DIAGNOSIS — C50212 Malignant neoplasm of upper-inner quadrant of left female breast: Secondary | ICD-10-CM | POA: Insufficient documentation

## 2012-06-04 DIAGNOSIS — C50219 Malignant neoplasm of upper-inner quadrant of unspecified female breast: Secondary | ICD-10-CM

## 2012-06-04 NOTE — Telephone Encounter (Signed)
Confirmed [facility] for 06/09/12 at 1230 .  Instructions and contact information given.

## 2012-06-07 ENCOUNTER — Other Ambulatory Visit: Payer: Managed Care, Other (non HMO)

## 2012-06-08 ENCOUNTER — Ambulatory Visit
Admission: RE | Admit: 2012-06-08 | Discharge: 2012-06-08 | Disposition: A | Discharge status: Home / Self Care | Payer: Managed Care, Other (non HMO) | Source: Ambulatory Visit | Attending: Family | Admitting: Family

## 2012-06-08 DIAGNOSIS — C50912 Malignant neoplasm of unspecified site of left female breast: Secondary | ICD-10-CM

## 2012-06-08 MED ORDER — GADOBENATE DIMEGLUMINE 529 MG/ML IV SOLN
14.0000 mL | Freq: Once | INTRAVENOUS | Status: AC | PRN
  Administered 2012-06-08: 14 mL via INTRAVENOUS

## 2012-06-09 ENCOUNTER — Ambulatory Visit
Admission: RE | Admit: 2012-06-09 | Discharge: 2012-06-09 | Disposition: A | Discharge status: Home / Self Care | Payer: Managed Care, Other (non HMO) | Source: Ambulatory Visit | Attending: Radiation Oncology | Admitting: Radiation Oncology

## 2012-06-09 ENCOUNTER — Ambulatory Visit (HOSPITAL_BASED_OUTPATIENT_CLINIC_OR_DEPARTMENT_OTHER): Payer: Managed Care, Other (non HMO) | Admitting: General Surgery

## 2012-06-09 ENCOUNTER — Encounter: Payer: Self-pay | Admitting: *Deleted

## 2012-06-09 ENCOUNTER — Ambulatory Visit: Payer: Managed Care, Other (non HMO) | Attending: General Surgery | Admitting: Physical Therapy

## 2012-06-09 ENCOUNTER — Ambulatory Visit (HOSPITAL_BASED_OUTPATIENT_CLINIC_OR_DEPARTMENT_OTHER): Payer: Managed Care, Other (non HMO)

## 2012-06-09 ENCOUNTER — Encounter (INDEPENDENT_AMBULATORY_CARE_PROVIDER_SITE_OTHER): Payer: Self-pay | Admitting: General Surgery

## 2012-06-09 ENCOUNTER — Other Ambulatory Visit: Payer: Self-pay | Admitting: Family

## 2012-06-09 ENCOUNTER — Ambulatory Visit (HOSPITAL_BASED_OUTPATIENT_CLINIC_OR_DEPARTMENT_OTHER): Payer: Managed Care, Other (non HMO) | Admitting: Oncology

## 2012-06-09 ENCOUNTER — Other Ambulatory Visit (HOSPITAL_BASED_OUTPATIENT_CLINIC_OR_DEPARTMENT_OTHER): Payer: Managed Care, Other (non HMO) | Admitting: Lab

## 2012-06-09 VITALS — BP 121/81 | HR 86 | Temp 97.8°F | Resp 20 | Ht 63.0 in | Wt 151.2 lb

## 2012-06-09 DIAGNOSIS — R293 Abnormal posture: Secondary | ICD-10-CM | POA: Insufficient documentation

## 2012-06-09 DIAGNOSIS — C50219 Malignant neoplasm of upper-inner quadrant of unspecified female breast: Secondary | ICD-10-CM

## 2012-06-09 DIAGNOSIS — IMO0001 Reserved for inherently not codable concepts without codable children: Secondary | ICD-10-CM | POA: Insufficient documentation

## 2012-06-09 DIAGNOSIS — M545 Low back pain, unspecified: Secondary | ICD-10-CM | POA: Insufficient documentation

## 2012-06-09 DIAGNOSIS — M25619 Stiffness of unspecified shoulder, not elsewhere classified: Secondary | ICD-10-CM | POA: Insufficient documentation

## 2012-06-09 DIAGNOSIS — D0590 Unspecified type of carcinoma in situ of unspecified breast: Secondary | ICD-10-CM

## 2012-06-09 DIAGNOSIS — R928 Other abnormal and inconclusive findings on diagnostic imaging of breast: Secondary | ICD-10-CM

## 2012-06-09 DIAGNOSIS — C50919 Malignant neoplasm of unspecified site of unspecified female breast: Secondary | ICD-10-CM | POA: Insufficient documentation

## 2012-06-09 LAB — CBC WITH DIFFERENTIAL/PLATELET
BASO%: 0.3 % (ref 0.0–2.0)
EOS%: 0.8 % (ref 0.0–7.0)
HCT: 37.7 % (ref 34.8–46.6)
LYMPH%: 32.5 % (ref 14.0–49.7)
MCH: 29.4 pg (ref 25.1–34.0)
MCHC: 33.3 g/dL (ref 31.5–36.0)
MONO%: 7.7 % (ref 0.0–14.0)
NEUT%: 58.7 % (ref 38.4–76.8)
Platelets: 227 10*3/uL (ref 145–400)
RBC: 4.26 10*6/uL (ref 3.70–5.45)
WBC: 8 10*3/uL (ref 3.9–10.3)

## 2012-06-09 LAB — COMPREHENSIVE METABOLIC PANEL (CC13)
ALT: 14 U/L (ref 0–55)
AST: 17 U/L (ref 5–34)
Alkaline Phosphatase: 90 U/L (ref 40–150)
Creatinine: 0.8 mg/dL (ref 0.6–1.1)
Sodium: 142 mEq/L (ref 136–145)
Total Bilirubin: 0.33 mg/dL (ref 0.20–1.20)
Total Protein: 7.6 g/dL (ref 6.4–8.3)

## 2012-06-09 NOTE — Patient Instructions (Signed)
Recent biopsy of your left breast shows high-grade ductal carcinoma in situ.  We have talked about lumpectomy, mastectomy, and sentinel node biopsy.  You will be scheduled for a second biopsy of the left breast to determine the full extent of your cancer.  You will be scheduled to see Dr. Derrell Lolling in his office a few days after the second biopsy to finalize your surgical treatment plan. Hopefully we will be able to perform a left partial mastectomy with a bracketed needle localization and a sentinel node biopsy.
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
## 2012-06-10 ENCOUNTER — Telehealth: Payer: Self-pay | Admitting: Family

## 2012-06-10 ENCOUNTER — Telehealth: Payer: Self-pay | Admitting: *Deleted

## 2012-06-10 ENCOUNTER — Encounter: Payer: Self-pay | Admitting: Specialist

## 2012-06-10 NOTE — Telephone Encounter (Addendum)
Gave patient appointment confirmed over the phone the new date and time in 08-2012

## 2012-06-10 NOTE — Telephone Encounter (Signed)
Called patient on cell provided LMOM--advised change ok per dr.tabori but moving forward if she chose to switch to any other provider she would not be able to swich back to dr.tabor

## 2012-06-10 NOTE — Telephone Encounter (Signed)
Pt would like to change PCP from Melissa to Tabori---please advise if ok with all & I will call patient back to schedule appt, & cancel follow up appts [phone]

## 2012-06-10 NOTE — Progress Notes (Signed)
I met the patient at the multidisciplinary clinic and reviewed with her the distress screening.  I also gave her information on available support services, including the Breast Cancer Support Group.

## 2012-06-10 NOTE — Telephone Encounter (Signed)
Pt was previously my pt but switched fall of last year to [place].  I'm not sure what prompted the switch last year or why she would like to return.  This is ok by me but Dois Davenport or Harriett Sine may need to discuss policy of frequently switching doctors

## 2012-06-10 NOTE — Telephone Encounter (Signed)
Ok

## 2012-06-14 ENCOUNTER — Telehealth: Payer: Self-pay | Admitting: *Deleted

## 2012-06-14 NOTE — Telephone Encounter (Signed)
Spoke to pt concerning [facility] from 06/09/12.  Pt denies questions or concerns regarding dx or treatment care plan.  Confirmed 2nd bx date.  Encourage pt to call with needs.  Received verbal understanding.  Contact information given.

## 2012-06-15 ENCOUNTER — Ambulatory Visit
Admission: RE | Admit: 2012-06-15 | Discharge: 2012-06-15 | Disposition: A | Discharge status: Home / Self Care | Payer: Managed Care, Other (non HMO) | Source: Ambulatory Visit | Attending: Family | Admitting: Family

## 2012-06-15 DIAGNOSIS — R928 Other abnormal and inconclusive findings on diagnostic imaging of breast: Secondary | ICD-10-CM

## 2012-06-15 MED ORDER — GADOBENATE DIMEGLUMINE 529 MG/ML IV SOLN
14.0000 mL | Freq: Once | INTRAVENOUS | Status: AC | PRN
  Administered 2012-06-15: 14 mL via INTRAVENOUS

## 2012-06-16 ENCOUNTER — Ambulatory Visit (INDEPENDENT_AMBULATORY_CARE_PROVIDER_SITE_OTHER): Payer: Managed Care, Other (non HMO) | Admitting: Family Medicine

## 2012-06-16 ENCOUNTER — Encounter: Payer: Self-pay | Admitting: Family Medicine

## 2012-06-16 ENCOUNTER — Telehealth (INDEPENDENT_AMBULATORY_CARE_PROVIDER_SITE_OTHER): Payer: Self-pay | Admitting: General Surgery

## 2012-06-16 VITALS — BP 110/70 | HR 80 | Temp 98.1°F | Ht 63.5 in | Wt 153.8 lb

## 2012-06-16 DIAGNOSIS — F329 Major depressive disorder, single episode, unspecified: Secondary | ICD-10-CM

## 2012-06-16 DIAGNOSIS — F3289 Other specified depressive episodes: Secondary | ICD-10-CM

## 2012-06-16 MED ORDER — ALPRAZOLAM 0.25 MG PO TABS: 0.2500 mg | ORAL_TABLET | Freq: Three times a day (TID) | ORAL | Status: DC | PRN

## 2012-06-16 MED ORDER — BUPROPION HCL ER (XL) 150 MG PO TB24: 150.0000 mg | ORAL_TABLET | Freq: Every day | ORAL | Status: DC

## 2012-06-16 NOTE — Progress Notes (Signed)
  Subjective:    Patient ID: [name] [name], female    DOB: [birth year], 51 y.o.   MRN: [id number]  HPI Anxiety/depression- recently dx'd w/ breast cancer, has upcoming lumbar micro surgery, lost sister to 'massive heart attack'.  Has been taking xanax nightly for anxiety/sleep.  Having nightmares about diagnosis.  Daughter has lost 'all faith in god' over mom's recent dx which is very upsetting to pt.  Not currently on depression med- was previously on Zoloft.  Having trouble concentrating recently.  Difficulty sleeping.   Review of Systems For ROS see HPI     Objective:   Physical Exam  Vitals reviewed. Constitutional: She is oriented to person, place, and time. She appears well-developed and well-nourished. No distress.  HENT:  Head: Normocephalic and atraumatic.  Cardiovascular: Normal rate, regular rhythm, normal heart sounds and intact distal pulses.   Pulmonary/Chest: Effort normal and breath sounds normal. No respiratory distress. She has no wheezes. She has no rales.  Musculoskeletal: She exhibits no edema.  Neurological: She is alert and oriented to person, place, and time. No cranial nerve deficit. Coordination normal.  Psychiatric:       Anxious, shaking hands          Assessment & Plan:

## 2012-06-16 NOTE — Patient Instructions (Addendum)
Follow up in 1 month to recheck mood Start the Wellbutrin daily Use the alprazolam as needed for anxiety Call with any questions or concerns Hang in there! Call if you need me!!! Happy Holidays!!!

## 2012-06-16 NOTE — Telephone Encounter (Signed)
Patient called in and was given her MRI results by the breast center today. Patient wanted to discuss results and I advised that when Nedra Hai from the breast center called today, I forwarded the message to Dr. Derrell Lolling to advise. I advised the patient that I would send another message to Dr. Derrell Lolling to advise she has been added to his schedule for 06/24/12 at 11:00. I advised that if I can get move the appointment up earlier in the week then I will. I advised the patient he is at the hospital this week and we do not have clinic due to the schedule and surgeries. Patient agreed to leave message on vmail if she cannot pick up when I call back.

## 2012-06-16 NOTE — Assessment & Plan Note (Signed)
Deteriorated due to recent dx of breast cancer.  Start daily controller med- Wellbutrin.  Continue Alprazolam prn.  Discussed possible counseling.  Will follow closely.  Total time spent w/ pt- 25 min, >50% spent counseling

## 2012-06-17 ENCOUNTER — Telehealth: Payer: Self-pay | Admitting: *Deleted

## 2012-06-17 NOTE — Telephone Encounter (Signed)
Confirmed f/u appt with Dr. Derrell Lolling on 06/24/12./  Pt denies further needs at this time.  Contact information given.

## 2012-06-18 ENCOUNTER — Telehealth (INDEPENDENT_AMBULATORY_CARE_PROVIDER_SITE_OTHER): Payer: Self-pay | Admitting: General Surgery

## 2012-06-18 ENCOUNTER — Telehealth: Payer: Self-pay | Admitting: *Deleted

## 2012-06-18 NOTE — Telephone Encounter (Signed)
Called and left message for patient on home and cell phone to advise that the appointment set up to see Dr. Derrell Lolling on 06/24/12 remains as of right now due to lack of cancellation. Advised that if an appointment opens up and there is enough time to notify her I would call to advise.

## 2012-06-18 NOTE — Telephone Encounter (Signed)
Error

## 2012-06-24 ENCOUNTER — Ambulatory Visit (INDEPENDENT_AMBULATORY_CARE_PROVIDER_SITE_OTHER): Payer: Managed Care, Other (non HMO) | Admitting: General Surgery

## 2012-06-24 ENCOUNTER — Encounter (INDEPENDENT_AMBULATORY_CARE_PROVIDER_SITE_OTHER): Payer: Self-pay | Admitting: General Surgery

## 2012-06-24 VITALS — BP 110/70 | HR 84 | Temp 96.8°F | Resp 20 | Ht 63.0 in | Wt 152.8 lb

## 2012-06-24 DIAGNOSIS — C50219 Malignant neoplasm of upper-inner quadrant of unspecified female breast: Secondary | ICD-10-CM

## 2012-06-24 NOTE — Patient Instructions (Signed)
We have discussed your second breast biopsy, which shows high-grade ductal carcinoma in situ. This demonstrates that there is cancer in the left breast that is arranged in an anterior to posterior area and is 8 cm in distance. We have discussed the options of lumpectomy and sentinel biopsy, mastectomy, with or without reconstruction.  You have decided that you would like to have a left mastectomy, sentinel node biopsy, and immediate reconstruction.  You'll be referred to a plastic surgeon to discuss the reconstructive issues. After that consultation we will coordinate your surgery to be done in January.    Mastectomy, With or Without Reconstruction Mastectomy (removal of the breast) is a procedure most commonly used to treat cancer (tumor) of the breast. Different procedures are available for treatment. This depends on the stage of the tumor (abnormal growths). Discuss this with your caregiver, surgeon (a specialist for performing operations such as this), or oncologist (someone specialized in the treatment of cancer). With proper information, you can decide which treatment is best for you. Although the sound of the word cancer is frightening to all of [place], the new treatments and medications can be a source of reassurance and comfort. If there are things you are worried about, discuss them with your caregiver. He or she can help comfort you and your family. Some of the different procedures for treating breast cancer are:  Radical (extensive) mastectomy. This is an operation used to remove the entire breast, the muscles under the breast, and all of the glands (lymph nodes) under the arm. With all of the new treatments available for cancer of the breast, this procedure has become less common.  Modified radical mastectomy. This is a similar operation to the radical mastectomy described above. In the modified radical mastectomy, the muscles of the chest wall are not removed unless one of the lessor muscles  is removed. One of the lessor muscles may be removed to allow better removal of the lymph nodes. The axillary lymph nodes are also removed. Rarely, during an axillary node dissection nerves to this area are damaged. Radiation therapy is then often used to the area following this surgery.  A total mastectomy also known as a complete or simple mastectomy. It involves removal of only the breast. The lymph nodes and the muscles are left in place.  In a lumpectomy, the lump is removed from the breast. This is the simplest form of surgical treatment. A sentinel lymph node biopsy may also be done. Additional treatment may be required. RISKS AND COMPLICATIONS The main problems that follow removal of the breast include:  Infection (germs start growing in the wound). This can usually be treated with antibiotics (medications that kill germs).  Lymphedema. This means the arm on the side of the breast that was operated on swells because the lymph (tissue fluid) cannot follow the main channels back into the body. This only occurs when the lymph nodes have had to be removed under the arm.  There may be some areas of numbness to the upper arm and around the incision (cut by the surgeon) in the breast. This happens because of the cutting of or damage to some of the nerves in the area. This is most often unavoidable.  There may be difficultymoving the arm in a full range of motion (moving in all directions) following surgery. This usually improves with time following use and exercise.  Recurrence of breast cancer may happen with the very best of surgery and follow up treatment. Sometimes small cancer  cells that cannot be seen with the naked eye have already spread at the time of surgery. When this happens other treatment is available. This treatment may be radiation, medications or a combination of both. RECONSTRUCTION Reconstruction of the breast may be done immediately if there is not going to be post-operative  radiation. This surgery is done for cosmetic (improve appearance) purposes to improve the physical appearance after the operation. This may be done in two ways:  It can be done using a saline filled prosthetic (an artificial breast which is filled with salt water). Silicone breast implants are now re-approved by the FDA and are being commonly used.  Reconstruction can be done using the body's own muscle/fat/skin. Your caregiver will discuss your options with you. Depending upon your needs or choice, together you will be able to determine which procedure is best for you. Document Released: 03/18/2001 Document Revised: 09/15/2011 Document Reviewed: 11/09/2007 [facility] Patient Information 2013 [place], [place].
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
## 2012-06-28 ENCOUNTER — Encounter: Payer: Self-pay | Admitting: *Deleted

## 2012-06-28 NOTE — Progress Notes (Signed)
Mailed after appt letter to pt. 

## 2012-07-09 ENCOUNTER — Other Ambulatory Visit (INDEPENDENT_AMBULATORY_CARE_PROVIDER_SITE_OTHER): Payer: Self-pay | Admitting: General Surgery

## 2012-07-14 ENCOUNTER — Other Ambulatory Visit: Payer: Self-pay | Admitting: Oncology

## 2012-07-19 ENCOUNTER — Encounter: Payer: Self-pay | Admitting: Family Medicine

## 2012-07-19 ENCOUNTER — Ambulatory Visit (INDEPENDENT_AMBULATORY_CARE_PROVIDER_SITE_OTHER): Payer: PRIVATE HEALTH INSURANCE | Admitting: Family Medicine

## 2012-07-19 ENCOUNTER — Telehealth: Payer: Self-pay | Admitting: *Deleted

## 2012-07-19 VITALS — BP 106/70 | HR 74 | Temp 97.7°F | Ht 62.25 in | Wt 151.4 lb

## 2012-07-19 DIAGNOSIS — F3289 Other specified depressive episodes: Secondary | ICD-10-CM

## 2012-07-19 DIAGNOSIS — F329 Major depressive disorder, single episode, unspecified: Secondary | ICD-10-CM

## 2012-07-19 NOTE — Patient Instructions (Addendum)
Please call me if you think you need to increase your medication Watch for crying, overwhelming sadness, withdrawing from people Make sure you open the lines of communication w/ your daughter- you need each other! Call Dr Phineas Semen and ask about restarting the gabapentin for the burning pain Call with any questions or concerns Hang in there! Daisey Must!

## 2012-07-19 NOTE — Assessment & Plan Note (Signed)
Unchanged.  Pt feels some sxs have improved since starting Wellbutrin but has double mastectomy scheduled for 2/14.  Will continue to follow sxs closely.  Encouraged counseling, support groups, opening up communication w/ daughter to avoid 'pretending'.  Pt expressed understanding and is in agreement w/ plan.

## 2012-07-19 NOTE — Progress Notes (Signed)
  Subjective:    Patient ID: [name] [name], female    DOB: 1961-03-20, 52 y.o.   MRN: [id number]  HPI Depression- ongoing issue for pt.  Started on Wellbutrin at last visit.  Feels mood and anxiety have improved.  Has upcoming bilateral mastectomy on 2/14.  'i don't think there's any amount of medicine in the world that will make that ok'.  Pt admits to poor appetite.  Thinking about surgery 'all the time'.  Some tearfulness.  Having trouble b/c daughter wants her to be strong and not show emotion.  Denies SI/HI.   Review of Systems For ROS see HPI     Objective:   Physical Exam  Vitals reviewed. Constitutional: She is oriented to person, place, and time. She appears well-developed and well-nourished. No distress.  Neurological: She is alert and oriented to person, place, and time.  Skin: Skin is dry.  Psychiatric: She has a normal mood and affect. Her behavior is normal. Thought content normal.          Assessment & Plan:

## 2012-07-19 NOTE — Telephone Encounter (Signed)
cancelled patient appointment surgery is not till 08-20-2012

## 2012-07-23 ENCOUNTER — Encounter: Payer: Self-pay | Admitting: *Deleted

## 2012-07-26 ENCOUNTER — Telehealth: Payer: Self-pay | Admitting: Oncology

## 2012-07-26 NOTE — Telephone Encounter (Signed)
s.w. pt and advised on 3.10.14 appt....pt ok and aware

## 2012-08-06 ENCOUNTER — Encounter (HOSPITAL_COMMUNITY): Payer: Self-pay | Admitting: Pharmacy Technician

## 2012-08-10 ENCOUNTER — Ambulatory Visit: Payer: Managed Care, Other (non HMO) | Admitting: Oncology

## 2012-08-12 ENCOUNTER — Telehealth (INDEPENDENT_AMBULATORY_CARE_PROVIDER_SITE_OTHER): Payer: Self-pay | Admitting: General Surgery

## 2012-08-12 NOTE — Telephone Encounter (Signed)
Called patient back based on message left with Liborio Nixon. Patient stated that she did not go into work yesterday and called out today due to her being stressed over the pre-op and surgery that she is having on 08/20/12. 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 She wanted to know if a letter could be issued for her having been out of work. I advised that an out of work letter for stress is not something that would be issued. I advised her to contact her PCP if needed to see if he may be able to issue. The patient stated that her manager told her co-workers that she was having surgery and going to be out of work for 6 months. She is dealing with co-workers that are coming up to her asking questions all of which is adding to her stress. I suggested she talk to her manager and advise that what she is dealing with is private and was not up to her to tell her co-workers. I suggested finding ways to let go of the stress and not to allow the stress to take over her because her body needs to be as stress free as possible considering what she has to fight for right now. I asked the patient if she understood why and what I was telling her and she said yes.

## 2012-08-13 ENCOUNTER — Encounter (HOSPITAL_COMMUNITY): Payer: Self-pay

## 2012-08-13 ENCOUNTER — Encounter (HOSPITAL_COMMUNITY)
Admission: RE | Admit: 2012-08-13 | Discharge: 2012-08-13 | Disposition: A | Discharge status: Home / Self Care | Payer: No Typology Code available for payment source | Source: Ambulatory Visit | Attending: General Surgery | Admitting: General Surgery

## 2012-08-13 HISTORY — DX: Personal history of other infectious and parasitic diseases: Z86.19

## 2012-08-13 HISTORY — DX: Allergy status to unspecified drugs, medicaments and biological substances: Z88.9

## 2012-08-13 HISTORY — DX: Other constipation: K59.09

## 2012-08-13 HISTORY — DX: Diverticulitis of intestine, part unspecified, without perforation or abscess without bleeding: K57.92

## 2012-08-13 HISTORY — DX: Personal history of other medical treatment: Z92.89

## 2012-08-13 LAB — CBC WITH DIFFERENTIAL/PLATELET
Basophils Absolute: 0 10*3/uL (ref 0.0–0.1)
Basophils Relative: 0 % (ref 0–1)
Eosinophils Relative: 2 % (ref 0–5)
HCT: 40 % (ref 36.0–46.0)
MCHC: 33.8 g/dL (ref 30.0–36.0)
Monocytes Absolute: 0.5 10*3/uL (ref 0.1–1.0)
Neutro Abs: 3.1 10*3/uL (ref 1.7–7.7)
Platelets: 202 10*3/uL (ref 150–400)
RDW: 12.6 % (ref 11.5–15.5)
WBC: 6.3 10*3/uL (ref 4.0–10.5)

## 2012-08-13 LAB — COMPREHENSIVE METABOLIC PANEL
ALT: 12 U/L (ref 0–35)
AST: 16 U/L (ref 0–37)
Albumin: 4.2 g/dL (ref 3.5–5.2)
Calcium: 10.2 mg/dL (ref 8.4–10.5)
Creatinine, Ser: 0.68 mg/dL (ref 0.50–1.10)
Sodium: 139 mEq/L (ref 135–145)

## 2012-08-13 LAB — SURGICAL PCR SCREEN
MRSA, PCR: NEGATIVE
Staphylococcus aureus: NEGATIVE

## 2012-08-13 LAB — URINALYSIS, ROUTINE W REFLEX MICROSCOPIC
Glucose, UA: NEGATIVE mg/dL
Hgb urine dipstick: NEGATIVE
Specific Gravity, Urine: 1.019 (ref 1.005–1.030)
pH: 6 (ref 5.0–8.0)

## 2012-08-13 MED ORDER — CHLORHEXIDINE GLUCONATE 4 % EX LIQD: 1.0000 "application " | Freq: Once | CUTANEOUS | Status: DC

## 2012-08-13 NOTE — Progress Notes (Signed)
Pt doesn't have a cardiologist  Stress test done at least 23yrs ago and was done bc at that time had a rapid heart rate and found out it was graves disease  Denies ever having an echo or heart cath  Dr.Katherine Beverely Low is Medical MD  EKG and CXR in epic from 02/2012

## 2012-08-13 NOTE — Pre-Procedure Instructions (Signed)
Trust Schlachter  08/13/2012   Your procedure is scheduled on:  Fri, Feb 14 @ 7:30 AM  Report to [facility] at 5:30 AM.  Call this number if you have problems the morning of surgery: [phone]   Remember:   Do not eat food or drink liquids after midnight.   Take these medicines the morning of surgery with A SIP OF WATER: Alprazolam(Xanax),Wellbutrin(Bupropion),Zyrtec(Cetirizine),and Synthroid(Levothyroxine)   Do not wear jewelry, make-up or nail polish.  Do not wear lotions, powders, or perfumes. You may wear deodorant.  Do not shave 48 hours prior to surgery.  Do not bring valuables to the hospital.  Contacts, dentures or bridgework may not be worn into surgery.  Leave suitcase in the car. After surgery it may be brought to your room.  For patients admitted to the hospital, checkout time is 11:00 AM the day of  discharge.   Patients discharged the day of surgery will not be allowed to drive  home.  Special Instructions: Shower using CHG 2 nights before surgery and the night before surgery.  If you shower the day of surgery use CHG.  Use special wash - you have one bottle of CHG for all showers.  You should use approximately 1/3 of the bottle for each shower.   Please read over the following fact sheets that you were given: Pain Booklet, Coughing and Deep Breathing, MRSA Information and Surgical Site Infection Prevention

## 2012-08-19 MED ORDER — CHLORHEXIDINE GLUCONATE 4 % EX LIQD: 1.0000 "application " | Freq: Once | CUTANEOUS | Status: DC

## 2012-08-19 MED ORDER — CEFAZOLIN SODIUM-DEXTROSE 2-3 GM-% IV SOLR
2.0000 g | INTRAVENOUS | Status: AC
  Administered 2012-08-20: 2 g via INTRAVENOUS
  Administered 2012-08-20: 1 g via INTRAVENOUS
  Filled 2012-08-19: qty 50
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
## 2012-08-20 ENCOUNTER — Inpatient Hospital Stay (HOSPITAL_COMMUNITY)
Admission: RE | Admit: 2012-08-20 | Discharge: 2012-08-25 | DRG: 579 | Disposition: A | Discharge status: Home / Self Care | Payer: No Typology Code available for payment source | Source: Ambulatory Visit | Attending: General Surgery | Admitting: General Surgery

## 2012-08-20 ENCOUNTER — Encounter (HOSPITAL_COMMUNITY): Payer: Self-pay | Admitting: *Deleted

## 2012-08-20 ENCOUNTER — Ambulatory Visit (HOSPITAL_COMMUNITY): Payer: No Typology Code available for payment source | Admitting: Anesthesiology

## 2012-08-20 ENCOUNTER — Encounter (HOSPITAL_COMMUNITY): Payer: Self-pay | Admitting: Anesthesiology

## 2012-08-20 ENCOUNTER — Encounter (HOSPITAL_COMMUNITY)
Admission: RE | Disposition: A | Discharge status: Home / Self Care | Payer: Self-pay | Source: Ambulatory Visit | Attending: General Surgery

## 2012-08-20 ENCOUNTER — Encounter (HOSPITAL_COMMUNITY)
Admission: RE | Admit: 2012-08-20 | Discharge: 2012-08-20 | Disposition: A | Discharge status: Home / Self Care | Payer: No Typology Code available for payment source | Source: Ambulatory Visit | Attending: General Surgery | Admitting: General Surgery

## 2012-08-20 DIAGNOSIS — R059 Cough, unspecified: Secondary | ICD-10-CM

## 2012-08-20 DIAGNOSIS — D0590 Unspecified type of carcinoma in situ of unspecified breast: Principal | ICD-10-CM | POA: Diagnosis present

## 2012-08-20 DIAGNOSIS — R509 Fever, unspecified: Secondary | ICD-10-CM

## 2012-08-20 DIAGNOSIS — Y836 Removal of other organ (partial) (total) as the cause of abnormal reaction of the patient, or of later complication, without mention of misadventure at the time of the procedure: Secondary | ICD-10-CM | POA: Diagnosis not present

## 2012-08-20 DIAGNOSIS — F3289 Other specified depressive episodes: Secondary | ICD-10-CM | POA: Diagnosis present

## 2012-08-20 DIAGNOSIS — K5190 Ulcerative colitis, unspecified, without complications: Secondary | ICD-10-CM | POA: Diagnosis present

## 2012-08-20 DIAGNOSIS — J988 Other specified respiratory disorders: Secondary | ICD-10-CM | POA: Diagnosis not present

## 2012-08-20 DIAGNOSIS — F411 Generalized anxiety disorder: Secondary | ICD-10-CM | POA: Diagnosis present

## 2012-08-20 DIAGNOSIS — J9819 Other pulmonary collapse: Secondary | ICD-10-CM | POA: Diagnosis not present

## 2012-08-20 DIAGNOSIS — E876 Hypokalemia: Secondary | ICD-10-CM | POA: Diagnosis not present

## 2012-08-20 DIAGNOSIS — F329 Major depressive disorder, single episode, unspecified: Secondary | ICD-10-CM

## 2012-08-20 DIAGNOSIS — J189 Pneumonia, unspecified organism: Secondary | ICD-10-CM | POA: Diagnosis not present

## 2012-08-20 DIAGNOSIS — C50219 Malignant neoplasm of upper-inner quadrant of unspecified female breast: Secondary | ICD-10-CM

## 2012-08-20 DIAGNOSIS — Z79899 Other long term (current) drug therapy: Secondary | ICD-10-CM

## 2012-08-20 DIAGNOSIS — E039 Hypothyroidism, unspecified: Secondary | ICD-10-CM | POA: Diagnosis present

## 2012-08-20 DIAGNOSIS — R05 Cough: Secondary | ICD-10-CM

## 2012-08-20 DIAGNOSIS — E785 Hyperlipidemia, unspecified: Secondary | ICD-10-CM

## 2012-08-20 DIAGNOSIS — J45909 Unspecified asthma, uncomplicated: Secondary | ICD-10-CM | POA: Diagnosis present

## 2012-08-20 DIAGNOSIS — D649 Anemia, unspecified: Secondary | ICD-10-CM | POA: Diagnosis not present

## 2012-08-20 HISTORY — PX: TISSUE EXPANDER PLACEMENT: SHX2530

## 2012-08-20 HISTORY — PX: SIMPLE MASTECTOMY WITH AXILLARY SENTINEL NODE BIOPSY: SHX6098

## 2012-08-20 HISTORY — PX: TOTAL MASTECTOMY: SHX6129

## 2012-08-20 LAB — GLUCOSE, CAPILLARY

## 2012-08-20 SURGERY — SIMPLE MASTECTOMY WITH AXILLARY SENTINEL NODE BIOPSY
Anesthesia: General | Site: Breast | Laterality: Right | Wound class: Clean

## 2012-08-20 MED ORDER — DEXTROSE-NACL 5-0.45 % IV SOLN
INTRAVENOUS | Status: DC
  Administered 2012-08-20 – 2012-08-22 (×3): via INTRAVENOUS

## 2012-08-20 MED ORDER — LIDOCAINE HCL 4 % MT SOLN
OROMUCOSAL | Status: DC | PRN
  Administered 2012-08-20: 4 mL via TOPICAL

## 2012-08-20 MED ORDER — DEXTROSE 5 % IV SOLN
INTRAVENOUS | Status: DC | PRN
  Administered 2012-08-20: 08:00:00 via INTRAVENOUS

## 2012-08-20 MED ORDER — HYDROMORPHONE HCL 2 MG PO TABS
4.0000 mg | ORAL_TABLET | ORAL | Status: DC | PRN
  Administered 2012-08-21 – 2012-08-22 (×6): 4 mg via ORAL
  Filled 2012-08-20 (×7): qty 2

## 2012-08-20 MED ORDER — MIDAZOLAM HCL 2 MG/2ML IJ SOLN
INTRAMUSCULAR | Status: AC
  Administered 2012-08-20: 2 mg via INTRAVENOUS
  Filled 2012-08-20: qty 2

## 2012-08-20 MED ORDER — BUPROPION HCL ER (XL) 150 MG PO TB24
150.0000 mg | ORAL_TABLET | Freq: Every day | ORAL | Status: DC
  Administered 2012-08-20 – 2012-08-25 (×6): 150 mg via ORAL
  Filled 2012-08-20 (×7): qty 1

## 2012-08-20 MED ORDER — LIDOCAINE HCL (CARDIAC) 20 MG/ML IV SOLN
INTRAVENOUS | Status: DC | PRN
  Administered 2012-08-20: 80 mg via INTRAVENOUS

## 2012-08-20 MED ORDER — PROMETHAZINE HCL 25 MG/ML IJ SOLN
6.2500 mg | INTRAMUSCULAR | Status: DC | PRN
  Administered 2012-08-20: 12.5 mg via INTRAVENOUS

## 2012-08-20 MED ORDER — MEPERIDINE HCL 25 MG/ML IJ SOLN: 6.2500 mg | INTRAMUSCULAR | Status: DC | PRN

## 2012-08-20 MED ORDER — DEXAMETHASONE SODIUM PHOSPHATE 4 MG/ML IJ SOLN
INTRAMUSCULAR | Status: DC | PRN
  Administered 2012-08-20: 8 mg via INTRAVENOUS

## 2012-08-20 MED ORDER — NEOSTIGMINE METHYLSULFATE 1 MG/ML IJ SOLN
INTRAMUSCULAR | Status: DC | PRN
  Administered 2012-08-20: 3 mg via INTRAVENOUS

## 2012-08-20 MED ORDER — MIDAZOLAM HCL 2 MG/2ML IJ SOLN: 0.5000 mg | Freq: Once | INTRAMUSCULAR | Status: DC | PRN

## 2012-08-20 MED ORDER — FENTANYL CITRATE 0.05 MG/ML IJ SOLN
INTRAMUSCULAR | Status: AC
  Filled 2012-08-20: qty 2

## 2012-08-20 MED ORDER — SODIUM CHLORIDE 0.9 % IR SOLN
Status: DC | PRN
  Administered 2012-08-20: 12:00:00

## 2012-08-20 MED ORDER — EPHEDRINE SULFATE 50 MG/ML IJ SOLN
INTRAMUSCULAR | Status: DC | PRN
  Administered 2012-08-20 (×2): 10 mg via INTRAVENOUS
  Administered 2012-08-20: 20 mg via INTRAVENOUS
  Administered 2012-08-20: 10 mg via INTRAVENOUS

## 2012-08-20 MED ORDER — HYDROMORPHONE HCL PF 1 MG/ML IJ SOLN
1.0000 mg | INTRAMUSCULAR | Status: DC | PRN
Start: 2012-08-20 — End: 2012-08-21
  Administered 2012-08-20 – 2012-08-21 (×6): 1 mg via INTRAVENOUS
  Filled 2012-08-20 (×6): qty 1

## 2012-08-20 MED ORDER — CEFAZOLIN SODIUM 1-5 GM-% IV SOLN
INTRAVENOUS | Status: AC
  Filled 2012-08-20: qty 50

## 2012-08-20 MED ORDER — KETOROLAC TROMETHAMINE 0.5 % OP SOLN
1.0000 [drp] | Freq: Four times a day (QID) | OPHTHALMIC | Status: DC
  Administered 2012-08-20 – 2012-08-25 (×19): 1 [drp] via OPHTHALMIC
  Filled 2012-08-20: qty 3

## 2012-08-20 MED ORDER — HYDROMORPHONE HCL PF 1 MG/ML IJ SOLN
INTRAMUSCULAR | Status: AC
  Administered 2012-08-20: 1 mg
  Filled 2012-08-20: qty 1

## 2012-08-20 MED ORDER — SODIUM CHLORIDE 0.9 % IJ SOLN
INTRAMUSCULAR | Status: DC | PRN
  Administered 2012-08-20: 08:00:00

## 2012-08-20 MED ORDER — LEVOTHYROXINE SODIUM 100 MCG PO TABS
100.0000 ug | ORAL_TABLET | Freq: Every day | ORAL | Status: DC
  Administered 2012-08-21 – 2012-08-25 (×4): 100 ug via ORAL
  Filled 2012-08-20 (×6): qty 1

## 2012-08-20 MED ORDER — METHYLENE BLUE 1 % INJ SOLN
INTRAMUSCULAR | Status: AC
  Filled 2012-08-20: qty 10

## 2012-08-20 MED ORDER — FENTANYL CITRATE 0.05 MG/ML IJ SOLN
INTRAMUSCULAR | Status: DC | PRN
  Administered 2012-08-20: 125 ug via INTRAVENOUS
  Administered 2012-08-20: 100 ug via INTRAVENOUS
  Administered 2012-08-20: 50 ug via INTRAVENOUS
  Administered 2012-08-20: 75 ug via INTRAVENOUS

## 2012-08-20 MED ORDER — SODIUM CHLORIDE 0.9 % IR SOLN
Status: DC | PRN
  Administered 2012-08-20: 10:00:00

## 2012-08-20 MED ORDER — SODIUM CHLORIDE 0.9 % IR SOLN
Status: DC | PRN
  Administered 2012-08-20: 1000 mL

## 2012-08-20 MED ORDER — ACETAMINOPHEN 10 MG/ML IV SOLN
INTRAVENOUS | Status: AC
  Administered 2012-08-20: 1000 mg via INTRAVENOUS
  Filled 2012-08-20: qty 100

## 2012-08-20 MED ORDER — ALPRAZOLAM 0.25 MG PO TABS
0.2500 mg | ORAL_TABLET | Freq: Three times a day (TID) | ORAL | Status: DC | PRN
  Administered 2012-08-21: 0.25 mg via ORAL
  Filled 2012-08-20 (×2): qty 1

## 2012-08-20 MED ORDER — DIPHENHYDRAMINE HCL 50 MG/ML IJ SOLN: 12.5000 mg | Freq: Four times a day (QID) | INTRAMUSCULAR | Status: DC | PRN

## 2012-08-20 MED ORDER — PROMETHAZINE HCL 25 MG/ML IJ SOLN
6.2500 mg | INTRAMUSCULAR | Status: DC | PRN
  Administered 2012-08-22: 6.25 mg via INTRAVENOUS
  Filled 2012-08-20: qty 1

## 2012-08-20 MED ORDER — PROPOFOL 10 MG/ML IV BOLUS
INTRAVENOUS | Status: DC | PRN
  Administered 2012-08-20: 180 mg via INTRAVENOUS

## 2012-08-20 MED ORDER — DOCUSATE SODIUM 100 MG PO CAPS: 100.0000 mg | ORAL_CAPSULE | Freq: Every day | ORAL | Status: DC

## 2012-08-20 MED ORDER — PHENYLEPHRINE HCL 10 MG/ML IJ SOLN
INTRAMUSCULAR | Status: DC | PRN
  Administered 2012-08-20 (×4): 40 ug via INTRAVENOUS
  Administered 2012-08-20: 80 ug via INTRAVENOUS
  Administered 2012-08-20: 40 ug via INTRAVENOUS

## 2012-08-20 MED ORDER — 0.9 % SODIUM CHLORIDE (POUR BTL) OPTIME
TOPICAL | Status: DC | PRN
  Administered 2012-08-20 (×5): 1000 mL

## 2012-08-20 MED ORDER — SODIUM CHLORIDE 0.9 % IJ SOLN: 9.0000 mL | INTRAMUSCULAR | Status: DC | PRN

## 2012-08-20 MED ORDER — PROMETHAZINE HCL 25 MG/ML IJ SOLN
INTRAMUSCULAR | Status: AC
  Filled 2012-08-20: qty 1

## 2012-08-20 MED ORDER — METHOCARBAMOL 500 MG PO TABS
500.0000 mg | ORAL_TABLET | Freq: Four times a day (QID) | ORAL | Status: DC
  Administered 2012-08-20 – 2012-08-25 (×19): 500 mg via ORAL
  Filled 2012-08-20 (×22): qty 1

## 2012-08-20 MED ORDER — HYDROMORPHONE HCL PF 1 MG/ML IJ SOLN
INTRAMUSCULAR | Status: AC
  Filled 2012-08-20: qty 1

## 2012-08-20 MED ORDER — DEXTROSE 5 % IV SOLN
INTRAVENOUS | Status: DC | PRN
  Administered 2012-08-20 (×2): via INTRAVENOUS

## 2012-08-20 MED ORDER — ROCURONIUM BROMIDE 100 MG/10ML IV SOLN
INTRAVENOUS | Status: DC | PRN
  Administered 2012-08-20: 15 mg via INTRAVENOUS
  Administered 2012-08-20: 10 mg via INTRAVENOUS
  Administered 2012-08-20: 35 mg via INTRAVENOUS

## 2012-08-20 MED ORDER — ARTIFICIAL TEARS OP OINT
TOPICAL_OINTMENT | OPHTHALMIC | Status: DC | PRN
  Administered 2012-08-20: 1 via OPHTHALMIC

## 2012-08-20 MED ORDER — TECHNETIUM TC 99M SULFUR COLLOID FILTERED
1.0000 | Freq: Once | INTRAVENOUS | Status: AC | PRN
  Administered 2012-08-20: 1 via INTRADERMAL

## 2012-08-20 MED ORDER — DIPHENHYDRAMINE HCL 12.5 MG/5ML PO ELIX
12.5000 mg | ORAL_SOLUTION | Freq: Four times a day (QID) | ORAL | Status: DC | PRN
  Filled 2012-08-20: qty 5

## 2012-08-20 MED ORDER — NALOXONE HCL 0.4 MG/ML IJ SOLN: 0.4000 mg | INTRAMUSCULAR | Status: DC | PRN

## 2012-08-20 MED ORDER — FENTANYL CITRATE 0.05 MG/ML IJ SOLN
25.0000 ug | INTRAMUSCULAR | Status: DC | PRN
  Administered 2012-08-20: 14:00:00 via INTRAVENOUS
  Administered 2012-08-20 (×2): 50 ug via INTRAVENOUS

## 2012-08-20 MED ORDER — LACTATED RINGERS IV SOLN
INTRAVENOUS | Status: DC | PRN
  Administered 2012-08-20 (×4): via INTRAVENOUS

## 2012-08-20 MED ORDER — ONDANSETRON HCL 4 MG/2ML IJ SOLN
INTRAMUSCULAR | Status: DC | PRN
  Administered 2012-08-20 (×2): 4 mg via INTRAVENOUS

## 2012-08-20 MED ORDER — ONDANSETRON HCL 4 MG/2ML IJ SOLN: 4.0000 mg | Freq: Four times a day (QID) | INTRAMUSCULAR | Status: DC | PRN

## 2012-08-20 MED ORDER — CEFAZOLIN SODIUM 1-5 GM-% IV SOLN
1.0000 g | Freq: Three times a day (TID) | INTRAVENOUS | Status: DC
  Administered 2012-08-20 – 2012-08-22 (×5): 1 g via INTRAVENOUS
  Filled 2012-08-20 (×8): qty 50

## 2012-08-20 MED ORDER — HYDROMORPHONE 0.3 MG/ML IV SOLN: INTRAVENOUS | Status: DC

## 2012-08-20 MED ORDER — GLYCOPYRROLATE 0.2 MG/ML IJ SOLN
INTRAMUSCULAR | Status: DC | PRN
  Administered 2012-08-20: 0.4 mg via INTRAVENOUS

## 2012-08-20 SURGICAL SUPPLY — 79 items
ADH SKN CLS APL DERMABOND .7 (GAUZE/BANDAGES/DRESSINGS) ×6
APPLIER CLIP 9.375 MED OPEN (MISCELLANEOUS) ×4
APR CLP MED 9.3 20 MLT OPN (MISCELLANEOUS) ×3
ATCH SMKEVC FLXB CAUT HNDSWH (FILTER) ×3 IMPLANT
BAG DECANTER FOR FLEXI CONT (MISCELLANEOUS) ×5 IMPLANT
BINDER BREAST LRG (GAUZE/BANDAGES/DRESSINGS) IMPLANT
BINDER BREAST XLRG (GAUZE/BANDAGES/DRESSINGS) ×1 IMPLANT
BINDER BREAST XXLRG (GAUZE/BANDAGES/DRESSINGS) IMPLANT
BIOPATCH RED 1 DISK 7.0 (GAUZE/BANDAGES/DRESSINGS) ×10 IMPLANT
CANISTER SUCTION 2500CC (MISCELLANEOUS) ×8 IMPLANT
CHLORAPREP W/TINT 26ML (MISCELLANEOUS) ×8 IMPLANT
CLIP APPLIE 9.375 MED OPEN (MISCELLANEOUS) ×3 IMPLANT
CLOTH BEACON ORANGE TIMEOUT ST (SAFETY) ×8 IMPLANT
CONT SPEC 4OZ CLIKSEAL STRL BL (MISCELLANEOUS) ×8 IMPLANT
COVER PROBE W GEL 5X96 (DRAPES) ×4 IMPLANT
COVER SURGICAL LIGHT HANDLE (MISCELLANEOUS) ×8 IMPLANT
DERMABOND ADVANCED (GAUZE/BANDAGES/DRESSINGS) ×2
DERMABOND ADVANCED .7 DNX12 (GAUZE/BANDAGES/DRESSINGS) ×6 IMPLANT
DRAIN CHANNEL 19F RND (DRAIN) ×13 IMPLANT
DRAPE CHEST BREAST 15X10 FENES (DRAPES) ×4 IMPLANT
DRAPE LAPAROSCOPIC ABDOMINAL (DRAPES) ×3 IMPLANT
DRAPE ORTHO SPLIT 77X108 STRL (DRAPES) ×8
DRAPE PROXIMA HALF (DRAPES) ×12 IMPLANT
DRAPE SURG 17X23 STRL (DRAPES) ×10 IMPLANT
DRAPE SURG ORHT 6 SPLT 77X108 (DRAPES) ×6 IMPLANT
DRAPE UTILITY 15X26 W/TAPE STR (DRAPE) ×9 IMPLANT
DRAPE WARM FLUID 44X44 (DRAPE) ×4 IMPLANT
DRSG PAD ABDOMINAL 8X10 ST (GAUZE/BANDAGES/DRESSINGS) ×8 IMPLANT
DRSG TEGADERM 4X4.75 (GAUZE/BANDAGES/DRESSINGS) ×8 IMPLANT
ELECT BLADE 4.0 EZ CLEAN MEGAD (MISCELLANEOUS) ×8
ELECT BLADE 6.5 EXT (BLADE) ×1 IMPLANT
ELECT CAUTERY BLADE 6.4 (BLADE) ×9 IMPLANT
ELECT REM PT RETURN 9FT ADLT (ELECTROSURGICAL) ×8
ELECTRODE BLDE 4.0 EZ CLN MEGD (MISCELLANEOUS) ×3 IMPLANT
ELECTRODE REM PT RTRN 9FT ADLT (ELECTROSURGICAL) ×6 IMPLANT
EVACUATOR SILICONE 100CC (DRAIN) ×13 IMPLANT
EVACUATOR SMOKE ACCUVAC VALLEY (FILTER) ×2
EXPANDER BREAST TISSUE 650CC (Breast) ×2 IMPLANT
GLOVE BIO SURGEON STRL SZ7.5 (GLOVE) ×7 IMPLANT
GLOVE BIOGEL PI IND STRL 6.5 (GLOVE) IMPLANT
GLOVE BIOGEL PI IND STRL 7.5 (GLOVE) IMPLANT
GLOVE BIOGEL PI IND STRL 8 (GLOVE) ×3 IMPLANT
GLOVE BIOGEL PI INDICATOR 6.5 (GLOVE) ×2
GLOVE BIOGEL PI INDICATOR 7.5 (GLOVE) ×3
GLOVE BIOGEL PI INDICATOR 8 (GLOVE) ×1
GLOVE ECLIPSE 6.5 STRL STRAW (GLOVE) ×3 IMPLANT
GLOVE EUDERMIC 7 POWDERFREE (GLOVE) ×4 IMPLANT
GOWN PREVENTION PLUS XLARGE (GOWN DISPOSABLE) ×8 IMPLANT
GOWN STRL NON-REIN LRG LVL3 (GOWN DISPOSABLE) ×14 IMPLANT
KIT BASIN OR (CUSTOM PROCEDURE TRAY) ×8 IMPLANT
KIT ROOM TURNOVER OR (KITS) ×7 IMPLANT
MARKER SKIN DUAL TIP RULER LAB (MISCELLANEOUS) ×4 IMPLANT
NDL 18GX1X1/2 (RX/OR ONLY) (NEEDLE) ×3 IMPLANT
NDL HYPO 25GX1X1/2 BEV (NEEDLE) ×3 IMPLANT
NEEDLE 18GX1X1/2 (RX/OR ONLY) (NEEDLE) ×4 IMPLANT
NEEDLE HYPO 25GX1X1/2 BEV (NEEDLE) ×4 IMPLANT
NS IRRIG 1000ML POUR BTL (IV SOLUTION) ×14 IMPLANT
PACK GENERAL/GYN (CUSTOM PROCEDURE TRAY) ×8 IMPLANT
PAD ARMBOARD 7.5X6 YLW CONV (MISCELLANEOUS) ×8 IMPLANT
PENCIL BUTTON HOLSTER BLD 10FT (ELECTRODE) ×1 IMPLANT
PREFILTER EVAC NS 1 1/3-3/8IN (MISCELLANEOUS) ×4 IMPLANT
SET ASEPTIC TRANSFER (MISCELLANEOUS) ×2 IMPLANT
SPECIMEN JAR X LARGE (MISCELLANEOUS) ×5 IMPLANT
SPONGE LAP 18X18 X RAY DECT (DISPOSABLE) ×2 IMPLANT
STAPLER VISISTAT 35W (STAPLE) ×4 IMPLANT
SUT ETHILON 3 0 FSL (SUTURE) ×5 IMPLANT
SUT MNCRL AB 3-0 PS2 18 (SUTURE) ×18 IMPLANT
SUT MNCRL AB 4-0 PS2 18 (SUTURE) ×4 IMPLANT
SUT PDS AB 3-0 SH 27 (SUTURE) IMPLANT
SUT PROLENE 3 0 PS 2 (SUTURE) ×12 IMPLANT
SUT SILK 2 0 FS (SUTURE) ×4 IMPLANT
SUT VIC AB 3-0 SH 18 (SUTURE) ×9 IMPLANT
SYR BULB IRRIGATION 50ML (SYRINGE) ×4 IMPLANT
SYR CONTROL 10ML LL (SYRINGE) ×4 IMPLANT
TOWEL OR 17X24 6PK STRL BLUE (TOWEL DISPOSABLE) ×8 IMPLANT
TOWEL OR 17X26 10 PK STRL BLUE (TOWEL DISPOSABLE) ×8 IMPLANT
TRAY FOLEY CATH 14FRSI W/METER (CATHETERS) ×1 IMPLANT
TUBE CONNECTING 12X1/4 (SUCTIONS) ×4 IMPLANT
WATER STERILE IRR 1000ML POUR (IV SOLUTION) IMPLANT

## 2012-08-20 NOTE — Anesthesia Postprocedure Evaluation (Addendum)
  Anesthesia Post-op Note  Patient: [name] [name]  Procedure(s) Performed: Procedure(s) (LRB): SIMPLE MASTECTOMY WITH AXILLARY SENTINEL NODE BIOPSY (Left) TOTAL MASTECTOMY (Right) TISSUE EXPANDER (Bilateral)  Patient Location: PACU  Anesthesia Type: General  Level of Consciousness: awake and alert   Airway and Oxygen Therapy: Patient Spontanous Breathing  Post-op Pain: mild  Post-op Assessment: Post-op Vital signs reviewed, Patient's Cardiovascular Status Stable, Respiratory Function Stable, Patent Airway and No signs of Nausea or vomiting  Last Vitals:  Filed Vitals:   08/20/12 1449  BP:   Pulse: 99  Temp:   Resp: 13    Post-op Vital Signs: stable   Complications: Patient complaining of R eye pain/grittiness. Upon exam no FB seen. Orbit is slightly injected. Probable corneal abrasion. Will patch eye overnight and give ketorolac eyedrops. Recommend opthalmology consult tomorrow if no improvement. Dr Heide Guile

## 2012-08-20 NOTE — Preoperative (Signed)
Beta Blockers   Reason not to administer Beta Blockers:Not Applicable 

## 2012-08-20 NOTE — Progress Notes (Signed)
Pt c/o rt eye is sore and there feels like something is in it.  Eye is reddened. Will alert MD

## 2012-08-20 NOTE — Progress Notes (Signed)
Dr Acey Lav states pt is to wear a rt eye patch overnight, and receive ketorlac gtts.

## 2012-08-20 NOTE — Op Note (Signed)
Patient Name:           [name] [name]   Date of Surgery:        08/20/2012  Pre op Diagnosis:      High grade DCIS left breast, upper inner quadrant, extensive, stage Tis, N0  Post op Diagnosis:    Same  Procedure:                 Inject blue dye left breast, bilateral total mastectomy, left axillary sentinel lymph node biopsy  Surgeon:                     [name] [name]. Derrell Lolling, M.D., FACS  Assistant:                      Magnus Ivan, RNFA  Operative Indications:   [name] [name] is a 52 y.o. female.   This patient was originally seen in the [facility] by me, Dr. Darnelle [name], and Dr. Dayton [name]. Mammograms and MRI showed an area of calcifications and linear enhancement in the left breast, upper inner quadrant. This is arranged in an anterior to posterior arrangement, and the area appears to be 8 cm x 3 cm x 2 cm. We now have 2 biopsies, one of the most posterior extent, and one of the most anterior extent. Both of these show high-grade DCIS, ER positive, PR negative.   I spent a long time talking with the patient and her daughter A. I discussed the issues of lumpectomy, mastectomy, sentinel node biopsy, immediate and delayed reconstruction. I told her that she is a lumpectomy candidate, although she will be somewhat more technically difficult because of the three-dimensional arrangement of her DCIS. She knows that she may have microinvasive disease. She knows that she might have to have a second operation if the margins are positive. We talked about these issues at great length. She was referred to Dr. Etter Sjogren, plastic surgery. At the end of that conversation she elected to have bilateral mastectomy and left axillary sentinel node biopsy.    Operative Findings:       Her breasts were large. There was no palpable abnormality in either breast or in either axillary area. I found 2 sentinel lymph nodes in the left axilla that took up the radionuclide and the blue dye.  Procedure in Detail:          The  patient was brought to the holding area at [facility] hospital and underwent injection of radionuclide into the left breast by the nuclear medicine technician. She was taken to the operating room where general anesthesia was induced. A timeout was held. Following alcohol prep I injected 5 cc of blue dye into the left breast, subareolar area and massaged the breast for 5 minutes. Intravenous antibiotics were given. Both breasts were prepped and draped in a sterile fashion. A surgical time out was held.  Using a marking pen I marked the anatomical boundaries of the breast and bilateral elliptical incision to encompass both nipples. I performed a right total mastectomy first. I made the elliptical incision. Skin flaps were raised superiorly to the infraclavicular area, medially to the parasternal area, inferiorly to the inframammary crease and anterior rectus sheath and laterally to the latissimus dorsi muscle. The breast was then dissected off of the pectoralis major and pectoralis minor muscles . It was marked with a silk suture at the lateral skin margin and sent to pathology. Hemostasis was excellent and achieved with electrocautery. Wound was  irrigated with saline and packed with saline sponges.  I turned my attention to the left mastectomy and performed a mirror image elliptical incision. Skin flaps were raised superiorly, medially, inferiorly, and laterally in a similar fashion. Using the neoprobe I then dissected up into the axilla and found two obvious sentinel lymph nodes which were hot and blue. After these were removed there were no other lymph nodes involved. The left breast was dissected off of the pectoralis major and minor muscles. The lateral skin edge was marked with a silk suture and the specimen was sent to lab. This was also irrigated with saline. Hemostasis was excellent it was packed with a sponge.  At this point we had probably lost 50-75 cc of blood. Counts were correct. The patient was stable.  The control of the operative procedure at this point was  turned over to Dr. Etter Sjogren. He will dictate his reconstructive procedure separately.     [name] [name]. Derrell Lolling, M.D., FACS General and Minimally Invasive Surgery Breast and Colorectal Surgery  08/20/2012 9:58 AM

## 2012-08-20 NOTE — Brief Op Note (Signed)
08/20/2012  12:48 PM  PATIENT:  [name] [name]  52 y.o. female  PRE-OPERATIVE DIAGNOSIS:  left breast cancer  POST-OPERATIVE DIAGNOSIS:  left breast cancer  PROCEDURE:  Procedure(s) with comments: SIMPLE MASTECTOMY WITH AXILLARY SENTINEL NODE BIOPSY (Left) TOTAL MASTECTOMY (Right) TISSUE EXPANDER (Bilateral) - Bilateral Tissue Expanders with Possible HD Flex  SURGEON:  Surgeon(s) and Role: Panel 1:    * Ernestene Mention, MD - Primary  Panel 2:    * Etter Sjogren, MD - Primary  PHYSICIAN ASSISTANT:   ASSISTANTS: none   ANESTHESIA:   general  EBL:  Total I/O In: 3200 [I.V.:3200] Out: 855 [Urine:605; Blood:250]  BLOOD ADMINISTERED:none  DRAINS: (4) Jackson-Pratt drain(s) with closed bulb suction in the left (2) and right (2) chest space   LOCAL MEDICATIONS USED:  NONE  SPECIMEN:  Source of Specimen:  left mastectomy skin flaps  DISPOSITION OF SPECIMEN:  PATHOLOGY  COUNTS:  YES  TOURNIQUET:  * No tourniquets in log *  DICTATION: .Other Dictation: Dictation Number [id number]  PLAN OF CARE: Admit for overnight observation  PATIENT DISPOSITION:  PACU - hemodynamically stable.   Delay start of Pharmacological VTE agent (>24hrs) due to surgical blood loss or risk of bleeding: no

## 2012-08-20 NOTE — Transfer of Care (Signed)
Immediate Anesthesia Transfer of Care Note  Patient: [name] [name]  Procedure(s) Performed: Procedure(s) with comments: SIMPLE MASTECTOMY WITH AXILLARY SENTINEL NODE BIOPSY (Left) TOTAL MASTECTOMY (Right) TISSUE EXPANDER (Bilateral) - Bilateral Tissue Expanders with Possible HD Flex  Patient Location: PACU  Anesthesia Type:General  Level of Consciousness: awake, alert , oriented and patient cooperative  Airway & Oxygen Therapy: Patient Spontanous Breathing and Patient connected to nasal cannula oxygen  Post-op Assessment: Report given to PACU RN and Post -op Vital signs reviewed and stable  Post vital signs: Reviewed and stable  Complications: No apparent anesthesia complications

## 2012-08-20 NOTE — Anesthesia Preprocedure Evaluation (Addendum)
Anesthesia Evaluation  Patient identified by MRN, date of birth, ID band Patient awake    Reviewed: Allergy & Precautions, H&P , Patient's Chart, lab work & pertinent test results, reviewed documented beta blocker date and time   History of Anesthesia Complications Negative for: history of anesthetic complications  Airway Mallampati: II TM Distance: >3 FB Neck ROM: full    Dental no notable dental hx. (+) Dental Advisory Given, Teeth Intact and Edentulous Upper   Pulmonary neg pulmonary ROS, asthma ,  breath sounds clear to auscultation        Cardiovascular Exercise Tolerance: Good negative cardio ROS  Rhythm:regular Rate:Normal     Neuro/Psych  Headaches,     *RADIOLOGY REPORT*   Clinical Data:  Lumbosacral spondylosis without myelopathy.  Right- sided low back pain extending into the right lower extremity laterally.       Neuromuscular disease negative neurological ROS  negative psych ROS   GI/Hepatic Neg liver ROS, PUD,   Endo/Other  negative endocrine ROSHypothyroidism   Renal/GU negative Renal ROS  negative genitourinary   Musculoskeletal negative musculoskeletal ROS (+)   Abdominal   Peds  Hematology negative hematology ROS (+)   Anesthesia Other Findings Allergy     Inflammatory bowel disease (ulcerative colitis)        Breast cancer     Back pain        Asthma   dx yrs ago but no problems in > 15 yrs Back pain   pinched nerve    Multiple allergies   takes Zyrtec nightly Chronic constipation        Diverticulitis     History of blood transfusion   no abnormal reaction noted    Depression   takes Wellbutrin Hypothyroidism   Graves Disease;takes Synthroid daily    Anxiety   takes Xanax prn History of shingles    Reproductive/Obstetrics negative OB ROS                         Anesthesia Physical Anesthesia Plan  ASA: II  Anesthesia Plan: General LMA and General   Post-op  Pain Management:    Induction: Intravenous  Airway Management Planned: Oral ETT  Additional Equipment:   Intra-op Plan:   Post-operative Plan:   Informed Consent: I have reviewed the patients History and Physical, chart, labs and discussed the procedure including the risks, benefits and alternatives for the proposed anesthesia with the patient or authorized representative who has indicated his/her understanding and acceptance.   Dental Advisory Given and Dental advisory given  Plan Discussed with: CRNA, Surgeon and Anesthesiologist  Anesthesia Plan Comments:        Anesthesia Quick Evaluation

## 2012-08-20 NOTE — Interval H&P Note (Signed)
History and Physical Interval Note:  08/20/2012 6:36 AM  [name] [name]  has presented today for surgery, with the diagnosis of cancer of upper outer quadrant  The goals and the  various methods of treatment have been discussed with the patient and family. After consideration of risks, benefits and other options for treatment, the patient has consented to  Procedure(s) with comments: SIMPLE MASTECTOMY WITH AXILLARY SENTINEL NODE BIOPSY (Left) - bilateral total mastectomy left axillary sentinel node biospy TOTAL MASTECTOMY (Right) TISSUE EXPANDER (Bilateral) - Bilateral Tissue Expanders with Possible HD Flex as a surgical intervention .  The patient's history has been reviewed, patient examined today, no change in status, stable for surgery.  I have reviewed the patient's chart and labs.  Questions were answered to the patient's satisfaction.     Angelia Mould. Derrell Lolling, M.D., [facility] General and Minimally invasive Surgery Breast and Colorectal Surgery Office:   [phone] Pager:   [phone]

## 2012-08-20 NOTE — Anesthesia Procedure Notes (Signed)
Procedure Name: Intubation Date/Time: 08/20/2012 7:37 AM Performed by: Tyrone Nine Pre-anesthesia Checklist: Patient identified, Timeout performed, Emergency Drugs available, Suction available and Patient being monitored Patient Re-evaluated:Patient Re-evaluated prior to inductionOxygen Delivery Method: Circle system utilized Preoxygenation: Pre-oxygenation with 100% oxygen Intubation Type: IV induction Ventilation: Mask ventilation without difficulty Laryngoscope Size: Mac and 3 Grade View: Grade II Tube type: Oral Tube size: 7.0 mm Number of attempts: 1 Airway Equipment and Method: Stylet Placement Confirmation: ETT inserted through vocal cords under direct vision,  positive ETCO2,  CO2 detector and breath sounds checked- equal and bilateral Secured at: 21 cm Tube secured with: Tape Dental Injury: Teeth and Oropharynx as per pre-operative assessment

## 2012-08-21 ENCOUNTER — Other Ambulatory Visit: Payer: Self-pay

## 2012-08-21 MED ORDER — DOCUSATE SODIUM 100 MG PO CAPS
100.0000 mg | ORAL_CAPSULE | Freq: Two times a day (BID) | ORAL | Status: DC
  Administered 2012-08-21 – 2012-08-25 (×8): 100 mg via ORAL
  Filled 2012-08-21 (×8): qty 1

## 2012-08-21 MED ORDER — ENOXAPARIN SODIUM 40 MG/0.4ML ~~LOC~~ SOLN
40.0000 mg | SUBCUTANEOUS | Status: DC
  Administered 2012-08-21 – 2012-08-25 (×5): 40 mg via SUBCUTANEOUS
  Filled 2012-08-21 (×5): qty 0.4

## 2012-08-21 MED ORDER — ACETAMINOPHEN 325 MG PO TABS
650.0000 mg | ORAL_TABLET | ORAL | Status: DC | PRN
  Administered 2012-08-21 – 2012-08-22 (×3): 650 mg via ORAL
  Filled 2012-08-21 (×3): qty 2

## 2012-08-21 MED ORDER — DIPHENHYDRAMINE HCL 25 MG PO CAPS
50.0000 mg | ORAL_CAPSULE | Freq: Four times a day (QID) | ORAL | Status: DC | PRN
  Administered 2012-08-21 – 2012-08-22 (×3): 50 mg via ORAL
  Filled 2012-08-21 (×3): qty 2

## 2012-08-21 NOTE — Progress Notes (Signed)
Subjective: Sore. IV Dilaudid did not work well last night (she refused PCA). PO dilaudid may do better for her.  Objective: Vital signs in last 24 hours: Temp:  [97.1 F (36.2 C)-100.1 F (37.8 C)] 100.1 F (37.8 C) (02/15 0800) Pulse Rate:  [83-128] 128 (02/15 0800) Resp:  [10-24] 24 (02/15 0800) BP: (85-115)/(51-65) 85/55 mmHg (02/15 0800) SpO2:  [91 %-100 %] 91 % (02/15 0800) Weight:  [150 lb 3.2 oz (68.13 kg)] 150 lb 3.2 oz (68.13 kg) (02/14 1537)  Intake/Output from previous day: 02/14 0701 - 02/15 0700 In: 3570 [P.O.:120; I.V.:3450] Out: 3218 [Urine:2755; Drains:213; Blood:250] Intake/Output this shift: Total I/O In: 120 [P.O.:120] Out: -   Operative sites: Mastectomy flaps viable. Tissue expanders appear to be in good position. Drains functioning. Drainage thin. No evidence of bleeding or infection either side.  No results found for this basename: WBC, HGB, HCT, PLATELETS, NA, K, CL, CO2, BUN, CREATININE, GLU,  in the last 72 hours  Studies/Results: Nm Sentinel Node Inj-no Rpt (breast)  08/20/2012  CLINICAL DATA: Cancer left breast   Sulfur colloid was injected intradermally by the nuclear medicine  technologist for breast cancer sentinel node localization.      Assessment/Plan: Ambulate. D/C IV dilaudid. KVO IV. DVT prophylaxis with Lovenox. I advised patient of increased risk of post-op bleeding but explained rationale for DVT prophylaxis.    LOS: 1 day    Etter Sjogren M 08/21/2012 11:55 AM

## 2012-08-21 NOTE — Progress Notes (Signed)
Pt's temp this evening is 102,Heart rate 127.  Pt's lung sounds clear,incisions,clean,dry,intact. Encouraged pt to perform incentive spirometry.  Notified Dr. Magnus Ivan.  Per Dr. Magnus Ivan may give additional 650mg  tylenol now and encourage pt to use incentive spirometer.

## 2012-08-21 NOTE — Progress Notes (Signed)
1 Day Post-Op  Subjective: POD#1 Complains of pain, low grade temp  Objective: Vital signs in last 24 hours: Temp:  [97.1 F (36.2 C)-100.1 F (37.8 C)] 100.1 F (37.8 C) (02/15 0800) Pulse Rate:  [83-128] 128 (02/15 0800) Resp:  [10-24] 24 (02/15 0800) BP: (85-115)/(51-65) 85/55 mmHg (02/15 0800) SpO2:  [91 %-100 %] 91 % (02/15 0800) Weight:  [150 lb 3.2 oz (68.13 kg)] 150 lb 3.2 oz (68.13 kg) (02/14 1537) Last BM Date: 08/20/12  Intake/Output from previous day: 02/14 0701 - 02/15 0700 In: 3570 [P.O.:120; I.V.:3450] Out: 3218 [Urine:2755; Drains:213; Blood:250] Intake/Output this shift: Total I/O In: 120 [P.O.:120] Out: -   Lungs clear Drains serosang Binder in place  Lab Results:  No results found for this basename: WBC, HGB, HCT, PLT,  in the last 72 hours BMET No results found for this basename: NA, K, CL, CO2, GLUCOSE, BUN, CREATININE, CALCIUM,  in the last 72 hours PT/INR No results found for this basename: LABPROT, INR,  in the last 72 hours ABG No results found for this basename: PHART, PCO2, PO2, HCO3,  in the last 72 hours  Studies/Results: Nm Sentinel Node Inj-no Rpt (breast)  08/20/2012  CLINICAL DATA: Cancer left breast   Sulfur colloid was injected intradermally by the nuclear medicine  technologist for breast cancer sentinel node localization.      Anti-infectives: Anti-infectives   Start     Dose/Rate Route Frequency Ordered Stop   08/20/12 2000  ceFAZolin (ANCEF) IVPB 1 g/50 mL premix     1 g 100 mL/hr over 30 Minutes Intravenous 3 times per day 08/20/12 1545     08/20/12 1148  polymyxin B 500,000 Units, bacitracin 50,000 Units in sodium chloride irrigation 0.9 % 500 mL irrigation  Status:  Discontinued       As needed 08/20/12 1148 08/20/12 1245   08/20/12 0744  polymyxin B 500,000 Units, bacitracin 50,000 Units in sodium chloride irrigation 0.9 % 500 mL irrigation  Status:  Discontinued       As needed 08/20/12 0744 08/20/12 1245   08/20/12  0600  ceFAZolin (ANCEF) IVPB 2 g/50 mL premix     2 g 100 mL/hr over 30 Minutes Intravenous On call to O.R. 08/19/12 1231 08/20/12 1225      Assessment/Plan: s/p Procedure(s) with comments: SIMPLE MASTECTOMY WITH AXILLARY SENTINEL NODE BIOPSY (Left) TOTAL MASTECTOMY (Right) TISSUE EXPANDER (Bilateral) - Bilateral Tissue Expanders with Possible HD Flex  Plans per Dr. Odis Luster Incentive spirometer  LOS: 1 day    Brynne Doane A 08/21/2012

## 2012-08-21 NOTE — Progress Notes (Signed)
At 0800 pt is febrile, face flushed.  Discussed IS and to use q1hour.  Pt st she used IS all night long, enc. To continue and that we will increase activity this am and I have d'cd foley left in by OR.

## 2012-08-22 ENCOUNTER — Observation Stay (HOSPITAL_COMMUNITY): Payer: No Typology Code available for payment source

## 2012-08-22 ENCOUNTER — Other Ambulatory Visit: Payer: Self-pay

## 2012-08-22 LAB — CBC WITH DIFFERENTIAL/PLATELET
Basophils Relative: 0 % (ref 0–1)
Eosinophils Absolute: 0 10*3/uL (ref 0.0–0.7)
MCH: 29.2 pg (ref 26.0–34.0)
MCHC: 33.4 g/dL (ref 30.0–36.0)
Monocytes Relative: 8 % (ref 3–12)
Neutrophils Relative %: 84 % — ABNORMAL HIGH (ref 43–77)
Platelets: 178 10*3/uL (ref 150–400)

## 2012-08-22 LAB — URINALYSIS, ROUTINE W REFLEX MICROSCOPIC
Hgb urine dipstick: NEGATIVE
Protein, ur: NEGATIVE mg/dL
Urobilinogen, UA: 0.2 mg/dL (ref 0.0–1.0)

## 2012-08-22 MED ORDER — CEPHALEXIN 500 MG PO CAPS
500.0000 mg | ORAL_CAPSULE | Freq: Two times a day (BID) | ORAL | Status: DC
  Administered 2012-08-22 (×2): 500 mg via ORAL
  Filled 2012-08-22 (×4): qty 1

## 2012-08-22 MED ORDER — OXYCODONE HCL 5 MG PO TABS
10.0000 mg | ORAL_TABLET | ORAL | Status: DC | PRN
  Administered 2012-08-22 – 2012-08-25 (×11): 10 mg via ORAL
  Filled 2012-08-22 (×12): qty 2

## 2012-08-22 MED ORDER — PROMETHAZINE HCL 25 MG PO TABS: 25.0000 mg | ORAL_TABLET | Freq: Four times a day (QID) | ORAL | Status: DC | PRN

## 2012-08-22 NOTE — Progress Notes (Signed)
Subjective: Complains of itching. Pain under good control now. Very sleepy on Benadryl.  Objective: Vital signs in last 24 hours: Temp:  [98.3 F (36.8 C)-102 F (38.9 C)] 98.8 F (37.1 C) (02/16 1014) Pulse Rate:  [119-127] 119 (02/16 1014) Resp:  [18-24] 19 (02/16 1014) BP: (93-99)/(53-59) 98/59 mmHg (02/16 1014) SpO2:  [92 %-100 %] 92 % (02/16 1014)  Intake/Output from previous day: 02/15 0701 - 02/16 0700 In: 1445 [P.O.:900; I.V.:500] Out: 1195 [Urine:1150; Drains:45] Intake/Output this shift: Total I/O In: 200 [P.O.:200] Out: 255 [Urine:200; Drains:55]  Operative sites: Mastectomy flaps viable. Possibly one small area of epidermolysis very lateral on right. Hopefully will not be a major healing problem. Tissue expanders appear in good position. Drains functioning. Drainage thin. No evidence of bleeding or infection.   Recent Labs  08/22/12 1012  WBC 15.3*  HGB 11.6*  HCT 34.7*    Studies/Results: Dg Chest Port 1 View  08/22/2012  *RADIOLOGY REPORT*  Clinical Data: Fever, cough.  PORTABLE CHEST - 1 VIEW  Comparison: 02/24/2012  Findings: Postoperative changes within the chest wall.  Tissue expanders and surgical drains in place.  The heart is normal size. Bibasilar airspace opacities, likely atelectasis.  Suspect small bilateral effusions. No acute bony abnormality.  IMPRESSION: Bibasilar opacities, presumably atelectasis with small bilateral effusions.   Original Report Authenticated By: Charlett Nose, M.D.     Assessment/Plan: Patient is febrile. Temp 102. This either represents severe atelectasis or incipient pneumonia. Confirmed on chest x-ray. It does not appear that she should be discharged today due to high fever. She needs to ambulate, use spirometer and be more tolerant of itching. The benadryl is making her too sleepy. Will try oxycodone instead of dilaudid in effort to decrease pruritis.   LOS: 2 days    Etter Sjogren M 08/22/2012 12:20 PM

## 2012-08-22 NOTE — Progress Notes (Signed)
[facility] Progress Note 2 Days Post-Op  Subjective: No acute distress, but she is lying in bed not wanting to move very much. Has a rattling, feeble cough.  Objective: Vital signs in last 24 hours: Temp:  [98.3 F (36.8 C)-102 F (38.9 C)] 100 F (37.8 C) (02/16 0618) Pulse Rate:  [119-127] 121 (02/16 0618) Resp:  [18-24] 18 (02/16 0618) BP: (93-99)/(53-57) 95/57 mmHg (02/16 0618) SpO2:  [92 %-100 %] 92 % (02/16 0618) Last BM Date: 08/20/12  Intake/Output from previous day: 02/15 0701 - 02/16 0700 In: 1445 [P.O.:900; I.V.:500] Out: 1195 [Urine:1150; Drains:45] Intake/Output this shift:    General: No acute distress, but not very energetic and wanting to move.  Lungs: Decreased breath sounds on the left side.  Oxygen saturations only 92% on RA.  She is a non-smoker.  Chest Wall:  Drains in place draining clear serous fluid mostly.  Most of her pain is on the left side.  I did not remove her dressing to visualize the wound.  Will leave this up to Dr. Odis Luster.  Abd: Benign, but not much of an appetite.  Extremities: No DVT signs or symptoms.  Neuro: Intact  Lab Results:  @LABLAST2 (wbc:2,hgb:2,hct:2,plt:2) BMET No results found for this basename: NA, K, CL, CO2, GLUCOSE, BUN, CREATININE, CALCIUM,  in the last 72 hours PT/INR No results found for this basename: LABPROT, INR,  in the last 72 hours ABG No results found for this basename: PHART, PCO2, PO2, HCO3,  in the last 72 hours  Studies/Results: No results found.  Anti-infectives: Anti-infectives   Start     Dose/Rate Route Frequency Ordered Stop   08/20/12 2000  ceFAZolin (ANCEF) IVPB 1 g/50 mL premix     1 g 100 mL/hr over 30 Minutes Intravenous 3 times per day 08/20/12 1545     08/20/12 1148  polymyxin B 500,000 Units, bacitracin 50,000 Units in sodium chloride irrigation 0.9 % 500 mL irrigation  Status:  Discontinued       As needed 08/20/12 1148 08/20/12 1245   08/20/12 0744  polymyxin B 500,000 Units,  bacitracin 50,000 Units in sodium chloride irrigation 0.9 % 500 mL irrigation  Status:  Discontinued       As needed 08/20/12 0744 08/20/12 1245   08/20/12 0600  ceFAZolin (ANCEF) IVPB 2 g/50 mL premix     2 g 100 mL/hr over 30 Minutes Intravenous On call to O.R. 08/19/12 1231 08/20/12 1225      Assessment/Plan: s/p Procedure(s): SIMPLE MASTECTOMY WITH AXILLARY SENTINEL NODE BIOPSY TOTAL MASTECTOMY TISSUE EXPANDER Check CXR Check UA with micro and urine C&S Ambulate.   Continue antibiotics for now.  LOS: 2 days   Marta Lamas. Gae Bon, MD, FACS [phone] [facility] 08/22/2012

## 2012-08-22 NOTE — Progress Notes (Signed)
Breast bag given to patient and explained to pt and daughter.  JP measuring container given and explained and daughter understands how to empty the JP drains and how to record drainage.  Reviewed arm precautions with pt and daughter.

## 2012-08-22 NOTE — Progress Notes (Signed)
Utilization review completed.  

## 2012-08-23 ENCOUNTER — Other Ambulatory Visit: Payer: Self-pay

## 2012-08-23 ENCOUNTER — Observation Stay (HOSPITAL_COMMUNITY): Payer: No Typology Code available for payment source

## 2012-08-23 ENCOUNTER — Encounter (HOSPITAL_COMMUNITY): Payer: Self-pay | Admitting: General Surgery

## 2012-08-23 DIAGNOSIS — R920 Mammographic microcalcification found on diagnostic imaging of breast: Secondary | ICD-10-CM

## 2012-08-23 DIAGNOSIS — D0590 Unspecified type of carcinoma in situ of unspecified breast: Secondary | ICD-10-CM

## 2012-08-23 DIAGNOSIS — C50219 Malignant neoplasm of upper-inner quadrant of unspecified female breast: Secondary | ICD-10-CM

## 2012-08-23 DIAGNOSIS — J189 Pneumonia, unspecified organism: Secondary | ICD-10-CM

## 2012-08-23 DIAGNOSIS — R059 Cough, unspecified: Secondary | ICD-10-CM

## 2012-08-23 DIAGNOSIS — F329 Major depressive disorder, single episode, unspecified: Secondary | ICD-10-CM

## 2012-08-23 DIAGNOSIS — R05 Cough: Secondary | ICD-10-CM

## 2012-08-23 DIAGNOSIS — R509 Fever, unspecified: Secondary | ICD-10-CM

## 2012-08-23 DIAGNOSIS — F3289 Other specified depressive episodes: Secondary | ICD-10-CM

## 2012-08-23 DIAGNOSIS — R000 Tachycardia, unspecified: Secondary | ICD-10-CM

## 2012-08-23 DIAGNOSIS — E039 Hypothyroidism, unspecified: Secondary | ICD-10-CM

## 2012-08-23 LAB — URINE CULTURE: Culture: NO GROWTH

## 2012-08-23 LAB — BASIC METABOLIC PANEL
CO2: 28 mEq/L (ref 19–32)
Chloride: 95 mEq/L — ABNORMAL LOW (ref 96–112)
Glucose, Bld: 102 mg/dL — ABNORMAL HIGH (ref 70–99)
Potassium: 3.3 mEq/L — ABNORMAL LOW (ref 3.5–5.1)
Sodium: 134 mEq/L — ABNORMAL LOW (ref 135–145)

## 2012-08-23 LAB — MAGNESIUM: Magnesium: 2 mg/dL (ref 1.5–2.5)

## 2012-08-23 LAB — CBC
Hemoglobin: 11.2 g/dL — ABNORMAL LOW (ref 12.0–15.0)
MCH: 29.2 pg (ref 26.0–34.0)
RBC: 3.83 MIL/uL — ABNORMAL LOW (ref 3.87–5.11)
WBC: 13.7 10*3/uL — ABNORMAL HIGH (ref 4.0–10.5)

## 2012-08-23 MED ORDER — SODIUM CHLORIDE 0.9 % IV SOLN
INTRAVENOUS | Status: DC
  Administered 2012-08-23 – 2012-08-24 (×3): via INTRAVENOUS

## 2012-08-23 MED ORDER — LEVALBUTEROL TARTRATE 45 MCG/ACT IN AERO
2.0000 | INHALATION_SPRAY | Freq: Four times a day (QID) | RESPIRATORY_TRACT | Status: DC
  Filled 2012-08-23: qty 15

## 2012-08-23 MED ORDER — PIPERACILLIN-TAZOBACTAM 3.375 G IVPB
3.3750 g | Freq: Three times a day (TID) | INTRAVENOUS | Status: DC
  Administered 2012-08-23 – 2012-08-25 (×6): 3.375 g via INTRAVENOUS
  Filled 2012-08-23 (×8): qty 50

## 2012-08-23 MED ORDER — POTASSIUM CHLORIDE CRYS ER 20 MEQ PO TBCR
40.0000 meq | EXTENDED_RELEASE_TABLET | ORAL | Status: AC
  Administered 2012-08-23 (×2): 40 meq via ORAL
  Filled 2012-08-23 (×3): qty 2

## 2012-08-23 MED ORDER — SODIUM CHLORIDE 0.9 % IV SOLN
750.0000 mg | Freq: Two times a day (BID) | INTRAVENOUS | Status: DC
  Administered 2012-08-23 – 2012-08-24 (×4): 750 mg via INTRAVENOUS
  Filled 2012-08-23 (×6): qty 750

## 2012-08-23 MED ORDER — POLYETHYLENE GLYCOL 3350 17 G PO PACK
17.0000 g | PACK | Freq: Every day | ORAL | Status: DC
  Administered 2012-08-23 – 2012-08-25 (×3): 17 g via ORAL
  Filled 2012-08-23 (×3): qty 1

## 2012-08-23 MED ORDER — PIPERACILLIN-TAZOBACTAM 3.375 G IVPB 30 MIN
3.3750 g | Freq: Once | INTRAVENOUS | Status: AC
  Administered 2012-08-23: 3.375 g via INTRAVENOUS
  Filled 2012-08-23: qty 50

## 2012-08-23 MED ORDER — LEVALBUTEROL TARTRATE 45 MCG/ACT IN AERO
2.0000 | INHALATION_SPRAY | Freq: Four times a day (QID) | RESPIRATORY_TRACT | Status: DC
  Administered 2012-08-23 – 2012-08-25 (×8): 2 via RESPIRATORY_TRACT
  Filled 2012-08-23: qty 15

## 2012-08-23 NOTE — Op Note (Signed)
NAMELIRA, STEPHEN             ACCOUNT NO.:  [id number]  MEDICAL RECORD NO.:  [id number]  LOCATION:                                 FACILITY:  PHYSICIAN:  Etter Sjogren, M.D.     DATE OF BIRTH:  Sep 23, 1960  DATE OF PROCEDURE:  08/20/2012 DATE OF DISCHARGE:                              OPERATIVE REPORT   PREOPERATIVE DIAGNOSIS:  Breast cancer.  POSTOPERATIVE DIAGNOSIS:  Breast cancer.  PROCEDURES: 1. Bilateral breast reconstruction with tissue expander. 2. Complex wound closure of left chest 2.5 cm.  SURGEON:  Etter Sjogren, M.D.  ANESTHESIA:  General.  ESTIMATED BLOOD LOSS:  30 mL.  DRAINS:  Two 19-French drains on each side.  CLINICAL NOTE:  A 52 year old woman has breast cancer and is having bilateral mastectomies.  Options for reconstruction were discussed and she elected to have placement of tissue expanders as a planned staged procedure for eventual removal of expanders and placement of implants. The nature of procedure and risks plus complications were discussed with her in detail.  These risks include, but were not limited to, bleeding, infection, healing problems, scarring, loss of sensation, fluid accumulations, anesthesia complications, failure of device, capsular contracture, displacement device, wrinkles, ripples, pneumothorax, pulmonary embolism, asymmetry, chronic pain, disappointment, loss of range of motion or strength, short arms and shoulders, and contour deformities especially at the periphery of the breast reconstructions in the axillary region and lateral chest.  She understood all of these and wished to proceed.  DESCRIPTION OF PROCEDURE:  The patient was in the operating room, bilateral mastectomies completed.  The wounds were inspected and thorough irrigation with saline.  The redundant skin was then trimmed both to remove redundant skin as well as to check skin flaps viability. It was bright red bleeding from the skin flaps consistent  viability. Skin on the left side which to the cancer site was sent as a specimen. The dissection carried deep to the pectoralis major muscles as well as the serratus anterior laterally for a short distance.  The submuscular space was created to the dimensions of the planned expander and then thorough irrigation with saline as well as antibiotic solution. Meticulous hemostasis with electrocautery.  After thoroughly cleaning gloves, the implants were prepared.  These were Mentor expanders 650 mL medium height, 100 mL sterile saline placed using a closed filling system and the expanders were placed in antibiotic solution, and soaked, therefore greater than 5 minutes.  The spaces were inspected and hemostasis was confirmed.  The expanders were positioned and care was taken to make sure they were oriented properly.  The muscle closure after again placing antibiotic solution on the expanders 3-0 Vicryl interrupted figure-of-eight sutures.  The filler process was then continued to 400 mL of saline on the left side and 350 mL on the right side using a closed filling system with sterile saline.  Less was placed on the right than the left because the skin flaps were thicker and there was more subcutaneous tissue on the right as compared with the left. Again, the mastectomy spaces were checked and hemostasis confirmed.  The two 19-French drains positioned, brought through separate stab wounds inferolaterally and great care was taken to  make sure that the subcutaneous tunnels were around 8-10 cm in length.  These drains were secured with 3-0 Prolene sutures.  The skin closure 3-0 Monocryl inverted deep dermal sutures and running 3-0 Monocryl subcuticular suture.  In addition, there was a wound in the upper outer quadrant on the left side.  This wound was excised, debrided, and closed with 3-0 Prolene simple interrupted sutures.  Again, the skin edges did bleed bright red blood, consistent with  viability around this area once that had been debrided.  Dermabond was applied to the wounds and closures and Biopatch placed around the drains with Tegaderm and she was transported to the recovery room stable after having had a dry dressing and a chest binder placed around her.  She tolerated it well, and she will be observed overnight.     Etter Sjogren, M.D.     DB/MEDQ  D:  08/20/2012  T:  08/20/2012  Job:  [id number]
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
## 2012-08-23 NOTE — Progress Notes (Signed)
3 Days Post-Op  Subjective: alert. Sitting in chair. Tolerating diet. Constipated. Ambulating in halls.Temp 100.4. Heart rate 122, regular.  She states she feels better and is more mobile. Denies shortness of breath. Trying to cough. Cough nonproductive. Does not feel any wheezing or symptoms of asthma.  Chest x-ray yesterday suggested by basilar atelectasis versus early pneumonia. She is on Keflex. WBC 15,300 yesterday. Urinalysis clear. Urine culture pending. On Keflex. IV fluids started.  Objective: Vital signs in last 24 hours: Temp:  [97.2 F (36.2 C)-100.8 F (38.2 C)] 97.2 F (36.2 C) (02/17 0500) Pulse Rate:  [119-131] 122 (02/17 0500) Resp:  [16-19] 18 (02/17 0500) BP: (98-114)/(59-66) 113/62 mmHg (02/17 0500) SpO2:  [92 %-95 %] 92 % (02/17 0500) Last BM Date: 08/20/12  Intake/Output from previous day: 02/16 0701 - 02/17 0700 In: 200 [P.O.:200] Out: 560 [Urine:400; Drains:160] Intake/Output this shift: Total I/O In: -  Out: 35 [Drains:35]    Exam: General appearance: alert. Cooperative. Mental status normal. Doesn't appear to be in any distress. Resp: clear in the upper lung fields, egophony at bases. No wheezing or rhonchi are heard. Breasts:  bilateral breast skin flaps looked good. Pink and viable. Question tiny dark area right lateral skin incision. Left arm reveals no sensory deficit or swelling. Drainage serosanguineous.    Lab Results:  Results for orders placed during the hospital encounter of 08/20/12 (from the past 24 hour(s))  CBC WITH DIFFERENTIAL     Status: Abnormal   Collection Time    08/22/12 10:12 AM      Result Value Range   WBC 15.3 (*) 4.0 - 10.5 K/uL   RBC 3.97  3.87 - 5.11 MIL/uL   Hemoglobin 11.6 (*) 12.0 - 15.0 g/dL   HCT 16.1 (*) 09.6 - 04.5 %   MCV 87.4  78.0 - 100.0 fL   MCH 29.2  26.0 - 34.0 pg   MCHC 33.4  30.0 - 36.0 g/dL   RDW 40.9  81.1 - 91.4 %   Platelets 178  150 - 400 K/uL   Neutrophils Relative 84 (*) 43 - 77 %   Neutro Abs 12.8 (*) 1.7 - 7.7 K/uL   Lymphocytes Relative 8 (*) 12 - 46 %   Lymphs Abs 1.3  0.7 - 4.0 K/uL   Monocytes Relative 8  3 - 12 %   Monocytes Absolute 1.2 (*) 0.1 - 1.0 K/uL   Eosinophils Relative 0  0 - 5 %   Eosinophils Absolute 0.0  0.0 - 0.7 K/uL   Basophils Relative 0  0 - 1 %   Basophils Absolute 0.0  0.0 - 0.1 K/uL  URINALYSIS, ROUTINE W REFLEX MICROSCOPIC     Status: None   Collection Time    08/22/12 11:18 AM      Result Value Range   Color, Urine YELLOW  YELLOW   APPearance CLEAR  CLEAR   Specific Gravity, Urine 1.013  1.005 - 1.030   pH 6.0  5.0 - 8.0   Glucose, UA NEGATIVE  NEGATIVE mg/dL   Hgb urine dipstick NEGATIVE  NEGATIVE   Bilirubin Urine NEGATIVE  NEGATIVE   Ketones, ur NEGATIVE  NEGATIVE mg/dL   Protein, ur NEGATIVE  NEGATIVE mg/dL   Urobilinogen, UA 0.2  0.0 - 1.0 mg/dL   Nitrite NEGATIVE  NEGATIVE   Leukocytes, UA NEGATIVE  NEGATIVE     Studies/Results: @RISRSLT24 @  . buPROPion  150 mg Oral Daily  . cephALEXin  500 mg Oral Q12H  . docusate  sodium  100 mg Oral BID  . enoxaparin (LOVENOX) injection  40 mg Subcutaneous Q24H  . ketorolac  1 drop Right Eye QID  . levalbuterol  2 puff Inhalation Q6H  . levothyroxine  100 mcg Oral QAC breakfast  . methocarbamol  500 mg Oral QID  . polyethylene glycol  17 g Oral Daily     Assessment/Plan: s/p Procedure(s): SIMPLE MASTECTOMY WITH AXILLARY SENTINEL NODE BIOPSY TOTAL MASTECTOMY TISSUE EXPANDER   POD#3.Bilateral total mastectomy, left axillary sentinel node biopsy, bilateral tissue expanders. Surgically wounds looked fine. No evidence of infection.  Fever and tachycardia and leukocytosis. Suspected origin of this is pulmonary. Not exactly clear whether this is a hospital-acquired pneumonia or simply atelectasis superimposed on mild chronic asthma. We'll get 2 - view chest x-ray. EKG Empiric Zosyn IV Resume IV fluids to counteract possible dehydration Xopenex [facility] to  see(d/w Dr. Onalee Hua)  Eather Colas for constipation. Check CBC, bmet and TSH now.  DVT prophylaxis. On Lovenox.  @PROBHOSP @  LOS: 3 days    Nicholson Starace M. Derrell Lolling, M.D., [facility] General and Minimally invasive Surgery Breast and Colorectal Surgery Office:   [phone] Pager:   [phone]  08/23/2012  . .prob

## 2012-08-23 NOTE — Progress Notes (Signed)
Subjective: Slightly better.  Objective: Vital signs in last 24 hours: Temp:  [97.2 F (36.2 C)-100.8 F (38.2 C)] 97.2 F (36.2 C) (02/17 0500) Pulse Rate:  [119-131] 122 (02/17 0500) Resp:  [16-19] 18 (02/17 0500) BP: (98-114)/(59-66) 113/62 mmHg (02/17 0500) SpO2:  [92 %-95 %] 92 % (02/17 0500)  Intake/Output from previous day: 02/16 0701 - 02/17 0700 In: 200 [P.O.:200] Out: 560 [Urine:400; Drains:160] Intake/Output this shift:    Operative sites: Mastectomy flaps unchanged. Drains functioning. Drainage unchanged from previous.   Recent Labs  08/22/12 1012 08/23/12 0652  WBC 15.3* 13.7*  HGB 11.6* 11.2*  HCT 34.7* 33.6*    Studies/Results: Dg Chest Port 1 View  08/22/2012  *RADIOLOGY REPORT*  Clinical Data: Fever, cough.  PORTABLE CHEST - 1 VIEW  Comparison: 02/24/2012  Findings: Postoperative changes within the chest wall.  Tissue expanders and surgical drains in place.  The heart is normal size. Bibasilar airspace opacities, likely atelectasis.  Suspect small bilateral effusions. No acute bony abnormality.  IMPRESSION: Bibasilar opacities, presumably atelectasis with small bilateral effusions.   Original Report Authenticated By: Charlett Nose, M.D.     Assessment/Plan: Hospitalist to see today.   LOS: 3 days    Etter Sjogren M 08/23/2012 7:49 AM

## 2012-08-23 NOTE — Progress Notes (Addendum)
52yo female s/p mastectomy now with fever, tachycardia, and leukocytosis, unclear on CXR whether this is a hospital-acquired pneumonia or simply atelectasis superimposed on mild chronic asthma, to begin Zosyn.  Will start Zosyn 3.375g IV Q8H for CrCl 75 ml/min and monitor.  Vernard Gambles, PharmD, BCPS 08/23/2012 6:46 AM   Addendum:   Adding Vancomycin for HCAP coverage per pharmacy dosing.   WBC up to 15.3, Tmax 100.8. UA negative. Urine culture pending. Blood cultures pending. Keflex discontinued. SCr 0.62/estCrCl~75 mL/min.   Plan: Vancomycin 750mg  IV q12h. Follow-up renal function, cutlures, and clinical status. Vancomycin trough at Css if continued.   Link Snuffer, PharmD, BCPS Clinical Pharmacist [phone] 08/23/2012, 8:26 AM

## 2012-08-24 DIAGNOSIS — F411 Generalized anxiety disorder: Secondary | ICD-10-CM

## 2012-08-24 MED ORDER — POLYETHYLENE GLYCOL 3350 17 G PO PACK
17.0000 g | PACK | Freq: Two times a day (BID) | ORAL | Status: AC
  Administered 2012-08-24: 17 g via ORAL
  Filled 2012-08-24 (×2): qty 1

## 2012-08-24 MED ORDER — ALPRAZOLAM 0.25 MG PO TABS
0.2500 mg | ORAL_TABLET | Freq: Once | ORAL | Status: AC
  Administered 2012-08-24: 0.25 mg via ORAL

## 2012-08-24 NOTE — Progress Notes (Addendum)
4 Days Post-Op  Subjective:  I greatly appreciate the  medical input from Dr. Gwenlyn Perking and the [facility].  Patient has ambulated in the halls numerous times. She is somewhat fatigued but denies shortness of breath or sputum production. She feels inhibited in taking a deep breath because of incisional pain.She's working hard on incentive spirometry. Appetite not great. Passing flatus, no stool.  Now afebrile. Still tachycardic at 120. EKG shows no acute changes. TSH normal. Chest x-ray shows small bilateral pleural effusions, a little bit of basilar atelectasis, but no large consolidated areas. Urine culture shows no growth. Blood cultures pending.     Objective: Vital signs in last 24 hours: Temp:  [97.5 F (36.4 C)-100.3 F (37.9 C)] 98.4 F (36.9 C) (02/18 0138) Pulse Rate:  [120-129] 120 (02/18 0138) Resp:  [18-20] 18 (02/18 0138) BP: (89-110)/(51-71) 91/55 mmHg (02/18 0138) SpO2:  [91 %-98 %] 92 % (02/18 0255) Last BM Date: 08/19/12  Intake/Output from previous day: 02/17 0701 - 02/18 0700 In: 575 [P.O.:300; I.V.:275] Out: 375 [Urine:300; Drains:75] Intake/Output this shift: Total I/O In: 275 [I.V.:275] Out: 335 [Urine:300; Drains:35]  General appearance: alert. Mental status normal. Slightly fatigued. No distress. No increased work of breathing. Does not appear toxic at all. Resp: reasonably clear. Seems to be moving air at bases better. No wheeze or rhonchi. Breasts: normal appearance, no masses or tenderness, skin flaps pink and viable. Perhaps trace epidermal lysis lateral right incision. JPs all functioning, serous drainage, declining. The wounds did not look infected.  Lab Results:  Results for orders placed during the hospital encounter of 08/20/12 (from the past 24 hour(s))  CBC     Status: Abnormal   Collection Time    08/23/12  6:52 AM      Result Value Range   WBC 13.7 (*) 4.0 - 10.5 K/uL   RBC 3.83 (*) 3.87 - 5.11 MIL/uL   Hemoglobin 11.2 (*)  12.0 - 15.0 g/dL   HCT 16.1 (*) 09.6 - 04.5 %   MCV 87.7  78.0 - 100.0 fL   MCH 29.2  26.0 - 34.0 pg   MCHC 33.3  30.0 - 36.0 g/dL   RDW 40.9  81.1 - 91.4 %   Platelets 194  150 - 400 K/uL  BASIC METABOLIC PANEL     Status: Abnormal   Collection Time    08/23/12  6:52 AM      Result Value Range   Sodium 134 (*) 135 - 145 mEq/L   Potassium 3.3 (*) 3.5 - 5.1 mEq/L   Chloride 95 (*) 96 - 112 mEq/L   CO2 28  19 - 32 mEq/L   Glucose, Bld 102 (*) 70 - 99 mg/dL   BUN 9  6 - 23 mg/dL   Creatinine, Ser 7.82  0.50 - 1.10 mg/dL   Calcium 8.7  8.4 - 95.6 mg/dL   GFR calc non Af Amer >90  >90 mL/min   GFR calc Af Amer >90  >90 mL/min  TSH     Status: None   Collection Time    08/23/12  6:52 AM      Result Value Range   TSH 0.368  0.350 - 4.500 uIU/mL  MAGNESIUM     Status: None   Collection Time    08/23/12  9:03 AM      Result Value Range   Magnesium 2.0  1.5 - 2.5 mg/dL     Studies/Results: @RISRSLT24 @  . buPROPion  150 mg Oral Daily  .  docusate sodium  100 mg Oral BID  . enoxaparin (LOVENOX) injection  40 mg Subcutaneous Q24H  . ketorolac  1 drop Right Eye QID  . levalbuterol  2 puff Inhalation Q6H  . levothyroxine  100 mcg Oral QAC breakfast  . methocarbamol  500 mg Oral QID  . piperacillin-tazobactam (ZOSYN)  IV  3.375 g Intravenous Q8H  . polyethylene glycol  17 g Oral Daily  . vancomycin  750 mg Intravenous Q12H     Assessment/Plan: s/p Procedure(s): SIMPLE MASTECTOMY WITH AXILLARY SENTINEL NODE BIOPSY TOTAL MASTECTOMY TISSUE EXPANDER  POD#4.Bilateral total mastectomy, left axillary Sentinel node biopsy, bilateral tissue expanders. Surgically wounds looked fine. No evidence of infection. Await pathology report.  Fever and tachycardia and leukocytosis. Suspected origin of this is pulmonary. Not exactly clear whether this is a hospital-acquired pneumonia or simply atelectasis superimposed on mild chronic asthma.    Empiric Zosyn And vancomycin IV  Resume IV  fluids to counteract possible dehydration  Xopenex HHN  Appreciate [facility] input   MiraLAX for constipation.   DVT prophylaxis. On Lovenox.   @PROBHOSP @  LOS: 4 days    Tarus Briski M. Derrell Lolling, M.D., [facility] General and Minimally invasive Surgery Breast and Colorectal Surgery Office:   [phone] Pager:   [phone]  08/24/2012  . .prob

## 2012-08-24 NOTE — Progress Notes (Signed)
Subjective:  Feels much better. Tolerating diet well. Good pain control.  Objective: Vital signs in last 24 hours: Temp:  [97.5 F (36.4 C)-100.3 F (37.9 C)] 97.9 F (36.6 C) (02/18 1007) Pulse Rate:  [103-129] 121 (02/18 1007) Resp:  [18-20] 18 (02/18 1007) BP: (89-110)/(51-70) 102/63 mmHg (02/18 1007) SpO2:  [91 %-98 %] 98 % (02/18 1007)  Intake/Output from previous day: 02/17 0701 - 02/18 0700 In: 1370 [P.O.:300; I.V.:870; IV Piggyback:200] Out: 400 [Urine:300; Drains:100] Intake/Output this shift: Total I/O In: 700 [P.O.:700] Out: 31 [Drains:31]  Operative sites: Mastectomy flaps viable. No evidence of healing problem. Tissue expanders appear to be OK. Drains functioning. Drainage clear.   Recent Labs  08/22/12 1012 08/23/12 0652  WBC 15.3* 13.7*  HGB 11.6* 11.2*  HCT 34.7* 33.6*  NA  --  134*  K  --  3.3*  CL  --  95*  CO2  --  28  BUN  --  9  CREATININE  --  0.62    Studies/Results: Dg Chest 2 View  08/23/2012  *RADIOLOGY REPORT*  Clinical Data: Fever.  History breast cancer status post bilateral mastectomy.  CHEST - 2 VIEW  Comparison: Chest x-ray 08/22/2012.  Findings: Postoperative changes of bilateral mastectomy are noted with bilateral tissue expanders and soft tissue drains in place. Subcutaneous emphysema is noted in the chest wall bilaterally. Surgical clips are present in the right axilla, suggesting prior nodal dissection.  Lung volumes are normal.  There are some bibasilar opacities favored to reflect subsegmental atelectasis with superimposed small bilateral pleural effusions (left greater than right).  No definite consolidative airspace disease. Pulmonary vasculature is normal.  Heart size and mediastinal contours are within normal limits.  IMPRESSION: 1.  Small bilateral pleural effusions with probable bibasilar subsegmental atelectasis. 2.  Postoperative changes, as above.   Original Report Authenticated By: Trudie Reed, [name].D.      Assessment/Plan: Await medical clearance for discharge.   LOS: 4 days    [name] [name] 08/24/2012 1:08 PM

## 2012-08-24 NOTE — Consult Note (Signed)
[facility] Medical Consultation  [name] [name] ZOX:[id number] DOB: 1960/07/25 DOA: 08/20/2012 PCP: [name] Rhymes, MD   Requesting physician: Dr. Derrell Lolling Date of consultation: 2/17 Reason for consultation: fever  Impression/Recommendations 1. Fever: appears to be secondary to probable PNA based on clinical grounds and less on the CXR.  - blood cultures were sent 08/23/2012 and patient was started on vancomycin and zosyn. If blood cultures remain negative at 48 hours and patient is afebrile would favor discontinuing iv antibiotics and switching to po Levofloxacin to complete a 7 day course (5 more days) - wean off O2 as tolerated and encouraged incentive spirometry. Patient is now on room air and appears comfortable.   2. Tachycardia: secondary to ongoing infection, fever and pain.  - she is on an adequate pain regimen, encouraged patient to ask for her prn medications.  - TSH is normal - continuing IVF  3. Anxiety: patient seems to have a degree of anxiety since her diagnosis. At home, she used to use Xanax daily at night time. Her last dose was 2/15 and this might contribute to her tachycardia. Will give Xanax 0.25 one time dose now and continue her on 0.25 TID PRN. - patient counseled to ask for this medication   4. bilateral mastectomy: post operative treatment per surgery/primary team. Outpatient follow up with oncology service.  5. Leukocytosis:due to ongoing infection; vs demargination due to stress from surgery. - Start IVF's - CBC in the morning.   WJX:[id number]  I will followup again tomorrow morning. Please contact me if I can be of assistance in the meanwhile. Thank you for this consultation.  Chief Complaint: fever, cough, tachycardia  HPI:  52 y/o woman with pmh of hypothyroidism, remote asthma (not on any medications), allergic rhinitis, depression and breast cancer; admitted for bilateral mastectomy (due to breast cancer); doing fine from her surgery and  recovering well; but with ongoing fever, cough and mild SOB. CXR with acute findings of atelectasis vs PNA. Elevated WBC's and tachycardia also appreciated. [facility] called to assist with management of fever and PNA.  Physical Exam: Blood pressure 102/63, pulse 121, temperature 97.9 F (36.6 C), temperature source Oral, resp. rate 18, height 5\' 3"  (1.6 m), weight 68.13 kg (150 lb 3.2 oz), SpO2 98.00%. Filed Vitals:   08/24/12 0626 08/24/12 0629 08/24/12 0939 08/24/12 1007  BP: 103/70 103/70  102/63  Pulse: 111 103 118 121  Temp: 97.9 F (36.6 C) 97.9 F (36.6 C)  97.9 F (36.6 C)  TempSrc: Oral Oral  Oral  Resp: 18 18 20 18   Height:      Weight:      SpO2: 95% 95% 93% 98%     General:  Mildly anxious but otherwise in no distress.   Eyes: no icterus, no nystagmus, PERRLA, EOMI  Neck: supple, no bruits or thyromegaly  Cardiovascular: tachycardia, no rubs or gallops  Respiratory: minimal rhonchi, no wheezing, good air movemnt overall  Abdomen: soft, NT, ND, positive BS  Skin: no rash or petechiae; patient with clean dressings and drains on her breast area; no bloody drainage and wound clean  Musculoskeletal: no joint swelling or erythema  Psychiatric: mildly anxious  Neurologic: non focal, CN intact, AAOX4; MS 5/5 bilaterally  Labs on Admission:  Basic Metabolic Panel:  Recent Labs Lab 08/23/12 0652 08/23/12 0903  NA 134*  --   K 3.3*  --   CL 95*  --   CO2 28  --   GLUCOSE 102*  --   BUN 9  --  CREATININE 0.62  --   CALCIUM 8.7  --   MG  --  2.0   CBC:  Recent Labs Lab 08/22/12 1012 08/23/12 0652  WBC 15.3* 13.7*  NEUTROABS 12.8*  --   HGB 11.6* 11.2*  HCT 34.7* 33.6*  MCV 87.4 87.7  PLT 178 194   CBG:  Recent Labs Lab 08/20/12 1602  GLUCAP 340*    Radiological Exams on Admission: Dg Chest 2 View  08/23/2012  *RADIOLOGY REPORT*  Clinical Data: Fever.  History breast cancer status post bilateral mastectomy.  CHEST - 2 VIEW  Comparison: Chest  x-ray 08/22/2012.  Findings: Postoperative changes of bilateral mastectomy are noted with bilateral tissue expanders and soft tissue drains in place. Subcutaneous emphysema is noted in the chest wall bilaterally. Surgical clips are present in the right axilla, suggesting prior nodal dissection.  Lung volumes are normal.  There are some bibasilar opacities favored to reflect subsegmental atelectasis with superimposed small bilateral pleural effusions (left greater than right).  No definite consolidative airspace disease. Pulmonary vasculature is normal.  Heart size and mediastinal contours are within normal limits.  IMPRESSION: 1.  Small bilateral pleural effusions with probable bibasilar subsegmental atelectasis. 2.  Postoperative changes, as above.   Original Report Authenticated By: Trudie Reed, M.D.    [name] [facility] Pager [phone]  If 7PM-7AM, please contact night-coverage [email or web address] Password [facility] 08/24/2012, 11:28 AM

## 2012-08-25 DIAGNOSIS — E785 Hyperlipidemia, unspecified: Secondary | ICD-10-CM

## 2012-08-25 LAB — BASIC METABOLIC PANEL
BUN: 4 mg/dL — ABNORMAL LOW (ref 6–23)
Chloride: 100 mEq/L (ref 96–112)
Creatinine, Ser: 0.54 mg/dL (ref 0.50–1.10)
GFR calc Af Amer: >90 mL/min (ref >90)
GFR calc non Af Amer: >90 mL/min (ref >90)

## 2012-08-25 LAB — CBC
HCT: 27.5 % — ABNORMAL LOW (ref 36.0–46.0)
MCHC: 33.1 g/dL (ref 30.0–36.0)
MCV: 87.6 fL (ref 78.0–100.0)
Platelets: 177 10*3/uL (ref 150–400)
RDW: 13.4 % (ref 11.5–15.5)
WBC: 6.6 10*3/uL (ref 4.0–10.5)

## 2012-08-25 MED ORDER — POTASSIUM CHLORIDE CRYS ER 20 MEQ PO TBCR
40.0000 meq | EXTENDED_RELEASE_TABLET | Freq: Once | ORAL | Status: AC
  Administered 2012-08-25: 40 meq via ORAL
  Filled 2012-08-25: qty 2

## 2012-08-25 MED ORDER — ENOXAPARIN SODIUM 40 MG/0.4ML ~~LOC~~ SOLN: 40.0000 mg | SUBCUTANEOUS | Status: DC

## 2012-08-25 MED ORDER — LEVOFLOXACIN 500 MG PO TABS: 500.0000 mg | ORAL_TABLET | Freq: Every day | ORAL | Status: DC

## 2012-08-25 MED ORDER — LEVOFLOXACIN 500 MG PO TABS
500.0000 mg | ORAL_TABLET | Freq: Every day | ORAL | Status: DC
  Administered 2012-08-25: 500 mg via ORAL
  Filled 2012-08-25: qty 1

## 2012-08-25 MED ORDER — METHOCARBAMOL 500 MG PO TABS: 500.0000 mg | ORAL_TABLET | Freq: Four times a day (QID) | ORAL | Status: DC

## 2012-08-25 MED ORDER — BACITRACIN ZINC 500 UNIT/GM EX OINT
TOPICAL_OINTMENT | Freq: Two times a day (BID) | CUTANEOUS | Status: DC
  Filled 2012-08-25: qty 15

## 2012-08-25 MED ORDER — OXYCODONE HCL 10 MG PO TABS: 10.0000 mg | ORAL_TABLET | ORAL | Status: DC | PRN

## 2012-08-25 NOTE — Progress Notes (Signed)
Pt discharged to home

## 2012-08-25 NOTE — Discharge Summary (Signed)
Physician Discharge Summary  Patient ID: [name] [name] MRN: [id number] DOB/AGE: 54-May-1962 54 54 y.o.  Admit date: 08/20/2012 Discharge date: 08/25/2012  Admission Diagnoses:Breast cancer  Discharge Diagnoses: Same with fever and atelectasis and possible pneumonia Active Problems:   * No active hospital problems. *   Discharged Condition: good  Hospital Course: On the day of admission the patient was taken to surgery and had bilateral mastectomy and placement tissue expanders. The patient tolerated the procedures well. Postoperatively, the flap maintained excellent color and capillary refill. The patient was ambulatory and tolerating diet on the first postoperative day. She developed fever with some elevation of WBC. CXR showed atelectasis. No clear evidence of pneumonia. Seen by hospitalist who recommended presumptive treatment for pneumonia. She was given vancomycin and zosyn and levaquin for 5 days was recommended for discharge. At this point she is doing very well.  Significant Diagnostic Studies: labs: WBC 15,000 on POD #2. CXR revealed atelectasis.  Treatments: antibiotics: Ancef,vancomycin, zosyn anticoagulation: LMW heparin and surgery: bilateral mastectomy, sentinel node and reconstruction with tissue expanders  Discharge Exam: Blood pressure 87/58, pulse 100, temperature 98.4 F (36.9 C), temperature source Oral, resp. rate 16, height 5\' 3"  (1.6 m), weight 150 lb 3.2 oz (68.13 kg), SpO2 94.00%.  Operative sites: Mastectomy flaps viable. Tissue expanders in good position. Drains functioning. Drainage thin. No evidence of bleeding or infection.  Disposition: Discharge home. Return to office in 5 days   Future Appointments Provider Department Dept Phone   08/31/2012 10:45 AM Ernestene Mention, MD [facility] Surgery, [place] [phone]   09/13/2012 4:00 PM Lowella Dell, MD [facility] MEDICAL ONCOLOGY [phone]       Medication List    STOP taking these  medications       CENTRUM ULTRA WOMENS PO     ZYRTEC 10 MG tablet  Generic drug:  cetirizine      TAKE these medications       ALPRAZolam 0.25 MG tablet  Commonly known as:  XANAX  Take 1 tablet (0.25 mg total) by mouth 3 (three) times daily as needed for anxiety.     buPROPion 150 MG 24 hr tablet  Commonly known as:  WELLBUTRIN XL  Take 1 tablet (150 mg total) by mouth daily.     Calcium Carbonate-Vitamin D 600-400 MG-UNIT per tablet  Commonly known as:  CALTRATE 600+D  Take 1 tablet by mouth 2 (two) times daily.     enoxaparin 40 MG/0.4ML injection  Commonly known as:  LOVENOX  Inject 0.4 mLs (40 mg total) into the skin daily.     levofloxacin 500 MG tablet  Commonly known as:  LEVAQUIN  Take 1 tablet (500 mg total) by mouth daily.     levothyroxine 100 MCG tablet  Commonly known as:  SYNTHROID, LEVOTHROID  Take 100 mcg by mouth daily.     methocarbamol 500 MG tablet  Commonly known as:  ROBAXIN  Take 1 tablet (500 mg total) by mouth 4 (four) times daily.     Oxycodone HCl 10 MG Tabs  Take 1 tablet (10 mg total) by mouth every 4 (four) hours as needed.     valACYclovir 500 MG tablet  Commonly known as:  VALTREX  Take 500 mg by mouth daily as needed. For outbreaks     VITAMIN C PO  Take 1 tablet by mouth daily.           Follow-up Information   Follow up with Ernestene Mention, MD. Schedule an  appointment as soon as possible for a visit in 3 weeks.   Contact information:   [address] [place] [address] [phone]       Signed: Rossie Muskrat 08/25/2012, 8:24 AM

## 2012-08-25 NOTE — Progress Notes (Signed)
5 Days Post-Op  Subjective: Continues to improve. She feels good this morning. She would like to go home. Afebrile.  Tachycardia less, also intermittent now.   SpO2 94-100% on room air. No shortness of breath. No cough or sputum production. Blood cultures negative to date.  Pathology report does show focus of invasive ductal carcinoma left breast, negative lymph nodes, negative margins. ER 85%, PR 0%, HER-2/neu pending. He 67 pending Right breast shows no occult carcinoma. I discussed this with the patient. She is to followup with Dr. Darnelle Catalan as outpatient. She will not need radiation therapy.  Objective: Vital signs in last 24 hours: Temp:  [97.9 F (36.6 C)-99.4 F (37.4 C)] 98.4 F (36.9 C) (02/19 0520) Pulse Rate:  [85-121] 100 (02/19 0520) Resp:  [16-20] 16 (02/19 0520) BP: (84-103)/(47-70) 87/58 mmHg (02/19 0520) SpO2:  [93 %-100 %] 94 % (02/19 0520) Last BM Date: 08/19/12  Intake/Output from previous day: 02/18 0701 - 02/19 0700 In: 2027.3 [P.O.:700; I.V.:1217.3; IV Piggyback:110] Out: 86 [Drains:86] Intake/Output this shift: Total I/O In: 411.3 [I.V.:411.3] Out: 20 [Drains:20]  General appearance: alert. Mental status normal. Appears to feel much better and much more animated. Resp: clear to auscultation bilaterally Breasts: , bilateral mastectomy skin flaps are pink and viable. Just a trace of epidermo-lysis lateral right incision. All drains functioning, serosanguineous. No hematoma or infection.  Lab Results:  No results found for this or any previous visit (from the past 24 hour(s)).   Studies/Results: @RISRSLT24 @  . buPROPion  150 mg Oral Daily  . docusate sodium  100 mg Oral BID  . enoxaparin (LOVENOX) injection  40 mg Subcutaneous Q24H  . ketorolac  1 drop Right Eye QID  . levalbuterol  2 puff Inhalation Q6H  . levothyroxine  100 mcg Oral QAC breakfast  . methocarbamol  500 mg Oral QID  . piperacillin-tazobactam (ZOSYN)  IV  3.375 g Intravenous Q8H  .  polyethylene glycol  17 g Oral Daily  . vancomycin  750 mg Intravenous Q12H     Assessment/Plan: s/p Procedure(s): SIMPLE MASTECTOMY WITH AXILLARY SENTINEL NODE BIOPSY TOTAL MASTECTOMY TISSUE EXPANDER  POD#5.Bilateral total mastectomy, left axillary Sentinel node biopsy, bilateral tissue expanders. Surgically wounds looked fine. No evidence of infection.  Pathology report discussed with patient. She will followup with Dr. Darnelle Catalan in 3-4 weeks. I think she is ready for discharge, and hopefully she will go home today  if Dr. Odis Luster and Dr. Elvera Lennox agree. She will follow up with Dr. Odis Luster frequently, and so I will see her in my office in 3-4 weeks.   Fever and tachycardia and leukocytosis. Suspected origin of this is pulmonary. Not exactly clear whether this is a hospital-acquired pneumonia or simply atelectasis superimposed on mild chronic asthma. Clearly improved. Probable transition to oral antibiotics, Levaquin, today as suggested by [facility] I believe this can be managed as an outpatient.   DVT prophylaxis. On Lovenox.   @PROBHOSP @  LOS: 5 days    Aleshka Corney M. Derrell Lolling, M.D., [facility] General and Minimally invasive Surgery Breast and Colorectal Surgery Office:   [phone] Pager:   [phone]  08/25/2012  . .prob

## 2012-08-25 NOTE — Progress Notes (Signed)
[facility] PROGRESS NOTE  [name] [name] EAV:[id number] DOB: June 25, 1961 DOA: 08/20/2012 PCP: Neena Rhymes, MD  Assessment/Plan: Fever No fever in the last 24 hours. Appears to be secondary to probable PNA based on clinical grounds and less on the CXR. Blood cultures were sent 08/23/2012, no growth to date, and patient was started on vancomycin and zosyn. Primary team planning on discharging the patient on po Levofloxacin to complete a 7 day course. Weaned off O2 as tolerated and encouraged incentive spirometry. Patient is now on room air and appears comfortable.   Tachycardia Secondary to ongoing infection, fever and pain. She is on an adequate pain regimen, encouraged patient to ask for her prn medications. TSH is normal.   Anxiety Patient seems to have a degree of anxiety since her diagnosis. At home, she used to use Xanax daily at night time. Her last dose was 2/15 and this might contribute to her tachycardia. Continue her on Xanax 0.25 TID PRN.   Bilateral mastectomy Post operative treatment per surgery/primary team. Outpatient follow up with oncology service.   Anemia Drop in hemoglobin likely dilutional.  Hypokalemia Replace as needed.  Leukocytosis Resolved, due to ongoing infection; vs demargination due to stress from surgery.   Code Status:  Family Communication: Discussed with patient.  Disposition Plan: Per primary service.   HPI/Subjective: No specific concerns. Denies any pain. Cough improving.  Objective: Filed Vitals:   08/24/12 2200 08/25/12 0121 08/25/12 0223 08/25/12 0520  BP: 86/49  99/55 87/58  Pulse: 119  110 100  Temp: 99.4 F (37.4 C)  98.7 F (37.1 C) 98.4 F (36.9 C)  TempSrc:      Resp: 18  18 16   Height:      Weight:      SpO2: 97% 98% 100% 94%    Intake/Output Summary (Last 24 hours) at 08/25/12 0919 Last data filed at 08/25/12 0520  Gross per 24 hour  Intake 2027.25 ml  Output     86 ml  Net 1941.25 ml   Filed Weights   08/20/12 1537  Weight: 68.13 kg (150 lb 3.2 oz)    Exam: Physical Exam: General: Awake, Oriented, No acute distress. HEENT: EOMI. Neck: Supple CV: S1 and S2 Lungs: Clear to ascultation bilaterally Abdomen: Soft, Nontender, Nondistended, +bowel sounds. Ext: Good pulses. Trace edema.  Data Reviewed: Basic Metabolic Panel:  Recent Labs Lab 08/23/12 0652 08/23/12 0903 08/25/12 0425  NA 134*  --  137  K 3.3*  --  3.1*  CL 95*  --  100  CO2 28  --  31  GLUCOSE 102*  --  94  BUN 9  --  4*  CREATININE 0.62  --  0.54  CALCIUM 8.7  --  8.1*  MG  --  2.0  --    Liver Function Tests: No results found for this basename: AST, ALT, ALKPHOS, BILITOT, PROT, ALBUMIN,  in the last 168 hours No results found for this basename: LIPASE, AMYLASE,  in the last 168 hours No results found for this basename: AMMONIA,  in the last 168 hours CBC:  Recent Labs Lab 08/22/12 1012 08/23/12 0652 08/25/12 0425  WBC 15.3* 13.7* 6.6  NEUTROABS 12.8*  --   --   HGB 11.6* 11.2* 9.1*  HCT 34.7* 33.6* 27.5*  MCV 87.4 87.7 87.6  PLT 178 194 177   Cardiac Enzymes: No results found for this basename: CKTOTAL, CKMB, CKMBINDEX, TROPONINI,  in the last 168 hours BNP (last 3 results) No results found for  this basename: PROBNP,  in the last 8760 hours CBG:  Recent Labs Lab 08/20/12 1602  GLUCAP 340*    Recent Results (from the past 240 hour(s))  URINE CULTURE     Status: None   Collection Time    08/22/12 11:18 AM      Result Value Range Status   Specimen Description URINE, CLEAN CATCH   Final   Special Requests NONE   Final   Culture  Setup Time 08/22/2012 20:14   Final   Colony Count NO GROWTH   Final   Culture NO GROWTH   Final   Report Status 08/23/2012 FINAL   Final  CULTURE, BLOOD (ROUTINE X 2)     Status: None   Collection Time    08/23/12  9:00 AM      Result Value Range Status   Specimen Description BLOOD RIGHT ARM   Final   Special Requests BOTTLES DRAWN AEROBIC ONLY 4CC    Final   Culture  Setup Time 08/23/2012 16:07   Final   Culture     Final   Value:        BLOOD CULTURE RECEIVED NO GROWTH TO DATE CULTURE WILL BE HELD FOR 5 DAYS BEFORE ISSUING A FINAL NEGATIVE REPORT   Report Status PENDING   Incomplete  CULTURE, BLOOD (ROUTINE X 2)     Status: None   Collection Time    08/23/12  9:15 AM      Result Value Range Status   Specimen Description BLOOD RIGHT ARM   Final   Special Requests BOTTLES DRAWN AEROBIC ONLY 3CC   Final   Culture  Setup Time 08/23/2012 16:07   Final   Culture     Final   Value:        BLOOD CULTURE RECEIVED NO GROWTH TO DATE CULTURE WILL BE HELD FOR 5 DAYS BEFORE ISSUING A FINAL NEGATIVE REPORT   Report Status PENDING   Incomplete     Studies: Dg Chest 2 View  08/23/2012  *RADIOLOGY REPORT*  Clinical Data: Fever.  History breast cancer status post bilateral mastectomy.  CHEST - 2 VIEW  Comparison: Chest x-ray 08/22/2012.  Findings: Postoperative changes of bilateral mastectomy are noted with bilateral tissue expanders and soft tissue drains in place. Subcutaneous emphysema is noted in the chest wall bilaterally. Surgical clips are present in the right axilla, suggesting prior nodal dissection.  Lung volumes are normal.  There are some bibasilar opacities favored to reflect subsegmental atelectasis with superimposed small bilateral pleural effusions (left greater than right).  No definite consolidative airspace disease. Pulmonary vasculature is normal.  Heart size and mediastinal contours are within normal limits.  IMPRESSION: 1.  Small bilateral pleural effusions with probable bibasilar subsegmental atelectasis. 2.  Postoperative changes, as above.   Original Report Authenticated By: Trudie Reed, M.D.     Scheduled Meds: . buPROPion  150 mg Oral Daily  . docusate sodium  100 mg Oral BID  . enoxaparin (LOVENOX) injection  40 mg Subcutaneous Q24H  . ketorolac  1 drop Right Eye QID  . levalbuterol  2 puff Inhalation Q6H  . levofloxacin   500 mg Oral Daily  . levothyroxine  100 mcg Oral QAC breakfast  . methocarbamol  500 mg Oral QID  . piperacillin-tazobactam (ZOSYN)  IV  3.375 g Intravenous Q8H  . polyethylene glycol  17 g Oral Daily  . potassium chloride  40 mEq Oral Once   Continuous Infusions:   Active Problems:   *  No active hospital problems. Cristal Ford, MD  [facility] Pager [phone]. If 7PM-7AM, please contact night-coverage at [email or web address], password [facility] 08/25/2012, 9:19 AM  LOS: 5 days

## 2012-08-27 ENCOUNTER — Telehealth (INDEPENDENT_AMBULATORY_CARE_PROVIDER_SITE_OTHER): Payer: Self-pay | Admitting: General Surgery

## 2012-08-27 ENCOUNTER — Ambulatory Visit: Payer: Managed Care, Other (non HMO) | Admitting: Family

## 2012-08-27 NOTE — Telephone Encounter (Signed)
Pt called with transportation problems.  She has bilateral masty on 08/20/12 (Dr. Derrell Lolling) with 4 drains in place.  She states she is getting along well at home.  Pt's husband is off only on Mondays; other days she has no transportation to [facility] for follow-up appts.  Pt has 8:30 am appt with Dr. Odis Luster on 08/30/12, so asking to see Dr. Derrell Lolling that day.  Dr. Derrell Lolling is in surgery all morning and not available to see her.  Per Marlowe Aschoff, Clinincal Mgr, added pt to see Dr. Gerrit Friends at 10:30 am for her initial recheck appt.

## 2012-08-29 LAB — CULTURE, BLOOD (ROUTINE X 2): Culture: NO GROWTH

## 2012-08-30 ENCOUNTER — Encounter (INDEPENDENT_AMBULATORY_CARE_PROVIDER_SITE_OTHER): Payer: PRIVATE HEALTH INSURANCE | Admitting: Surgery

## 2012-08-30 ENCOUNTER — Telehealth (INDEPENDENT_AMBULATORY_CARE_PROVIDER_SITE_OTHER): Payer: Self-pay | Admitting: General Surgery

## 2012-08-30 ENCOUNTER — Encounter (INDEPENDENT_AMBULATORY_CARE_PROVIDER_SITE_OTHER): Payer: Self-pay | Admitting: General Surgery

## 2012-08-30 NOTE — Progress Notes (Signed)
Patient came into clinic today and stopped at the front desk to advise that she saw Dr. Odis Luster this morning and he removed two of the four drains. I advised the patient to look out for the signs of infection and that if she has any problems to make sure to contact our office. Patient also advised to keep record of drainage for the two remaining drains, to only use antibacterial soap on her body until she has been give the clearance to shower. Advised the patient not to apply alcohol on the drain sites. Patient given adhesive remover to use on bandage application sites. Patient advised that she will be contacted once I am able to find a morning appointment for her to see Dr. Derrell Lolling post op. Patient supposed to see him in three weeks, surgery was 08/20/12.

## 2012-08-30 NOTE — Telephone Encounter (Signed)
Called and left message for patient on cell and home phone to advise there per Dr. Derrell Lolling:  "I think this patient must have been confused at the time of discharge. She was instructed to followup with Dr. Etter Sjogren in one week because of the bilateral breast reconstruction. Because she is being followed closely with him she was instructed to see me in 3 weeks. That is written in her discharge summary. If the patient is doing well from a general surgery standpoint, she may see me approximately 3 weeks from the date of discharge. If she is having any general surgery problems, then certainly bring her in to our office at the earliest convenience. Otherwise, she does not need to see Dr. Gerrit Friends today for a routine visit. Hmi"  Tried to contact Dr. Odis Luster office to confirm the when the patient has been set up to see him following surgery.

## 2012-08-31 ENCOUNTER — Encounter (INDEPENDENT_AMBULATORY_CARE_PROVIDER_SITE_OTHER): Payer: PRIVATE HEALTH INSURANCE | Admitting: General Surgery

## 2012-09-02 ENCOUNTER — Encounter: Payer: Self-pay | Admitting: Family Medicine

## 2012-09-03 ENCOUNTER — Telehealth (INDEPENDENT_AMBULATORY_CARE_PROVIDER_SITE_OTHER): Payer: Self-pay | Admitting: General Surgery

## 2012-09-03 NOTE — Telephone Encounter (Signed)
Called and left patient a message (with prior approval) advising she has been set up to see Dr. Derrell Lolling on 09/16/12 at 12:30. Advised in message that the notes from Dr. Maxcine Ham two office visits were received (he pulled the drains). Requested she call our office back to confirm the message was received.

## 2012-09-06 ENCOUNTER — Telehealth (INDEPENDENT_AMBULATORY_CARE_PROVIDER_SITE_OTHER): Payer: Self-pay

## 2012-09-06 NOTE — Telephone Encounter (Signed)
Pt called wanting to review her path report again. Pt states it was very early in the morning when Dr Derrell Lolling reviewed her path with her and she was not sure she heard and retained the full report. I reviewed the path result with pt and answered questions the pt had. The pt is to see her oncologist soon and will also review the path with him and discuss treatment plans. Pt state otherwise she is doing well and will call with any further questions or concerns.

## 2012-09-13 ENCOUNTER — Ambulatory Visit (HOSPITAL_BASED_OUTPATIENT_CLINIC_OR_DEPARTMENT_OTHER): Payer: No Typology Code available for payment source | Admitting: Oncology

## 2012-09-13 VITALS — BP 105/70 | HR 89 | Temp 97.3°F | Resp 20 | Ht 63.0 in | Wt 142.7 lb

## 2012-09-13 DIAGNOSIS — C50219 Malignant neoplasm of upper-inner quadrant of unspecified female breast: Secondary | ICD-10-CM

## 2012-09-13 DIAGNOSIS — Z170 Estrogen receptor positive status [ER+]: Secondary | ICD-10-CM

## 2012-09-13 DIAGNOSIS — C50212 Malignant neoplasm of upper-inner quadrant of left female breast: Secondary | ICD-10-CM

## 2012-09-13 MED ORDER — TAMOXIFEN CITRATE 20 MG PO TABS: 20.0000 mg | ORAL_TABLET | Freq: Every day | ORAL | Status: DC
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
## 2012-09-16 ENCOUNTER — Ambulatory Visit (INDEPENDENT_AMBULATORY_CARE_PROVIDER_SITE_OTHER): Payer: PRIVATE HEALTH INSURANCE | Admitting: General Surgery

## 2012-09-16 ENCOUNTER — Telehealth: Payer: Self-pay | Admitting: Oncology

## 2012-09-16 ENCOUNTER — Encounter (INDEPENDENT_AMBULATORY_CARE_PROVIDER_SITE_OTHER): Payer: Self-pay | Admitting: General Surgery

## 2012-09-16 VITALS — BP 110/68 | HR 97 | Temp 98.2°F | Resp 18 | Ht 63.0 in | Wt 142.0 lb

## 2012-09-16 DIAGNOSIS — C50219 Malignant neoplasm of upper-inner quadrant of unspecified female breast: Secondary | ICD-10-CM

## 2012-09-16 DIAGNOSIS — C50212 Malignant neoplasm of upper-inner quadrant of left female breast: Secondary | ICD-10-CM

## 2012-09-16 NOTE — Telephone Encounter (Signed)
LVOM FOR PT TO RETURN CALL IN RE TO APPT.  CALENDAR MAILED.

## 2012-09-16 NOTE — Patient Instructions (Signed)
You are recovering from your bilateral mastectomy with reconstruction without any obvious surgical complications.  It is important that you take a walk every day and that you keep your appointments with physical therapy to strengthen your arm and shoulder muscles and to get them completely stretched out.  Keep your appointments with Dr. Darnelle Catalan and Dr. Odis Luster.  Return to see Dr. Derrell Lolling in 3 months.

## 2012-09-16 NOTE — Progress Notes (Signed)
Patient ID: [name] [name], female   DOB: [birth year], 52 y.o.   MRN: [id number] History: This patient underwent bilateral total mastectomy, left sentinel lymph node biopsy, and insertion of tissue expanders by Dr. Odis Luster. Date of surgery 08/20/2012. Final pathology on the left side reveals extensive in situ cancer and a small focus of invasive ductal carcinoma, stage T1a, N0. HER-2 positive. ER 87%, PR 0. T. Ki-67  21%. Nodes negative. The right breast was benign. All of her drains have been removed and she has been referred to physical therapy in [facility] for her first appointment next Monday. She saw Dr. Darnelle Catalan on March 10 and he will start her on tamoxifen April 1. Apparently that will be her only adjuvant therapy.Radiation therapy not indicated.   She is feeling reasonably well. No pulmonary problems. A little weak but coming along. She is here with her daughter  Exam: Patient looks well. No distress. Bilateral mastectomy incisions are healing without any complication. No skin necrosis. No infection. No fluid collections. Tissue expanders have recently been inflated and the skin is a little bit tight. No arm swelling. No arm sensory and deficit. Range of motion right shoulder 160. Range of motion left shoulder 120.  Assessment: Extensive DCIS and focus of invasive carcinoma left breast, pathologic stage T1a, N0. Recovering uneventfully following bilateral total mastectomy, left SALN biopsy and tissue expanders  Plan: Daily walks and  physical therapy encouraged. Start tamoxifen April 1 Return to see me in 3 months.   Angelia Mould. Derrell Lolling, M.D., [facility] General and Minimally invasive Surgery Breast and Colorectal Surgery Office:   [phone] Pager:   [phone]

## 2012-09-23 ENCOUNTER — Telehealth: Payer: Self-pay | Admitting: *Deleted

## 2012-09-23 NOTE — Telephone Encounter (Signed)
Patient left message on triage stating that Wellbutrin needs to be increased. She is currently on 150mg  daily, do you wan to increase dose? Please advise.

## 2012-09-23 NOTE — Telephone Encounter (Signed)
Ok to increase to 300mg  of XR daily, #30, 6 refills

## 2012-09-24 MED ORDER — BUPROPION HCL ER (XL) 300 MG PO TB24: 300.0000 mg | ORAL_TABLET | ORAL | Status: DC

## 2012-09-24 NOTE — Telephone Encounter (Signed)
Msg left advising Rx sent.      KP 

## 2012-10-01 ENCOUNTER — Telehealth: Payer: Self-pay | Admitting: Family Medicine

## 2012-10-01 NOTE — Telephone Encounter (Signed)
Wellbutrin can decrease the effectiveness of Tamoxifen.  Obviously the Tamoxifen is more important so she should discuss next steps w/ her Oncologist

## 2012-10-01 NOTE — Telephone Encounter (Signed)
Patient states that she currently is taking wellbutrin and is supposed to start taking Tamoxifen on Monday. She says that she read that there could be a drug interaction between these two drugs and she would like to know what Dr. Beverely Low recommends she do?

## 2012-10-01 NOTE — Telephone Encounter (Signed)
Please advise.//AB/CMA 

## 2012-10-05 NOTE — Telephone Encounter (Signed)
Spoke with the pt and informed her of Dr. Rennis Golden recommendations below.  Pt stated that she spoke with nurse at the Oncologist and was told it would be okay.  She said she picked up the Tamoxifen and the information stated it would be an drug interaction, she is to start the Tamoxifen tomorrow.  She wanted to know she should she take the Wellbutrin today.  Informed the pt that she should call the Oncologist and see what they would like for her to do.  Asked the pt if she could go without the Wellbutrin and she stated yes, but would not.  Pt agreed and she will call her Oncologist today.//AB/CMA

## 2012-10-10 ENCOUNTER — Other Ambulatory Visit: Payer: Self-pay | Admitting: Oncology

## 2012-10-11 ENCOUNTER — Encounter (INDEPENDENT_AMBULATORY_CARE_PROVIDER_SITE_OTHER): Payer: Self-pay

## 2012-10-15 ENCOUNTER — Telehealth: Payer: Self-pay | Admitting: Family Medicine

## 2012-10-15 MED ORDER — ALPRAZOLAM 0.25 MG PO TABS: 0.2500 mg | ORAL_TABLET | Freq: Three times a day (TID) | ORAL | Status: DC | PRN

## 2012-10-15 NOTE — Telephone Encounter (Signed)
Refill: Alprazolam 0.25 mg tablet. Take 1 tablet by mouth three times daily as needed for anxiety. Qty 60. Last fill 07-28-12

## 2012-10-15 NOTE — Telephone Encounter (Signed)
Rx sent 

## 2012-10-15 NOTE — Telephone Encounter (Signed)
Last OV 07-19-12, last filled 06-16-12 #60 1

## 2012-10-15 NOTE — Telephone Encounter (Signed)
Ok for #60, 1 refill 

## 2012-10-21 ENCOUNTER — Other Ambulatory Visit: Payer: Self-pay | Admitting: Family

## 2012-10-21 ENCOUNTER — Telehealth: Payer: Self-pay | Admitting: Family Medicine

## 2012-10-21 NOTE — Telephone Encounter (Signed)
Refill- valacyclovir 500mg  tablets. Take one tablet by mouth daily. Qty 30 last fill 3.19.14

## 2012-10-25 NOTE — Telephone Encounter (Signed)
Rx has been sent to the pharmacy on (10-21-12).//AB/CMA

## 2012-11-02 ENCOUNTER — Telehealth (INDEPENDENT_AMBULATORY_CARE_PROVIDER_SITE_OTHER): Payer: Self-pay

## 2012-11-02 NOTE — Telephone Encounter (Signed)
Message copied by Ivory Broad on Tue Nov 02, 2012  2:37 PM ------      Message from: Rise Paganini      Created: Tue Nov 02, 2012 11:36 AM      Regarding: [name] [name]: [phone]       Patient stated that the cancer was on the left side, but more tissue taken from the right side. It appears to be and she would like to discuss this. Please call. Thank you. ------

## 2012-11-02 NOTE — Telephone Encounter (Signed)
I will need to examine int the office to evaluate this further.  Cannot tell from phone message.  hmi

## 2012-11-02 NOTE — Telephone Encounter (Signed)
I called the pt back.  She says the left side has more tissue there.  She thought it was just swollen at 1st but it hasn't changed.  Even the plastic surgeon notices it as she is getting her reconstruction with tissue expansion.  I told her I am not sure the answer to this and will speak to Dr Derrell Lolling and have him help her with that answer.

## 2012-11-04 ENCOUNTER — Telehealth: Payer: Self-pay | Admitting: Family Medicine

## 2012-11-04 NOTE — Telephone Encounter (Signed)
Spoke with the pt and informed her of Dr. Rennis Golden recommendation below.  Pt stated that she has spoken to the nurse at the Oncologist and she was told it was okay to wean herself off the Tamoxifen.  She was told she can do whatever she wants to do or whatever she feels comfortable doing, and she decided to wean off the Tamoxifen.  Pt stated that before she started on the Tamoxifen the Oncologist gave her a choose to take no meds or to take med's.  She was told if she took the Tamoxifen for 10 years it would decrease her reoccurrence 5%, and w/o the Tamoxifen she is at reoccurrence 10%.//AB/CMA

## 2012-11-04 NOTE — Telephone Encounter (Signed)
Pt notified and states that oncology said she could stay on the Wellbutrin. Pt stated that she is not liking the side effects of the Tamoxifen so she is weaning herself off. Advised she did not want anything but her Wellbutrin.

## 2012-11-04 NOTE — Telephone Encounter (Signed)
Pt was supposed to contact oncology about the use of Wellbutrin and Tamoxifen together.  I'm not sure what oncology advised but I do know she was concerned about it.  I have found that Citalopram (Celexa) does not interact w/ Tamoxifen and we can switch her if she is interested.

## 2012-11-04 NOTE — Telephone Encounter (Signed)
She should NOT stop the Tamoxifen w/out talking to her oncologist

## 2012-11-05 ENCOUNTER — Ambulatory Visit (INDEPENDENT_AMBULATORY_CARE_PROVIDER_SITE_OTHER): Payer: No Typology Code available for payment source | Admitting: Family Medicine

## 2012-11-05 VITALS — BP 126/62 | HR 89 | Temp 97.7°F | Wt 144.0 lb

## 2012-11-05 DIAGNOSIS — F329 Major depressive disorder, single episode, unspecified: Secondary | ICD-10-CM

## 2012-11-05 DIAGNOSIS — F3289 Other specified depressive episodes: Secondary | ICD-10-CM

## 2012-11-05 MED ORDER — CITALOPRAM HYDROBROMIDE 20 MG PO TABS: 20.0000 mg | ORAL_TABLET | Freq: Every day | ORAL | Status: DC

## 2012-11-05 MED ORDER — BUPROPION HCL ER (XL) 150 MG PO TB24: 150.0000 mg | ORAL_TABLET | Freq: Every day | ORAL | Status: DC

## 2012-11-05 NOTE — Patient Instructions (Addendum)
Decrease the Wellbutrin to 150mg  (new script at pharmacy) daily After 2-4 weeks decrease to every other day.  After another 2-4 weeks decrease to every 3rd day for 2-4 weeks and then stop entirely Start the Celexa daily Continue the Tamoxifen- I think that's the right decision Call with any questions or concerns Happy Spring!!!

## 2012-11-08 ENCOUNTER — Encounter (INDEPENDENT_AMBULATORY_CARE_PROVIDER_SITE_OTHER): Payer: Self-pay

## 2012-11-09 NOTE — Progress Notes (Signed)
  Subjective:    Patient ID: [name] [name], female    DOB: 02/04/1961, 52 y.o.   MRN: [id number]  HPI Med concerns- pt comes today to discuss interaction between Wellbutrin and Tamoxifen.  Was told by Onc that it was safe to take meds together but pt is confused as there is a clear warning about Wellbutrin decreasing the effectiveness of Tamoxifen.  Pt even considered stopping Tamoxifen but prefers to continue to reduce her risk of recurrence.  Pt also considering weaning off depression meds but admits being short tempered and easily agitated.   Review of Systems For ROS see HPI     Objective:   Physical Exam  Vitals reviewed. Constitutional: She is oriented to person, place, and time. She appears well-developed and well-nourished. No distress.  Neurological: She is alert and oriented to person, place, and time.  Psychiatric: She has a normal mood and affect. Her behavior is normal. Judgment and thought content normal.          Assessment & Plan:

## 2012-11-09 NOTE — Assessment & Plan Note (Signed)
Discussed interaction between Wellbutrin and tamoxifen and pt in agreement to wean wellbutrin.  Will switch to celexa as this doesn't have interaction w/ Tamoxifen.  Reviewed supportive care and red flags that should prompt return.  Pt expressed understanding and is in agreement w/ plan.

## 2012-11-15 ENCOUNTER — Ambulatory Visit (INDEPENDENT_AMBULATORY_CARE_PROVIDER_SITE_OTHER): Payer: No Typology Code available for payment source | Admitting: General Surgery

## 2012-11-15 ENCOUNTER — Encounter (INDEPENDENT_AMBULATORY_CARE_PROVIDER_SITE_OTHER): Payer: Self-pay | Admitting: General Surgery

## 2012-11-15 VITALS — BP 110/70 | HR 72 | Resp 18 | Ht 63.0 in | Wt 141.5 lb

## 2012-11-15 DIAGNOSIS — C50219 Malignant neoplasm of upper-inner quadrant of unspecified female breast: Secondary | ICD-10-CM

## 2012-11-15 DIAGNOSIS — C50212 Malignant neoplasm of upper-inner quadrant of left female breast: Secondary | ICD-10-CM

## 2012-11-15 NOTE — Patient Instructions (Signed)
All of your mastectomy wounds have healed very nicely. I do agree that there is some asymmetry with the tissue expanders in place. The left side appears larger than the right and the implant appears to be riding slightly lower on the left. I do not feel any residual breast tissue. There is some adipose tissue laterally that can be trimmed by Dr. Odis Luster, most likely.  Continue the tamoxifen and continue regular followups with Dr. Darnelle Catalan.  Return to see Dr. Derrell Lolling in early September after you have healed from your surgery to place the permanent implants.

## 2012-11-15 NOTE — Progress Notes (Signed)
Patient ID: [name] [name], female   DOB: [birth year], 52 y.o.   MRN: [id number] History: This patient returns for a postop visit. She underwent bilateral total mastectomy, left sentinel node biopsy and insertion of tissue expanders by Dr. Odis Luster on 08/20/2012. Final pathology left side revealed extensive in situ cancer and a small focus of invasive ductal carcinoma, stage TI A., N0, HER-2 positive, ER 87%, PR 0.  The right breast was benign. She continues to get saline expansion of her tissue expanders by Dr. Odis Luster. She plans exchange for permanent implants approximately July 1. Her only concern is the left side looks larger than the right and she worries that we left breast tissue behind and she will get recurrent cancer.  Exam: Patient was well. No distress Bilateral mastectomy incisions well healed. Skin healthy. No infection. No hematoma. No seroma. The left expander appears larger than the right. The left expander appears to ride a little bit lower than the right. There is more redundant adipose tissue laterally on the left than on the right but both sides have redundant adipose tissue. No apparent residual mammary gland tissue on either side. Range of motion both shoulders 180.degrees.  Assessment: Extensive DCIS and focus of invasive carcinoma left breast, pathologic stage T1a., N0. Recovering uneventfully following bilateral mastectomy, left sentinel node biopsy and tissue expanders Asymmetry with left side being larger than right. I encouraged her not to worry about that until she had her final plastic surgery. Most likely symmetry can be achieved at the time of her permanent implants. Also I tried to reassure her that I did not think there was any residual breast tissue. I reviewed her pathology with her which should have a very good prognosis  Plan: Continue physical therapy and daily walks Continue tamoxifen Exchange for permanent implants in approximately 6 weeks   return to see me in  early September.   Angelia Mould. Derrell Lolling, M.D., [facility] General and Minimally invasive Surgery Breast and Colorectal Surgery Office:   [phone] Pager:   [phone]

## 2012-11-24 ENCOUNTER — Other Ambulatory Visit: Payer: Self-pay | Admitting: Family Medicine

## 2012-12-10 ENCOUNTER — Other Ambulatory Visit: Payer: Self-pay | Admitting: *Deleted

## 2012-12-10 DIAGNOSIS — C50219 Malignant neoplasm of upper-inner quadrant of unspecified female breast: Secondary | ICD-10-CM

## 2012-12-13 ENCOUNTER — Encounter: Payer: Self-pay | Admitting: Physician Assistant

## 2012-12-13 ENCOUNTER — Telehealth: Payer: Self-pay | Admitting: *Deleted

## 2012-12-13 ENCOUNTER — Other Ambulatory Visit (HOSPITAL_BASED_OUTPATIENT_CLINIC_OR_DEPARTMENT_OTHER): Payer: No Typology Code available for payment source

## 2012-12-13 ENCOUNTER — Ambulatory Visit (HOSPITAL_BASED_OUTPATIENT_CLINIC_OR_DEPARTMENT_OTHER): Payer: No Typology Code available for payment source | Admitting: Physician Assistant

## 2012-12-13 VITALS — BP 108/72 | HR 72 | Temp 98.3°F | Resp 20 | Ht 63.0 in | Wt 144.6 lb

## 2012-12-13 DIAGNOSIS — C50212 Malignant neoplasm of upper-inner quadrant of left female breast: Secondary | ICD-10-CM

## 2012-12-13 DIAGNOSIS — C50219 Malignant neoplasm of upper-inner quadrant of unspecified female breast: Secondary | ICD-10-CM

## 2012-12-13 LAB — COMPREHENSIVE METABOLIC PANEL (CC13)
ALT: 13 U/L (ref 0–55)
AST: 16 U/L (ref 5–34)
Albumin: 3.7 g/dL (ref 3.5–5.0)
CO2: 30 mEq/L — ABNORMAL HIGH (ref 22–29)
Calcium: 9.4 mg/dL (ref 8.4–10.4)
Chloride: 105 mEq/L (ref 98–107)
Creatinine: 0.8 mg/dL (ref 0.6–1.1)
Potassium: 4.5 mEq/L (ref 3.5–5.1)
Sodium: 142 mEq/L (ref 136–145)
Total Protein: 7.1 g/dL (ref 6.4–8.3)

## 2012-12-13 LAB — CBC WITH DIFFERENTIAL/PLATELET
BASO%: 0.4 % (ref 0.0–2.0)
EOS%: 1.4 % (ref 0.0–7.0)
MCH: 28.6 pg (ref 25.1–34.0)
MCHC: 33.4 g/dL (ref 31.5–36.0)
MONO#: 0.5 10*3/uL (ref 0.1–0.9)
RBC: 4.16 10*6/uL (ref 3.70–5.45)
RDW: 15.2 % — ABNORMAL HIGH (ref 11.2–14.5)
WBC: 5.8 10*3/uL (ref 3.9–10.3)
lymph#: 2 10*3/uL (ref 0.9–3.3)
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
## 2012-12-13 NOTE — Telephone Encounter (Signed)
appts made and printed...td 

## 2012-12-17 ENCOUNTER — Ambulatory Visit: Payer: PRIVATE HEALTH INSURANCE | Admitting: Physician Assistant

## 2012-12-17 ENCOUNTER — Other Ambulatory Visit: Payer: PRIVATE HEALTH INSURANCE | Admitting: Lab

## 2012-12-21 ENCOUNTER — Other Ambulatory Visit: Payer: Self-pay | Admitting: Family Medicine

## 2012-12-22 ENCOUNTER — Ambulatory Visit (INDEPENDENT_AMBULATORY_CARE_PROVIDER_SITE_OTHER): Payer: No Typology Code available for payment source | Admitting: General Surgery

## 2012-12-22 NOTE — Telephone Encounter (Signed)
Last refill:11-24-12 Last OV:11-05-12 Please advise.//AB/CMA

## 2012-12-24 ENCOUNTER — Other Ambulatory Visit: Payer: Self-pay | Admitting: *Deleted

## 2012-12-24 MED ORDER — GABAPENTIN 100 MG PO CAPS: 100.0000 mg | ORAL_CAPSULE | Freq: Three times a day (TID) | ORAL | Status: DC

## 2012-12-24 MED ORDER — GABAPENTIN 100 MG PO CAPS: 100.0000 mg | ORAL_CAPSULE | Freq: Every day | ORAL | Status: DC

## 2012-12-24 NOTE — Telephone Encounter (Signed)
Pt called to this RN stating she would like to proceed to with use of neurontin for hot flashes as discussed at visit with AB/PA.  Prescription escribed to pharmacy.

## 2013-02-01 ENCOUNTER — Other Ambulatory Visit: Payer: Self-pay | Admitting: Family Medicine

## 2013-02-01 NOTE — Telephone Encounter (Signed)
Last refill:12-23-12 Last OV:11-05-12 Please advise.//AB/CMA

## 2013-02-02 NOTE — Telephone Encounter (Signed)
Rx printed and faxed to the pharmacy.//AB/CMA 

## 2013-02-09 ENCOUNTER — Ambulatory Visit: Payer: No Typology Code available for payment source | Admitting: Family Medicine

## 2013-03-05 ENCOUNTER — Other Ambulatory Visit: Payer: Self-pay | Admitting: Family Medicine

## 2013-03-08 NOTE — Telephone Encounter (Signed)
Med filled.  

## 2013-03-14 ENCOUNTER — Other Ambulatory Visit: Payer: Self-pay | Admitting: Family Medicine

## 2013-03-14 NOTE — Telephone Encounter (Signed)
Last filled: 02/01/13  Last visit: 11/05/12  UDS: 06/16/12, Low risk Contract on file  Please advise. SW, CMA

## 2013-03-21 ENCOUNTER — Other Ambulatory Visit (HOSPITAL_COMMUNITY)
Admission: RE | Admit: 2013-03-21 | Discharge: 2013-03-21 | Disposition: A | Discharge status: Home / Self Care | Payer: No Typology Code available for payment source | Source: Ambulatory Visit | Attending: Family Medicine | Admitting: Family Medicine

## 2013-03-21 ENCOUNTER — Ambulatory Visit (INDEPENDENT_AMBULATORY_CARE_PROVIDER_SITE_OTHER): Payer: No Typology Code available for payment source | Admitting: Family Medicine

## 2013-03-21 ENCOUNTER — Encounter: Payer: Self-pay | Admitting: Family Medicine

## 2013-03-21 VITALS — BP 112/70 | HR 68 | Temp 98.1°F | Ht 62.25 in | Wt 146.0 lb

## 2013-03-21 DIAGNOSIS — Z124 Encounter for screening for malignant neoplasm of cervix: Secondary | ICD-10-CM | POA: Insufficient documentation

## 2013-03-21 DIAGNOSIS — Z0000 Encounter for general adult medical examination without abnormal findings: Secondary | ICD-10-CM

## 2013-03-21 DIAGNOSIS — Z1151 Encounter for screening for human papillomavirus (HPV): Secondary | ICD-10-CM | POA: Insufficient documentation

## 2013-03-21 DIAGNOSIS — Z01419 Encounter for gynecological examination (general) (routine) without abnormal findings: Secondary | ICD-10-CM | POA: Insufficient documentation

## 2013-03-21 LAB — BASIC METABOLIC PANEL
BUN: 13 mg/dL (ref 6–23)
CO2: 31 mEq/L (ref 19–32)
Calcium: 9.6 mg/dL (ref 8.4–10.5)
GFR: 108.43 mL/min (ref >60.00)
Glucose, Bld: 81 mg/dL (ref 70–99)
Potassium: 4.3 mEq/L (ref 3.5–5.1)
Sodium: 137 mEq/L (ref 135–145)

## 2013-03-21 LAB — LIPID PANEL
Cholesterol: 176 mg/dL (ref 0–200)
VLDL: 25.2 mg/dL (ref 0.0–40.0)

## 2013-03-21 LAB — CBC WITH DIFFERENTIAL/PLATELET
Basophils Absolute: 0 10*3/uL (ref 0.0–0.1)
HCT: 37.8 % (ref 36.0–46.0)
Lymphs Abs: 2 10*3/uL (ref 0.7–4.0)
Monocytes Relative: 8.7 % (ref 3.0–12.0)
Neutrophils Relative %: 57.9 % (ref 43.0–77.0)
Platelets: 189 10*3/uL (ref 150.0–400.0)
RDW: 13.4 % (ref 11.5–14.6)
WBC: 6.3 10*3/uL (ref 4.5–10.5)

## 2013-03-21 LAB — HEPATIC FUNCTION PANEL
AST: 21 U/L (ref 0–37)
Albumin: 4.2 g/dL (ref 3.5–5.2)
Alkaline Phosphatase: 48 U/L (ref 39–117)
Total Protein: 7.2 g/dL (ref 6.0–8.3)

## 2013-03-21 NOTE — Progress Notes (Signed)
  Subjective:    Patient ID: [name] [name], female    DOB: February 07, 1961, 52 y.o.   MRN: [id number]  HPI CPE- UTD on colonoscopy, mammo, DEXA.  Pt desires pap today due to hx of breast cancer.   Review of Systems Patient reports no vision/ hearing changes, adenopathy,fever, weight change,  persistant/recurrent hoarseness , swallowing issues, chest pain, palpitations, edema, persistant/recurrent cough, hemoptysis, dyspnea (rest/exertional/paroxysmal nocturnal), gastrointestinal bleeding (melena, rectal bleeding), abdominal pain, significant heartburn, bowel changes, GU symptoms (dysuria, hematuria, incontinence), Gyn symptoms (abnormal  bleeding, pain),  syncope, focal weakness, memory loss, numbness & tingling, skin/hair/nail changes, abnormal bruising or bleeding, anxiety, or depression.     Objective:   Physical Exam  General Appearance:    Alert, cooperative, no distress, appears stated age  Head:    Normocephalic, without obvious abnormality, atraumatic  Eyes:    PERRL, conjunctiva/corneas clear, EOM's intact, fundi    benign, both eyes  Ears:    Normal TM's and external ear canals, both ears  Nose:   Nares normal, septum midline, mucosa normal, no drainage    or sinus tenderness  Throat:   Lips, mucosa, and tongue normal; teeth and gums normal  Neck:   Supple, symmetrical, trachea midline, no adenopathy;    Thyroid: no enlargement/tenderness/nodules  Back:     Symmetric, no curvature, ROM normal, no CVA tenderness  Lungs:     Clear to auscultation bilaterally, respirations unlabored  Chest Wall:    No tenderness or deformity   Heart:    Regular rate and rhythm, S1 and S2 normal, no murmur, rub   or gallop  Breast Exam:    S/p bilateral mastectomy w/ implants  Abdomen:     Soft, non-tender, bowel sounds active all four quadrants,    no masses, no organomegaly  Genitalia:    External genitalia normal, cervix normal in appearance, no CMT, uterus in normal size and position, adnexa  w/out mass or tenderness, mucosa pink and moist, no lesions or discharge present  Rectal:    Normal external appearance  Extremities:   Extremities normal, atraumatic, no cyanosis or edema  Pulses:   2+ and symmetric all extremities  Skin:   Skin color, texture, turgor normal, no rashes or lesions  Lymph nodes:   Cervical, supraclavicular, and axillary nodes normal  Neurologic:   CNII-XII intact, normal strength, sensation and reflexes    throughout          Assessment & Plan:

## 2013-03-21 NOTE — Assessment & Plan Note (Signed)
Pap collected. 

## 2013-03-21 NOTE — Patient Instructions (Addendum)
We'll notify you of your lab results and make any changes if needed Keep up the good work!  You look great! Call with any questions or concerns Happy Fall!

## 2013-03-21 NOTE — Assessment & Plan Note (Signed)
Pt's PE WNL.  UTD on health maintenance.  Check labs.  Anticipatory guidance provided.  

## 2013-03-24 LAB — VITAMIN D 1,25 DIHYDROXY
Vitamin D 1, 25 (OH)2 Total: 51 pg/mL (ref 18–72)
Vitamin D3 1, 25 (OH)2: 51 pg/mL

## 2013-03-31 ENCOUNTER — Ambulatory Visit (INDEPENDENT_AMBULATORY_CARE_PROVIDER_SITE_OTHER): Payer: No Typology Code available for payment source | Admitting: General Surgery

## 2013-03-31 ENCOUNTER — Encounter (INDEPENDENT_AMBULATORY_CARE_PROVIDER_SITE_OTHER): Payer: Self-pay | Admitting: General Surgery

## 2013-03-31 ENCOUNTER — Encounter (INDEPENDENT_AMBULATORY_CARE_PROVIDER_SITE_OTHER): Payer: Self-pay

## 2013-03-31 VITALS — BP 100/60 | HR 64 | Temp 98.0°F | Resp 14 | Ht 62.0 in | Wt 145.6 lb

## 2013-03-31 DIAGNOSIS — C50219 Malignant neoplasm of upper-inner quadrant of unspecified female breast: Secondary | ICD-10-CM

## 2013-03-31 DIAGNOSIS — C50212 Malignant neoplasm of upper-inner quadrant of left female breast: Secondary | ICD-10-CM

## 2013-03-31 NOTE — Progress Notes (Signed)
Patient ID: [name] [name], female   DOB: 09-Jun-1961, 52 y.o.   MRN: [id number]  History: This patient returns for LTFU.   She underwent bilateral total mastectomy, left sentinel node biopsy and insertion of tissue expanders by me and Dr. Odis Luster on 08/20/2012.  Final pathology revealed left side revealed extensive in situ cancer and a small focus of invasive ductal carcinoma, stage T1a, ., N0, HER-2 positive, ER 87%, PR 0. The right breast was benign.  She has had her expanders exchanged for implants and liposuction done. She is still concerned because the left side is larger than the right. She sees Dr. Odis Luster is going to take her back for more like a suction on the left.  She is on tamoxifen, tolerating that reasonably well, on gabapentin, towels with Dr. Darnelle Catalan.  ROS: 10 system review of systems is negative except as described above. Discomfort left inframammary area laterally is resolving. Recent annual physical with Dr. Beverely Low, no new medical problems  Exam: Patient is well. No distress. Axilla frustrated. Says the only issue is the asymmetry. Bilateral mastectomy incisions well healed. Skin very healthy. No infection, fluid. No nodules or ulcerations. No axillary adenopathy. Left side does appear larger than right. Excellent range of motion of shoulders. Neck: No adenopathy or mass Lungs: Clear to auscultation bile R. Heart: Regular in rhythm. No murmur. No ectopy.   Assessment:  Extensive DCIS and focus of invasive carcinoma left breast, ER+, pathologic stage T1a., N0.  Wounds are healed and there is no evidence of cancer on physical exam  Asymmetry with left side being larger than right.  This will require continued plastic surgical procedures. Return to see me in March, 2015 for physical exam Continue tamoxifen and followup [name] [name], M.D., [facility] General and Minimally invasive Surgery Breast and Colorectal Surgery Office:    [phone] Pager:   [phone]

## 2013-03-31 NOTE — Patient Instructions (Signed)
Examination of your mastectomy skin and all of the regional lymph nodes today is normal. There is no evidence of cancer.  Continue to take the tamoxifen, as long as Dr. Darnelle Catalan advises.  Continued followup with Dr. Odis Luster to try to achieve symmetry of your reconstruction.  Return to see Dr. Derrell Lolling in March 2015 for a physical exam.

## 2013-04-08 ENCOUNTER — Other Ambulatory Visit: Payer: Self-pay | Admitting: Family Medicine

## 2013-04-08 NOTE — Telephone Encounter (Signed)
Last OV 03-21-13 Med filled 03-05-13 #30 with 0 refills  Low risk

## 2013-04-22 ENCOUNTER — Other Ambulatory Visit: Payer: Self-pay | Admitting: Family Medicine

## 2013-04-22 NOTE — Telephone Encounter (Signed)
Med faxed   

## 2013-04-22 NOTE — Telephone Encounter (Signed)
Last OV 03-21-13 Med filled 03-14-13 #60 with 0 refills   Low risk

## 2013-05-12 ENCOUNTER — Other Ambulatory Visit: Payer: Self-pay

## 2013-05-20 ENCOUNTER — Other Ambulatory Visit: Payer: Self-pay | Admitting: Plastic Surgery

## 2013-05-24 ENCOUNTER — Other Ambulatory Visit: Payer: Self-pay | Admitting: Family Medicine

## 2013-05-24 NOTE — Telephone Encounter (Signed)
Med faxed   

## 2013-05-24 NOTE — Telephone Encounter (Signed)
Last OV 9-15 Med filled 10-17 #60 with 0  Low risk

## 2013-06-07 ENCOUNTER — Other Ambulatory Visit (HOSPITAL_BASED_OUTPATIENT_CLINIC_OR_DEPARTMENT_OTHER): Payer: No Typology Code available for payment source | Admitting: Lab

## 2013-06-07 DIAGNOSIS — C50212 Malignant neoplasm of upper-inner quadrant of left female breast: Secondary | ICD-10-CM

## 2013-06-07 DIAGNOSIS — C50219 Malignant neoplasm of upper-inner quadrant of unspecified female breast: Secondary | ICD-10-CM

## 2013-06-07 LAB — CBC WITH DIFFERENTIAL/PLATELET
BASO%: 0.4 % (ref 0.0–2.0)
Basophils Absolute: 0 10*3/uL (ref 0.0–0.1)
EOS%: 3.6 % (ref 0.0–7.0)
Eosinophils Absolute: 0.2 10*3/uL (ref 0.0–0.5)
HCT: 35.5 % (ref 34.8–46.6)
HGB: 11.5 g/dL — ABNORMAL LOW (ref 11.6–15.9)
LYMPH%: 43.2 % (ref 14.0–49.7)
MCH: 29.3 pg (ref 25.1–34.0)
MCHC: 32.3 g/dL (ref 31.5–36.0)
MCV: 90.9 fL (ref 79.5–101.0)
MONO#: 0.5 10*3/uL (ref 0.1–0.9)
MONO%: 10.4 % (ref 0.0–14.0)
NEUT#: 2.2 10*3/uL (ref 1.5–6.5)
NEUT%: 42.4 % (ref 38.4–76.8)
Platelets: 186 10*3/uL (ref 145–400)
RBC: 3.91 10*6/uL (ref 3.70–5.45)
RDW: 13.4 % (ref 11.2–14.5)
WBC: 5.2 10*3/uL (ref 3.9–10.3)
lymph#: 2.2 10*3/uL (ref 0.9–3.3)

## 2013-06-07 LAB — COMPREHENSIVE METABOLIC PANEL (CC13)
AST: 17 U/L (ref 5–34)
Alkaline Phosphatase: 52 U/L (ref 40–150)
Anion Gap: 8 mEq/L (ref 3–11)
BUN: 9.8 mg/dL (ref 7.0–26.0)
CO2: 27 mEq/L (ref 22–29)
Calcium: 9.1 mg/dL (ref 8.4–10.4)
Creatinine: 0.7 mg/dL (ref 0.6–1.1)
Glucose: 82 mg/dl (ref 70–140)
Sodium: 141 mEq/L (ref 136–145)
Total Bilirubin: <0.2 mg/dL (ref 0.20–1.20)

## 2013-06-14 ENCOUNTER — Telehealth: Payer: Self-pay | Admitting: *Deleted

## 2013-06-14 ENCOUNTER — Ambulatory Visit (HOSPITAL_BASED_OUTPATIENT_CLINIC_OR_DEPARTMENT_OTHER): Payer: No Typology Code available for payment source | Admitting: Oncology

## 2013-06-14 VITALS — BP 119/78 | HR 83 | Temp 98.0°F | Resp 18 | Ht 62.0 in | Wt 145.9 lb

## 2013-06-14 DIAGNOSIS — F411 Generalized anxiety disorder: Secondary | ICD-10-CM

## 2013-06-14 DIAGNOSIS — C50219 Malignant neoplasm of upper-inner quadrant of unspecified female breast: Secondary | ICD-10-CM

## 2013-06-14 DIAGNOSIS — Z9010 Acquired absence of unspecified breast and nipple: Secondary | ICD-10-CM

## 2013-06-14 DIAGNOSIS — Z170 Estrogen receptor positive status [ER+]: Secondary | ICD-10-CM

## 2013-06-14 NOTE — Telephone Encounter (Signed)
appts made and printed...td 
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
## 2013-06-16 ENCOUNTER — Other Ambulatory Visit: Payer: Self-pay | Admitting: Family Medicine

## 2013-06-17 NOTE — Telephone Encounter (Signed)
Med filled and faxed.  

## 2013-06-17 NOTE — Telephone Encounter (Signed)
Last OV 03-21-13 Med filled 05-24-13 #60 with 0  Low risk

## 2013-06-21 ENCOUNTER — Other Ambulatory Visit: Payer: Self-pay | Admitting: Family Medicine

## 2013-06-21 NOTE — Telephone Encounter (Signed)
Med filled.  

## 2013-07-19 ENCOUNTER — Encounter: Payer: Self-pay | Admitting: Family Medicine

## 2013-08-09 ENCOUNTER — Telehealth: Payer: Self-pay | Admitting: Family Medicine

## 2013-08-09 NOTE — Telephone Encounter (Signed)
Pt can decrease Celexa to 1/2 tab daily x2 weeks and then stop meds.  If in decreasing to 10mg  (1/2 tab) she feels her depression is returning, she should go back up to 20mg  and call for appt

## 2013-08-09 NOTE — Telephone Encounter (Signed)
Patient called stating she would like to go off her celexa and would like to know how she should do that. CB# [phone]

## 2013-08-09 NOTE — Telephone Encounter (Signed)
In reviewing the chart, it appears pt was changed from wellbutrin to celexa. Do you want her to come in and discuss this or [place] give the patient instructions over the phone?

## 2013-08-09 NOTE — Telephone Encounter (Signed)
Called pt and notified.

## 2013-08-18 ENCOUNTER — Other Ambulatory Visit: Payer: Self-pay | Admitting: Family Medicine

## 2013-08-18 NOTE — Telephone Encounter (Signed)
Last OV 03-21-13 Med filled 06-16-13 #60 with 0

## 2013-08-18 NOTE — Telephone Encounter (Signed)
Med filled and faxed.  

## 2013-08-24 ENCOUNTER — Encounter (INDEPENDENT_AMBULATORY_CARE_PROVIDER_SITE_OTHER): Payer: Self-pay | Admitting: General Surgery

## 2013-08-25 ENCOUNTER — Ambulatory Visit: Payer: No Typology Code available for payment source | Admitting: Family Medicine

## 2013-08-25 ENCOUNTER — Encounter: Payer: Self-pay | Admitting: Family Medicine

## 2013-08-25 ENCOUNTER — Ambulatory Visit (INDEPENDENT_AMBULATORY_CARE_PROVIDER_SITE_OTHER): Payer: No Typology Code available for payment source | Admitting: Family Medicine

## 2013-08-25 VITALS — BP 112/70 | HR 82 | Temp 97.8°F | Wt 152.0 lb

## 2013-08-25 DIAGNOSIS — J019 Acute sinusitis, unspecified: Secondary | ICD-10-CM

## 2013-08-25 MED ORDER — AMOXICILLIN-POT CLAVULANATE 875-125 MG PO TABS: ORAL_TABLET | ORAL | Status: DC

## 2013-08-25 MED ORDER — GUAIFENESIN-CODEINE 100-10 MG/5ML PO SYRP: ORAL_SOLUTION | ORAL | Status: DC

## 2013-08-25 NOTE — Progress Notes (Signed)
Pre visit review using our clinic review tool, if applicable. No additional management support is needed unless otherwise documented below in the visit note. 

## 2013-08-25 NOTE — Progress Notes (Signed)
  Subjective:     [name] [name] is a 53 y.o. female who presents for evaluation of sinus pain. Symptoms include: congestion, facial pain, nasal congestion and sinus pressure. Onset of symptoms was 4 days ago. Symptoms have been gradually worsening since that time. Past history is significant for no history of pneumonia or bronchitis. Patient is a non-smoker.  The following portions of the patient's history were reviewed and updated as appropriate: allergies, current medications, past family history, past medical history, past social history, past surgical history and problem list.  Review of Systems Pertinent items are noted in HPI.   Objective:    BP 112/70  Pulse 82  Temp(Src) 97.8 F (36.6 C) (Oral)  Wt 152 lb (68.947 kg)  SpO2 97% General appearance: alert, cooperative, appears stated age and no distress Ears: normal TM's and external ear canals both ears Nose: green discharge, moderate congestion, turbinates red, swollen, sinus tenderness bilateral Throat: normal findings: lips normal without lesions and oropharynx pink & moist without lesions or evidence of thrush Neck: no adenopathy, supple, symmetrical, trachea midline and thyroid not enlarged, symmetric, no tenderness/mass/nodules Lungs: clear to auscultation bilaterally Heart: S1, S2 normal    Assessment:    Acute bacterial sinusitis.    Plan:    Nasal steroids per medication orders. Antihistamines per medication orders. Augmentin per medication orders. f/u prn

## 2013-08-25 NOTE — Patient Instructions (Addendum)
Pick up nasacort or flonase at pharmacy  Sinusitis Sinusitis is redness, soreness, and swelling (inflammation) of the paranasal sinuses. Paranasal sinuses are air pockets within the bones of your face (beneath the eyes, the middle of the forehead, or above the eyes). In healthy paranasal sinuses, mucus is able to drain out, and air is able to circulate through them by way of your nose. However, when your paranasal sinuses are inflamed, mucus and air can become trapped. This can allow bacteria and other germs to grow and cause infection. Sinusitis can develop quickly and last only a short time (acute) or continue over a long period (chronic). Sinusitis that lasts for more than 12 weeks is considered chronic.  CAUSES  Causes of sinusitis include:  Allergies.  Structural abnormalities, such as displacement of the cartilage that separates your nostrils (deviated septum), which can decrease the air flow through your nose and sinuses and affect sinus drainage.  Functional abnormalities, such as when the small hairs (cilia) that line your sinuses and help remove mucus do not work properly or are not present. SYMPTOMS  Symptoms of acute and chronic sinusitis are the same. The primary symptoms are pain and pressure around the affected sinuses. Other symptoms include:  Upper toothache.  Earache.  Headache.  Bad breath.  Decreased sense of smell and taste.  A cough, which worsens when you are lying flat.  Fatigue.  Fever.  Thick drainage from your nose, which often is green and may contain pus (purulent).  Swelling and warmth over the affected sinuses. DIAGNOSIS  Your caregiver will perform a physical exam. During the exam, your caregiver may:  Look in your nose for signs of abnormal growths in your nostrils (nasal polyps).  Tap over the affected sinus to check for signs of infection.  View the inside of your sinuses (endoscopy) with a special imaging device with a light attached  (endoscope), which is inserted into your sinuses. If your caregiver suspects that you have chronic sinusitis, one or more of the following tests may be recommended:  Allergy tests.  Nasal culture A sample of mucus is taken from your nose and sent to a lab and screened for bacteria.  Nasal cytology A sample of mucus is taken from your nose and examined by your caregiver to determine if your sinusitis is related to an allergy. TREATMENT  Most cases of acute sinusitis are related to a viral infection and will resolve on their own within 10 days. Sometimes medicines are prescribed to help relieve symptoms (pain medicine, decongestants, nasal steroid sprays, or saline sprays).  However, for sinusitis related to a bacterial infection, your caregiver will prescribe antibiotic medicines. These are medicines that will help kill the bacteria causing the infection.  Rarely, sinusitis is caused by a fungal infection. In theses cases, your caregiver will prescribe antifungal medicine. For some cases of chronic sinusitis, surgery is needed. Generally, these are cases in which sinusitis recurs more than 3 times per year, despite other treatments. HOME CARE INSTRUCTIONS   Drink plenty of water. Water helps thin the mucus so your sinuses can drain more easily.  Use a humidifier.  Inhale steam 3 to 4 times a day (for example, sit in the bathroom with the shower running).  Apply a warm, moist washcloth to your face 3 to 4 times a day, or as directed by your caregiver.  Use saline nasal sprays to help moisten and clean your sinuses.  Take over-the-counter or prescription medicines for pain, discomfort, or fever only  as directed by your caregiver. SEEK IMMEDIATE MEDICAL CARE IF:  You have increasing pain or severe headaches.  You have nausea, vomiting, or drowsiness.  You have swelling around your face.  You have vision problems.  You have a stiff neck.  You have difficulty breathing. MAKE SURE  YOU:   Understand these instructions.  Will watch your condition.  Will get help right away if you are not doing well or get worse. Document Released: 06/23/2005 Document Revised: 09/15/2011 Document Reviewed: 07/08/2011 [facility] Patient Information 2014 [place], [place].

## 2013-09-06 ENCOUNTER — Telehealth: Payer: Self-pay | Admitting: Family Medicine

## 2013-09-06 ENCOUNTER — Other Ambulatory Visit: Payer: Self-pay | Admitting: Oncology

## 2013-09-06 NOTE — Telephone Encounter (Signed)
Patient called and stated that she was see on 08/25/2013 by dr Etter Sjogren for sinusitis. She did not take her antibiotic until the next day because she felt horrible so she missed work on  08/29/2013. She needs a letter putting her out from 08/25/2013 until 08/30/2012. Her original letter had her returning back on the 08/29/2013. Please advise.

## 2013-09-06 NOTE — Telephone Encounter (Signed)
Letter printed and pt notified.

## 2013-09-07 ENCOUNTER — Other Ambulatory Visit: Payer: Self-pay | Admitting: *Deleted

## 2013-09-16 ENCOUNTER — Other Ambulatory Visit: Payer: Self-pay | Admitting: Family Medicine

## 2013-09-16 NOTE — Telephone Encounter (Signed)
Med filled and faxed.  

## 2013-09-16 NOTE — Telephone Encounter (Signed)
Last ov 03-21-13, (seen by lowne 08/25/13 sinusitis) Med filled 08-18-13 #60 with 0  Low risk

## 2013-10-06 ENCOUNTER — Telehealth: Payer: Self-pay | Admitting: Physician Assistant

## 2013-10-06 ENCOUNTER — Other Ambulatory Visit: Payer: Self-pay | Admitting: Physician Assistant

## 2013-10-06 NOTE — Telephone Encounter (Signed)
, °

## 2013-10-13 ENCOUNTER — Ambulatory Visit (INDEPENDENT_AMBULATORY_CARE_PROVIDER_SITE_OTHER): Payer: No Typology Code available for payment source | Admitting: General Surgery

## 2013-10-17 ENCOUNTER — Ambulatory Visit: Payer: No Typology Code available for payment source | Admitting: Physician Assistant

## 2013-10-17 ENCOUNTER — Other Ambulatory Visit: Payer: No Typology Code available for payment source

## 2013-10-29 ENCOUNTER — Other Ambulatory Visit: Payer: Self-pay | Admitting: Oncology

## 2013-10-29 DIAGNOSIS — C50219 Malignant neoplasm of upper-inner quadrant of unspecified female breast: Secondary | ICD-10-CM

## 2013-11-18 ENCOUNTER — Emergency Department (HOSPITAL_COMMUNITY)
Admission: EM | Admit: 2013-11-18 | Discharge: 2013-11-18 | Disposition: A | Discharge status: Home / Self Care | Payer: No Typology Code available for payment source | Attending: Emergency Medicine | Admitting: Emergency Medicine

## 2013-11-18 ENCOUNTER — Encounter (HOSPITAL_COMMUNITY): Payer: Self-pay | Admitting: Emergency Medicine

## 2013-11-18 ENCOUNTER — Emergency Department (HOSPITAL_COMMUNITY): Payer: No Typology Code available for payment source

## 2013-11-18 ENCOUNTER — Encounter: Payer: Self-pay | Admitting: Physician Assistant

## 2013-11-18 ENCOUNTER — Other Ambulatory Visit (HOSPITAL_BASED_OUTPATIENT_CLINIC_OR_DEPARTMENT_OTHER): Payer: No Typology Code available for payment source

## 2013-11-18 ENCOUNTER — Ambulatory Visit (HOSPITAL_BASED_OUTPATIENT_CLINIC_OR_DEPARTMENT_OTHER): Payer: No Typology Code available for payment source | Admitting: Physician Assistant

## 2013-11-18 VITALS — BP 113/77 | HR 76 | Temp 97.9°F | Resp 18 | Ht 62.0 in | Wt 148.3 lb

## 2013-11-18 DIAGNOSIS — F3289 Other specified depressive episodes: Secondary | ICD-10-CM | POA: Insufficient documentation

## 2013-11-18 DIAGNOSIS — J45909 Unspecified asthma, uncomplicated: Secondary | ICD-10-CM | POA: Insufficient documentation

## 2013-11-18 DIAGNOSIS — Z87891 Personal history of nicotine dependence: Secondary | ICD-10-CM | POA: Insufficient documentation

## 2013-11-18 DIAGNOSIS — C50219 Malignant neoplasm of upper-inner quadrant of unspecified female breast: Secondary | ICD-10-CM

## 2013-11-18 DIAGNOSIS — E0500 Thyrotoxicosis with diffuse goiter without thyrotoxic crisis or storm: Secondary | ICD-10-CM | POA: Insufficient documentation

## 2013-11-18 DIAGNOSIS — Z79899 Other long term (current) drug therapy: Secondary | ICD-10-CM | POA: Insufficient documentation

## 2013-11-18 DIAGNOSIS — R1310 Dysphagia, unspecified: Secondary | ICD-10-CM

## 2013-11-18 DIAGNOSIS — R4702 Dysphasia: Secondary | ICD-10-CM

## 2013-11-18 DIAGNOSIS — K5900 Constipation, unspecified: Secondary | ICD-10-CM

## 2013-11-18 DIAGNOSIS — R410 Disorientation, unspecified: Secondary | ICD-10-CM

## 2013-11-18 DIAGNOSIS — F411 Generalized anxiety disorder: Secondary | ICD-10-CM | POA: Insufficient documentation

## 2013-11-18 DIAGNOSIS — F419 Anxiety disorder, unspecified: Secondary | ICD-10-CM

## 2013-11-18 DIAGNOSIS — D0590 Unspecified type of carcinoma in situ of unspecified breast: Secondary | ICD-10-CM

## 2013-11-18 DIAGNOSIS — F29 Unspecified psychosis not due to a substance or known physiological condition: Secondary | ICD-10-CM

## 2013-11-18 DIAGNOSIS — Z8719 Personal history of other diseases of the digestive system: Secondary | ICD-10-CM | POA: Insufficient documentation

## 2013-11-18 DIAGNOSIS — F329 Major depressive disorder, single episode, unspecified: Secondary | ICD-10-CM | POA: Insufficient documentation

## 2013-11-18 DIAGNOSIS — R4182 Altered mental status, unspecified: Secondary | ICD-10-CM

## 2013-11-18 DIAGNOSIS — Z170 Estrogen receptor positive status [ER+]: Secondary | ICD-10-CM

## 2013-11-18 DIAGNOSIS — Z853 Personal history of malignant neoplasm of breast: Secondary | ICD-10-CM | POA: Insufficient documentation

## 2013-11-18 DIAGNOSIS — E039 Hypothyroidism, unspecified: Secondary | ICD-10-CM | POA: Insufficient documentation

## 2013-11-18 DIAGNOSIS — N6459 Other signs and symptoms in breast: Secondary | ICD-10-CM

## 2013-11-18 DIAGNOSIS — R4789 Other speech disturbances: Secondary | ICD-10-CM | POA: Insufficient documentation

## 2013-11-18 DIAGNOSIS — Z8619 Personal history of other infectious and parasitic diseases: Secondary | ICD-10-CM | POA: Insufficient documentation

## 2013-11-18 LAB — CBC WITH DIFFERENTIAL/PLATELET
BASO%: 0.3 % (ref 0.0–2.0)
BASOS ABS: 0 10*3/uL (ref 0.0–0.1)
EOS ABS: 0.1 10*3/uL (ref 0.0–0.5)
EOS%: 1.7 % (ref 0.0–7.0)
HEMATOCRIT: 36.9 % (ref 34.8–46.6)
HEMOGLOBIN: 12.2 g/dL (ref 11.6–15.9)
LYMPH#: 2.2 10*3/uL (ref 0.9–3.3)
LYMPH%: 34.1 % (ref 14.0–49.7)
MCH: 29.1 pg (ref 25.1–34.0)
MCHC: 33.1 g/dL (ref 31.5–36.0)
MCV: 87.7 fL (ref 79.5–101.0)
MONO#: 0.6 10*3/uL (ref 0.1–0.9)
MONO%: 8.5 % (ref 0.0–14.0)
NEUT%: 55.4 % (ref 38.4–76.8)
NEUTROS ABS: 3.6 10*3/uL (ref 1.5–6.5)
Platelets: 200 10*3/uL (ref 145–400)
RBC: 4.21 10*6/uL (ref 3.70–5.45)
RDW: 13.1 % (ref 11.2–14.5)
WBC: 6.5 10*3/uL (ref 3.9–10.3)

## 2013-11-18 LAB — URINALYSIS, ROUTINE W REFLEX MICROSCOPIC
BILIRUBIN URINE: NEGATIVE
Glucose, UA: NEGATIVE mg/dL
HGB URINE DIPSTICK: NEGATIVE
Ketones, ur: NEGATIVE mg/dL
NITRITE: NEGATIVE
Protein, ur: NEGATIVE mg/dL
Specific Gravity, Urine: 1.014 (ref 1.005–1.030)
UROBILINOGEN UA: 0.2 mg/dL (ref 0.0–1.0)
pH: 7.5 (ref 5.0–8.0)

## 2013-11-18 LAB — COMPREHENSIVE METABOLIC PANEL (CC13)
ALBUMIN: 3.8 g/dL (ref 3.5–5.0)
ALT: 19 U/L (ref 0–55)
ANION GAP: 11 meq/L (ref 3–11)
AST: 20 U/L (ref 5–34)
Alkaline Phosphatase: 58 U/L (ref 40–150)
BUN: 9.6 mg/dL (ref 7.0–26.0)
CO2: 24 meq/L (ref 22–29)
Calcium: 9.5 mg/dL (ref 8.4–10.4)
Chloride: 108 mEq/L (ref 98–109)
Creatinine: 0.8 mg/dL (ref 0.6–1.1)
Glucose: 92 mg/dl (ref 70–140)
POTASSIUM: 4.4 meq/L (ref 3.5–5.1)
Sodium: 143 mEq/L (ref 136–145)
Total Bilirubin: 0.27 mg/dL (ref 0.20–1.20)
Total Protein: 7 g/dL (ref 6.4–8.3)

## 2013-11-18 LAB — URINE MICROSCOPIC-ADD ON

## 2013-11-18 LAB — TSH: TSH: 0.986 u[IU]/mL (ref 0.350–4.500)

## 2013-11-18 MED ORDER — GADOBENATE DIMEGLUMINE 529 MG/ML IV SOLN
13.0000 mL | Freq: Once | INTRAVENOUS | Status: AC | PRN
  Administered 2013-11-18: 13 mL via INTRAVENOUS

## 2013-11-18 NOTE — Discharge Instructions (Signed)
Confusion °Confusion is the inability to think with your usual speed or clarity. Confusion may come on quickly or slowly over time. How quickly the confusion comes on depends on the cause. Confusion can be due to any number of causes. °CAUSES  °· Concussion, head injury, or head trauma. °· Seizures. °· Stroke. °· Fever. °· Senility. °· Heightened emotional states like rage or terror. °· Mental illness in which the person loses the ability to determine what is real and what is not (hallucinations). °· Infections. °· Toxic effects from alcohol, drugs, or prescription medicines. °· Dehydration and an imbalance of salts in the body (electrolytes). °· Lack of sleep. °· Low blood sugar (diabetes). °· Low levels of oxygen (for example from chronic lung disorders). °· Drug interactions or other medication side effects. °· Nutritional deficiencies, especially niacin, thiamine, vitamin C, or vitamin B. °· Sudden drop in body temperature (hypothermia). °· Illness in the elderly. Constipation can result in confusion. An elderly person who is hospitalized may become confused due to change in daily routine. °SYMPTOMS  °People often describe their thinking as cloudy or unclear when they are confused. Confusion can also include feeling disoriented. That means you are unaware of where or who you are. You may also not know what the date or time is. If confused, you may also have difficulty paying attention, remembering and making decisions. Some people also act aggressively when they are confused.  °DIAGNOSIS  °The medical evaluation of confusion may include: °· Blood and urine tests. °· X-rays. °· Brain and nervous system tests. °· Analyzing your brain waves (electroencphalogram or EEG). °· A special X-ray (MRI) of your head or other special studies. °Your physician will ask questions such as: °· Do you get days and nights mixed up? °· Are you awake during regular sleep times? °· Do you have trouble recognizing people? °· Do you  know where you are? °· Do you know the date and time? °· Does the confusion come and go? °· Is the confusion quickly getting worse? °· Has there been a recent illness? °· Has there been a recent head injury? °· Are you diabetic? °· Do you have a lung disorder? °· What medication are you taking? °· Have you taken drugs or alcohol? °TREATMENT  °An admission to the hospital may not be needed, but a confused person should not be left alone. Stay with a family member or friend until the confusion clears. Avoid alcohol, pain relievers or sedative drugs until you have fully recovered. Do not drive until your caregiver says it is okay. °HOME CARE INSTRUCTIONS °What family and friends can do: °· To find out if someone is confused ask him or her their name, age, and the date. If the person is unsure or answers incorrectly, he or she is confused. °· Always introduce yourself, no matter how well the person knows you. °· Often remind the person of his or her location. °· Place a calendar and clock near the confused person. °· Talk about current events and plans for the day. °· Try to keep the environment calm, quiet and peaceful. °· Make sure the patient keeps follow up appointments with their physician. °PREVENTION  °Ways to prevent confusion: °· Avoid alcohol. °· Eat a balanced diet. °· Get enough sleep. °· Do not become isolated. Spend time with other people and make plans for your days. °· Keep careful watch on your blood sugar levels if you are diabetic. °SEEK IMMEDIATE MEDICAL CARE IF:  °· You develop   severe headaches, repeated vomiting, seizures, blackouts or slurred speech. °· There is increasing confusion, weakness, numbness, restlessness or personality changes. °· You develop a loss of balance, have marked dizziness, feel uncoordinated or fall. °· You have delusions, hallucinations or develop severe anxiety. °· Your family members think you need to be rechecked. °Document Released: 07/31/2004 Document Revised:  09/15/2011 Document Reviewed: 03/28/2008 °[employer]® Patient Information ©2014 [employer]. ° °
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
## 2013-11-18 NOTE — ED Notes (Signed)
Pt alert, arrives from cancer center, per pt sent for MRI, states she has been having episodes of expressive aphasia, confusion, onset several weeks ago, resp even unlabored, speech clear.
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
## 2013-11-21 ENCOUNTER — Other Ambulatory Visit: Payer: Self-pay | Admitting: Physician Assistant

## 2013-11-21 ENCOUNTER — Telehealth: Payer: Self-pay

## 2013-11-21 DIAGNOSIS — Z853 Personal history of malignant neoplasm of breast: Secondary | ICD-10-CM

## 2013-11-21 DIAGNOSIS — N63 Unspecified lump in unspecified breast: Secondary | ICD-10-CM

## 2013-11-21 NOTE — Telephone Encounter (Signed)
Let pt know she needs to stay off tamoxifen, she needs to see Dr. Birdie Riddle, and scheduling will call her with appt for ultrasound and followup with AB on 6/3.  Pt voiced understanding.

## 2013-11-21 NOTE — Telephone Encounter (Signed)
LMOVM - Pt to return call to clinic.   

## 2013-11-22 ENCOUNTER — Telehealth: Payer: Self-pay | Admitting: Oncology

## 2013-11-22 ENCOUNTER — Other Ambulatory Visit: Payer: Self-pay | Admitting: Physician Assistant

## 2013-11-22 DIAGNOSIS — Z853 Personal history of malignant neoplasm of breast: Secondary | ICD-10-CM

## 2013-11-22 DIAGNOSIS — N63 Unspecified lump in unspecified breast: Secondary | ICD-10-CM

## 2013-11-22 NOTE — Telephone Encounter (Signed)
S/w the pt and she is aware of her mammo/us breast appt in [facility] at [facility]

## 2013-11-24 ENCOUNTER — Ambulatory Visit (INDEPENDENT_AMBULATORY_CARE_PROVIDER_SITE_OTHER): Payer: No Typology Code available for payment source | Admitting: Family Medicine

## 2013-11-24 ENCOUNTER — Encounter: Payer: Self-pay | Admitting: Family Medicine

## 2013-11-24 ENCOUNTER — Telehealth: Payer: Self-pay

## 2013-11-24 ENCOUNTER — Ambulatory Visit: Payer: No Typology Code available for payment source | Admitting: Family Medicine

## 2013-11-24 VITALS — BP 110/74 | HR 85 | Temp 98.2°F | Resp 17 | Wt 146.0 lb

## 2013-11-24 DIAGNOSIS — F809 Developmental disorder of speech and language, unspecified: Secondary | ICD-10-CM

## 2013-11-24 DIAGNOSIS — K5900 Constipation, unspecified: Secondary | ICD-10-CM

## 2013-11-24 DIAGNOSIS — R4789 Other speech disturbances: Secondary | ICD-10-CM | POA: Insufficient documentation

## 2013-11-24 MED ORDER — POLYETHYLENE GLYCOL 3350 17 GM/SCOOP PO POWD: 17.0000 g | Freq: Every day | ORAL | Status: DC

## 2013-11-24 NOTE — Progress Notes (Signed)
   Subjective:    Patient ID: [name] [name], female    DOB: [birth year], 53 y.o.   MRN: [id number]  HPI Word finding difficulty- pt was seen by oncologist on Friday and they sent pt to ER for evaluation due to confusion and word finding difficulty.  Had normal MRI, TSH.  Stopped Tamoxifen 1 week ago.  Pt reports some dizziness- 'i can be up and walking and then all of sudden, i'm off to one side'.  Dizziness has improved since stopping med.  Pt also feels word finding is improving since stopping medication.  Constipation- ongoing issue for pt.  Reports she is attempting to change diet and increase water intake.  Not interested in taking stimulant.   Review of Systems For ROS see HPI     Objective:   Physical Exam  Vitals reviewed. Constitutional: She is oriented to person, place, and time. She appears well-developed and well-nourished. No distress.  HENT:  Head: Normocephalic and atraumatic.  Cardiovascular: Normal rate, regular rhythm, normal heart sounds and intact distal pulses.   Pulmonary/Chest: Effort normal and breath sounds normal. No respiratory distress. She has no wheezes. She has no rales.  Neurological: She is alert and oriented to person, place, and time. No cranial nerve deficit. Coordination normal.  No obvious word finding difficulty during OV  Skin: Skin is warm and dry.          Assessment & Plan:

## 2013-11-24 NOTE — Assessment & Plan Note (Signed)
New.  Reviewed pt's MRI and recent TSH- both normal.  Pt firmly believes this is due to Tamoxifen use.  Feels sxs have already improved in the 1 week since stopping med.  Offered Neuro referral- pt prefers to hold on this and call me if sxs don't resolve.  Will follow closely.

## 2013-11-24 NOTE — Assessment & Plan Note (Signed)
Recurrent issue for pt.  Pt to start daily Miralax.  Script provided.  Will follow.

## 2013-11-24 NOTE — Patient Instructions (Signed)
Follow up as needed If you continue to have word finding issues or confusion or dizziness after next 1-2 weeks, please call so we can send you to Neuro Start the Miralax daily- check w/ the pharmacy as to which is cheaper (OTC vs prescription) Keep up the good work!  You look great! Call with any questions or concerns [place]!!!

## 2013-11-24 NOTE — Telephone Encounter (Signed)
Pharmacy called to verify miralax Rx. Was ordered for 3350g--ok's to change to 1 month supply (567g).

## 2013-11-24 NOTE — Progress Notes (Signed)
Pre visit review using our clinic review tool, if applicable. No additional management support is needed unless otherwise documented below in the visit note. 

## 2013-12-01 ENCOUNTER — Ambulatory Visit (INDEPENDENT_AMBULATORY_CARE_PROVIDER_SITE_OTHER): Payer: No Typology Code available for payment source | Admitting: General Surgery

## 2013-12-02 ENCOUNTER — Other Ambulatory Visit: Payer: Self-pay | Admitting: Physician Assistant

## 2013-12-02 ENCOUNTER — Ambulatory Visit
Admission: RE | Admit: 2013-12-02 | Discharge: 2013-12-02 | Disposition: A | Discharge status: Home / Self Care | Payer: No Typology Code available for payment source | Source: Ambulatory Visit | Attending: Physician Assistant | Admitting: Physician Assistant

## 2013-12-02 DIAGNOSIS — Z853 Personal history of malignant neoplasm of breast: Secondary | ICD-10-CM

## 2013-12-02 DIAGNOSIS — N63 Unspecified lump in unspecified breast: Secondary | ICD-10-CM

## 2013-12-07 ENCOUNTER — Encounter: Payer: Self-pay | Admitting: Physician Assistant

## 2013-12-07 ENCOUNTER — Ambulatory Visit (HOSPITAL_BASED_OUTPATIENT_CLINIC_OR_DEPARTMENT_OTHER): Payer: No Typology Code available for payment source | Admitting: Physician Assistant

## 2013-12-07 VITALS — BP 125/84 | HR 98 | Temp 98.0°F | Resp 17 | Ht 62.0 in | Wt 148.8 lb

## 2013-12-07 DIAGNOSIS — C50219 Malignant neoplasm of upper-inner quadrant of unspecified female breast: Secondary | ICD-10-CM

## 2013-12-07 DIAGNOSIS — N951 Menopausal and female climacteric states: Secondary | ICD-10-CM

## 2013-12-07 DIAGNOSIS — M8580 Other specified disorders of bone density and structure, unspecified site: Secondary | ICD-10-CM

## 2013-12-07 DIAGNOSIS — Z170 Estrogen receptor positive status [ER+]: Secondary | ICD-10-CM

## 2013-12-07 DIAGNOSIS — Z853 Personal history of malignant neoplasm of breast: Secondary | ICD-10-CM | POA: Insufficient documentation

## 2013-12-07 DIAGNOSIS — N63 Unspecified lump in unspecified breast: Secondary | ICD-10-CM

## 2013-12-07 MED ORDER — ANASTROZOLE 1 MG PO TABS: 1.0000 mg | ORAL_TABLET | Freq: Every day | ORAL | Status: DC

## 2013-12-07 MED ORDER — GABAPENTIN 300 MG PO CAPS: 300.0000 mg | ORAL_CAPSULE | Freq: Every evening | ORAL | Status: DC | PRN
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
## 2013-12-13 ENCOUNTER — Other Ambulatory Visit: Payer: Self-pay | Admitting: Physician Assistant

## 2013-12-13 DIAGNOSIS — Z853 Personal history of malignant neoplasm of breast: Secondary | ICD-10-CM

## 2013-12-13 DIAGNOSIS — N63 Unspecified lump in unspecified breast: Secondary | ICD-10-CM

## 2013-12-16 ENCOUNTER — Telehealth: Payer: Self-pay | Admitting: Oncology

## 2013-12-16 NOTE — Telephone Encounter (Signed)
lmonvm for pt re appts for sept 2015 and [place] at [facility] nov 2015. schedule mailed.

## 2013-12-22 ENCOUNTER — Telehealth: Payer: Self-pay | Admitting: Oncology

## 2013-12-22 NOTE — Telephone Encounter (Signed)
returned and s.w pt call and r/s lab....done...pt ok and aware

## 2014-01-02 ENCOUNTER — Other Ambulatory Visit: Payer: No Typology Code available for payment source

## 2014-01-02 ENCOUNTER — Ambulatory Visit: Payer: No Typology Code available for payment source | Admitting: Physician Assistant

## 2014-01-10 ENCOUNTER — Telehealth: Payer: Self-pay

## 2014-01-10 NOTE — Telephone Encounter (Signed)
Returned pt call.  Pt reports she started taking Arimidex 7/1 and has experienced onset of symptoms: fatigue, headache all the way to the back of her neck, pain in her fingers, elbow and knees, blurred vision.  Pt states she does not want to continue to take Arimidex, that she is okay with the 90% cure rate statistic with the surgery only.  Per LC , it is okay for patient to stop taking Arimidex if that is what she wants.  Pt voiced understanding.

## 2014-01-23 ENCOUNTER — Ambulatory Visit (INDEPENDENT_AMBULATORY_CARE_PROVIDER_SITE_OTHER): Payer: PRIVATE HEALTH INSURANCE | Admitting: General Surgery

## 2014-01-23 ENCOUNTER — Encounter (INDEPENDENT_AMBULATORY_CARE_PROVIDER_SITE_OTHER): Payer: Self-pay | Admitting: General Surgery

## 2014-01-23 VITALS — BP 122/62 | HR 68 | Temp 98.0°F | Resp 18 | Ht 63.0 in | Wt 147.0 lb

## 2014-01-23 DIAGNOSIS — C50212 Malignant neoplasm of upper-inner quadrant of left female breast: Secondary | ICD-10-CM

## 2014-01-23 DIAGNOSIS — C50219 Malignant neoplasm of upper-inner quadrant of unspecified female breast: Secondary | ICD-10-CM

## 2014-01-23 NOTE — Patient Instructions (Signed)
Examination of your right breast and regional lymph nodes is normal. Examination of the left breast and regional lymph node is normal except for the small focal area of scar tissue centrally behind the incision. This is most likely scar.  Be sure to get a followup ultrasound of her left breast in September, and if the nodule centrally behind the scar is stable then we will simply follow that by ultrasound. If the nodule gets larger then call me and we should excise this area for examination under the microscope.  Otherwise, return to see Dr. Dalbert Batman in one year.

## 2014-01-23 NOTE — Progress Notes (Signed)
Patient ID: [name] [name], female   DOB: 09-27-1960, 53 y.o.   MRN: [id number]   History:  This patient returns for LTFU.  She underwent bilateral total mastectomy, left sentinel node biopsy and insertion of tissue expanders by me and Dr. Harlow [name] on 08/20/2012.  Final pathology revealed left side revealed extensive in situ cancer and a small focus of invasive ductal carcinoma, stage T1a, ., N0, HER-2 positive, ER 87%, PR 0. The right breast was benign.  She has had her expanders exchanged for implants and liposuction done. She is still concerned because the left side is larger than the right.She has had further light dissection. She still notices and asymmetry. She Developed confusion and memory problems on tamoxifen and that was discontinued after negative of brain MRI. She tried arimidex  and developed blurred vision and that has also been discontinued. She felt some thickening behind the left mastectomy incision. She states that she had an ultrasound and it was thought to be scar tissue. Six-month followup ultrasound is due in September. Last seen at [facility] on 12/07/2013.  ROS:  10 system review of systems is negative except as described above. Discomfort left inframammary area laterally is resolved.  Past history, family history, social history are documented on the chart, unchanged, and noncontributory except as described above.  Exam:  Patient is well. No distress.  Bilateral mastectomy incisions well healed. Skin very healthy. No infection, fluid. In the right breast laterally I can feel the edge of the implant as a linear prominence. In the left breast there is a tiny 5 mm firm nodular area just under the scar. No skin change. No axillary adenopathy. Left side does appear larger than right. Left inframammary crease seems to hang a little lower than the right. .Excellent range of motion of shoulders.  Neck: No adenopathy or mass  Lungs: Clear to auscultation bile R.   Heart: Regular in rhythm. No murmur. No ectopy.   Assessment:  Extensive DCIS and focus of invasive carcinoma left breast, ER+, pathologic stage T1a., N0.  Wounds are healed . Question of tiny nodular area of fibrosis, left mastectomy incision, central. Asymmetry with left side being larger than right. This will require continued plastic surgical procedures.   Plan: Followup left breast ultrasound in September, 2015. If the nodule the left are made stable and continued followup every 6 months. If the nodule on the left enlarges, consider excision in the OR to protect the implant Return to see me in one year for physical exam  Continue followup [name] [name], M.D., [facility]  General and Minimally invasive Surgery  Breast and Colorectal Surgery  Office: [phone]  Pager: [phone]

## 2014-01-30 ENCOUNTER — Ambulatory Visit: Payer: PRIVATE HEALTH INSURANCE | Admitting: Family Medicine

## 2014-02-06 ENCOUNTER — Other Ambulatory Visit: Payer: Self-pay | Admitting: Family Medicine

## 2014-02-06 NOTE — Telephone Encounter (Signed)
Last OV 11-24-13 Alprazolam last filled 09-16-13 #60 with 3  Low Risk

## 2014-02-08 NOTE — Telephone Encounter (Signed)
Can print rx as requested.

## 2014-02-08 NOTE — Telephone Encounter (Signed)
Med filled and faxed.  

## 2014-03-06 ENCOUNTER — Telehealth: Payer: Self-pay | Admitting: Family Medicine

## 2014-03-06 NOTE — Telephone Encounter (Signed)
pt cld  left VM in re to appt time-cld to confirm time & date with pt-pt understood

## 2014-03-15 ENCOUNTER — Encounter: Payer: Self-pay | Admitting: Gastroenterology

## 2014-03-22 ENCOUNTER — Ambulatory Visit (HOSPITAL_BASED_OUTPATIENT_CLINIC_OR_DEPARTMENT_OTHER)
Admission: RE | Admit: 2014-03-22 | Discharge: 2014-03-22 | Disposition: A | Discharge status: Home / Self Care | Payer: No Typology Code available for payment source | Source: Ambulatory Visit | Attending: Family Medicine | Admitting: Family Medicine

## 2014-03-22 ENCOUNTER — Other Ambulatory Visit (HOSPITAL_COMMUNITY)
Admission: RE | Admit: 2014-03-22 | Discharge: 2014-03-22 | Disposition: A | Discharge status: Home / Self Care | Payer: No Typology Code available for payment source | Source: Ambulatory Visit | Attending: Family Medicine | Admitting: Family Medicine

## 2014-03-22 ENCOUNTER — Other Ambulatory Visit: Payer: Self-pay | Admitting: Family Medicine

## 2014-03-22 ENCOUNTER — Encounter: Payer: Self-pay | Admitting: Family Medicine

## 2014-03-22 ENCOUNTER — Ambulatory Visit (INDEPENDENT_AMBULATORY_CARE_PROVIDER_SITE_OTHER): Payer: No Typology Code available for payment source | Admitting: Family Medicine

## 2014-03-22 VITALS — BP 124/70 | HR 81 | Temp 98.0°F | Resp 16 | Ht 62.0 in | Wt 140.1 lb

## 2014-03-22 DIAGNOSIS — R05 Cough: Secondary | ICD-10-CM | POA: Insufficient documentation

## 2014-03-22 DIAGNOSIS — R053 Chronic cough: Secondary | ICD-10-CM

## 2014-03-22 DIAGNOSIS — R059 Cough, unspecified: Secondary | ICD-10-CM

## 2014-03-22 DIAGNOSIS — M8580 Other specified disorders of bone density and structure, unspecified site: Secondary | ICD-10-CM

## 2014-03-22 DIAGNOSIS — M949 Disorder of cartilage, unspecified: Secondary | ICD-10-CM

## 2014-03-22 DIAGNOSIS — M899 Disorder of bone, unspecified: Secondary | ICD-10-CM

## 2014-03-22 DIAGNOSIS — Z1151 Encounter for screening for human papillomavirus (HPV): Secondary | ICD-10-CM | POA: Diagnosis present

## 2014-03-22 DIAGNOSIS — Z01419 Encounter for gynecological examination (general) (routine) without abnormal findings: Secondary | ICD-10-CM | POA: Insufficient documentation

## 2014-03-22 DIAGNOSIS — Z23 Encounter for immunization: Secondary | ICD-10-CM

## 2014-03-22 DIAGNOSIS — Z124 Encounter for screening for malignant neoplasm of cervix: Secondary | ICD-10-CM | POA: Insufficient documentation

## 2014-03-22 MED ORDER — OMEPRAZOLE 40 MG PO CPDR: 40.0000 mg | DELAYED_RELEASE_CAPSULE | Freq: Every day | ORAL | Status: DC

## 2014-03-22 NOTE — Assessment & Plan Note (Signed)
Pt has hx of this.  Due for repeat DEXA.  Check vitamin d level.  Encouraged at least 1000 units daily.

## 2014-03-22 NOTE — Assessment & Plan Note (Signed)
Pap collected. 

## 2014-03-22 NOTE — Assessment & Plan Note (Signed)
Pt's PE WNL.  UTD on mammo, colonoscopy.  Pap collected.  Check labs.  Anticipatory guidance provided.

## 2014-03-22 NOTE — Progress Notes (Signed)
Pre visit review using our clinic review tool, if applicable. No additional management support is needed unless otherwise documented below in the visit note. 

## 2014-03-22 NOTE — Progress Notes (Signed)
   Subjective:    Patient ID: [name] [name], female    DOB: 30-Oct-1960, 53 y.o.   MRN: [id number]  HPI CPE- UTD on colonoscopy, mammo.  Due for DEXA.  Pt requesting yearly pap due to breast CA.   Review of Systems Patient reports no vision/ hearing changes, adenopathy,fever, weight change,  persistant/recurrent hoarseness , swallowing issues, chest pain, palpitations, edema, hemoptysis, dyspnea (rest/exertional/paroxysmal nocturnal), gastrointestinal bleeding (melena, rectal bleeding), abdominal pain, significant heartburn, bowel changes, GU symptoms (dysuria, hematuria, incontinence), Gyn symptoms (abnormal  bleeding, pain),  syncope, focal weakness, memory loss, numbness & tingling, skin/hair/nail changes, abnormal bruising or bleeding  + anxiety/depression due to hostile work environment.  + persistent cough- has had cough since 2013.  Normal CXR.  Known allergies.  Intermittently productive    Objective:   Physical Exam  General Appearance:    Alert, cooperative, no distress, appears stated age  Head:    Normocephalic, without obvious abnormality, atraumatic  Eyes:    PERRL, conjunctiva/corneas clear, EOM's intact, fundi    benign, both eyes  Ears:    Normal TM's and external ear canals, both ears  Nose:   Nares normal, septum midline, mucosa normal, no drainage    or sinus tenderness  Throat:   Lips, mucosa, and tongue normal; teeth and gums normal  Neck:   Supple, symmetrical, trachea midline, no adenopathy;    Thyroid: no enlargement/tenderness/nodules  Back:     Symmetric, no curvature, ROM normal, no CVA tenderness  Lungs:     Clear to auscultation bilaterally, respirations unlabored  Chest Wall:    No tenderness or deformity   Heart:    Regular rate and rhythm, S1 and S2 normal, no murmur, rub   or gallop  Breast Exam:    No tenderness, masses, or nipple abnormality  Abdomen:     Soft, non-tender, bowel sounds active all four quadrants,    no masses, no organomegaly    Genitalia:    External genitalia normal, cervix normal in appearance, no CMT, uterus in normal size and position, adnexa w/out mass or tenderness, mucosa pink and moist, no lesions or discharge present  Rectal:    Normal external appearance  Extremities:   Extremities normal, atraumatic, no cyanosis or edema  Pulses:   2+ and symmetric all extremities  Skin:   Skin color, texture, turgor normal, no rashes or lesions  Lymph nodes:   Cervical, supraclavicular, and axillary nodes normal  Neurologic:   CNII-XII intact, normal strength, sensation and reflexes    throughout          Assessment & Plan:

## 2014-03-22 NOTE — Patient Instructions (Signed)
Follow up in 1 year or as needed We'll notify you of your lab results and make any changes if needed Get your CXR downstairs on your way out We'll call you with your bone density appt Add a Vit D supplement of at least 1000 units daily Start the Omeprazole daily for possible silent reflux as the cause of your cough.  If no improvement in 2-3 weeks, call me and we'll refer you to Pulmonary Call with any questions or concerns Hang in there!!  You're doing great!

## 2014-03-22 NOTE — Assessment & Plan Note (Signed)
New to provider.  Pt has previously seen Melissa (NP) for this.  Has had normal CXR x2.  On allergy meds.  Discussed possibility of silent reflux.  Start PPI.  Get repeat CXR.  If no improvement, will need pulmonary referral.  Pt expressed understanding and is in agreement w/ plan.

## 2014-03-23 ENCOUNTER — Other Ambulatory Visit: Payer: No Typology Code available for payment source

## 2014-03-23 LAB — HEPATIC FUNCTION PANEL
ALT: 24 U/L (ref 0–35)
AST: 25 U/L (ref 0–37)
Albumin: 4.3 g/dL (ref 3.5–5.2)
Alkaline Phosphatase: 68 U/L (ref 39–117)
BILIRUBIN DIRECT: 0 mg/dL (ref 0.0–0.3)
Total Bilirubin: 0.4 mg/dL (ref 0.2–1.2)
Total Protein: 7.8 g/dL (ref 6.0–8.3)

## 2014-03-23 LAB — CBC WITH DIFFERENTIAL/PLATELET
BASOS ABS: 0 10*3/uL (ref 0.0–0.1)
Basophils Relative: 0.3 % (ref 0.0–3.0)
EOS ABS: 0.1 10*3/uL (ref 0.0–0.7)
Eosinophils Relative: 1.5 % (ref 0.0–5.0)
HCT: 38 % (ref 36.0–46.0)
Hemoglobin: 12.6 g/dL (ref 12.0–15.0)
LYMPHS ABS: 2 10*3/uL (ref 0.7–4.0)
Lymphocytes Relative: 35.7 % (ref 12.0–46.0)
MCHC: 33.1 g/dL (ref 30.0–36.0)
MCV: 86.5 fl (ref 78.0–100.0)
MONO ABS: 0.4 10*3/uL (ref 0.1–1.0)
Monocytes Relative: 7.7 % (ref 3.0–12.0)
NEUTROS PCT: 54.8 % (ref 43.0–77.0)
Neutro Abs: 3.1 10*3/uL (ref 1.4–7.7)
Platelets: 210 10*3/uL (ref 150.0–400.0)
RBC: 4.39 Mil/uL (ref 3.87–5.11)
RDW: 13.4 % (ref 11.5–15.5)
WBC: 5.6 10*3/uL (ref 4.0–10.5)

## 2014-03-23 LAB — LIPID PANEL
CHOL/HDL RATIO: 3
CHOLESTEROL: 195 mg/dL (ref 0–200)
HDL: 64.7 mg/dL (ref >39.00)
LDL Cholesterol: 119 mg/dL — ABNORMAL HIGH (ref 0–99)
NonHDL: 130.3
TRIGLYCERIDES: 55 mg/dL (ref 0.0–149.0)
VLDL: 11 mg/dL (ref 0.0–40.0)

## 2014-03-23 LAB — BASIC METABOLIC PANEL
BUN: 11 mg/dL (ref 6–23)
CO2: 28 mEq/L (ref 19–32)
Calcium: 9.7 mg/dL (ref 8.4–10.5)
Chloride: 103 mEq/L (ref 96–112)
Creatinine, Ser: 0.6 mg/dL (ref 0.4–1.2)
GFR: 104.08 mL/min (ref >60.00)
Glucose, Bld: 83 mg/dL (ref 70–99)
POTASSIUM: 4.2 meq/L (ref 3.5–5.1)
Sodium: 138 mEq/L (ref 135–145)

## 2014-03-23 LAB — VITAMIN D 25 HYDROXY (VIT D DEFICIENCY, FRACTURES): VITD: 60.88 ng/mL (ref 30.00–100.00)

## 2014-03-24 ENCOUNTER — Other Ambulatory Visit: Payer: No Typology Code available for payment source

## 2014-03-24 DIAGNOSIS — Z853 Personal history of malignant neoplasm of breast: Secondary | ICD-10-CM

## 2014-03-24 LAB — CYTOLOGY - PAP

## 2014-03-30 ENCOUNTER — Ambulatory Visit (HOSPITAL_BASED_OUTPATIENT_CLINIC_OR_DEPARTMENT_OTHER): Payer: No Typology Code available for payment source | Admitting: Oncology

## 2014-03-30 VITALS — BP 114/69 | HR 90 | Temp 97.8°F | Resp 18 | Ht 62.0 in | Wt 137.6 lb

## 2014-03-30 DIAGNOSIS — C50219 Malignant neoplasm of upper-inner quadrant of unspecified female breast: Secondary | ICD-10-CM
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
## 2014-03-31 NOTE — Addendum Note (Signed)
Addended by: Laureen Abrahams on: 03/31/2014 05:40 PM   Modules accepted: Medications

## 2014-05-15 ENCOUNTER — Other Ambulatory Visit: Payer: No Typology Code available for payment source

## 2014-05-25 ENCOUNTER — Ambulatory Visit
Admission: RE | Admit: 2014-05-25 | Discharge: 2014-05-25 | Disposition: A | Discharge status: Home / Self Care | Payer: No Typology Code available for payment source | Source: Ambulatory Visit | Attending: Oncology | Admitting: Oncology

## 2014-05-25 DIAGNOSIS — Z853 Personal history of malignant neoplasm of breast: Secondary | ICD-10-CM

## 2014-05-25 DIAGNOSIS — N63 Unspecified lump in unspecified breast: Secondary | ICD-10-CM

## 2014-05-26 ENCOUNTER — Telehealth: Payer: Self-pay | Admitting: Family Medicine

## 2014-05-26 MED ORDER — ALPRAZOLAM 0.25 MG PO TABS: 0.2500 mg | ORAL_TABLET | Freq: Three times a day (TID) | ORAL | Status: DC | PRN

## 2014-05-26 NOTE — Telephone Encounter (Signed)
Caller name: Ashleynicole Relation to pt: self Call back number: [phone] Pharmacy: [facility] in [place]  Reason for call:   Patient states that she had called the pharmacy requesting refills. Patient requesting a refill on alprazolam

## 2014-05-26 NOTE — Telephone Encounter (Signed)
Last OV 03-22-14 Alprazolam last filled 09-16-13 #60 with 3   Med filled per verbal ok.

## 2014-08-25 ENCOUNTER — Other Ambulatory Visit: Payer: Self-pay | Admitting: Family Medicine

## 2014-08-25 ENCOUNTER — Ambulatory Visit: Payer: No Typology Code available for payment source | Admitting: Family Medicine

## 2014-08-25 NOTE — Telephone Encounter (Signed)
Med filled and faxed.  

## 2014-08-25 NOTE — Telephone Encounter (Signed)
Last OV 03-22-14 Alprazolam last filled 05-31-14 #60 with 3

## 2014-09-25 ENCOUNTER — Other Ambulatory Visit: Payer: Self-pay | Admitting: Family Medicine

## 2014-09-25 NOTE — Telephone Encounter (Signed)
Refill granted. Rx printed, signed and faxed to pharmacy. Will need repeat office visit with Dr. Birdie Riddle before further refills.

## 2014-09-25 NOTE — Telephone Encounter (Signed)
Med filled and faxed.  

## 2014-09-25 NOTE — Telephone Encounter (Signed)
Last OV 03-22-14 Alprazolam last filled 08-25-14 #60 with 0

## 2014-10-11 ENCOUNTER — Encounter: Payer: Self-pay | Admitting: Family Medicine

## 2014-10-11 ENCOUNTER — Ambulatory Visit (INDEPENDENT_AMBULATORY_CARE_PROVIDER_SITE_OTHER): Payer: No Typology Code available for payment source | Admitting: Family Medicine

## 2014-10-11 VITALS — BP 122/72 | HR 85 | Temp 98.3°F | Resp 16 | Wt 134.4 lb

## 2014-10-11 DIAGNOSIS — F32A Depression, unspecified: Secondary | ICD-10-CM

## 2014-10-11 DIAGNOSIS — F329 Major depressive disorder, single episode, unspecified: Secondary | ICD-10-CM | POA: Diagnosis not present

## 2014-10-11 DIAGNOSIS — F419 Anxiety disorder, unspecified: Secondary | ICD-10-CM

## 2014-10-11 MED ORDER — BUPROPION HCL ER (XL) 150 MG PO TB24
150.0000 mg | ORAL_TABLET | Freq: Every day | ORAL | Status: DC
Start: 2014-10-11 — End: 2014-11-09

## 2014-10-11 NOTE — Patient Instructions (Signed)
Follow up in 4-6 weeks to recheck mood and sleep Start the Wellbutrin daily Try and find a stress outlet- you deserve it! Call with any questions or concerns Hang in there!!!

## 2014-10-11 NOTE — Progress Notes (Signed)
Pre visit review using our clinic review tool, if applicable. No additional management support is needed unless otherwise documented below in the visit note. 

## 2014-10-11 NOTE — Progress Notes (Signed)
   Subjective:    Patient ID: [name] [name], female    DOB: [birth year], 54 y.o.   MRN: [id number]  HPI Depression- pt reports she is under 'so much stress'.  Has been 'trying to deal with it on my own'.  This is causing increased fatigue and insomnia.  Pt is not interested in resuming medications.  Pt will use xanax prior to bed.  Pt reports she will cry on the way to work and cry on the way home.  'i'm so mean'.   Review of Systems For ROS see HPI     Objective:   Physical Exam  Constitutional: She is oriented to person, place, and time. She appears well-developed and well-nourished. No distress (tearful, anxious).  HENT:  Head: Normocephalic and atraumatic.  Neurological: She is alert and oriented to person, place, and time. No cranial nerve deficit. Coordination normal.  Skin: Skin is warm and dry.  Psychiatric:  Tearful, anxious  Vitals reviewed.         Assessment & Plan:

## 2014-10-15 NOTE — Assessment & Plan Note (Signed)
Deteriorated.  Pt is resistant to restarting meds but understands that she is not able to continue like this.  Will restart Wellbutrin as pt feels this worked well for her in the past.  Will follow closely.

## 2014-11-07 ENCOUNTER — Other Ambulatory Visit: Payer: Self-pay | Admitting: Family Medicine

## 2014-11-08 ENCOUNTER — Telehealth: Payer: Self-pay | Admitting: Family Medicine

## 2014-11-08 NOTE — Telephone Encounter (Signed)
Caller name: Kaleigha Relation to ET:KKOE Call back number: [phone] Pharmacy[facility] in [place]   Reason for call:   Patient states that she feels like wellbutrin is not working for her. She says that she doesn't know if she has given the med enough time to work or if she needs to come in any sooner than her already scheduled appointment on 11/22/14

## 2014-11-08 NOTE — Telephone Encounter (Signed)
Med filled.  

## 2014-11-08 NOTE — Telephone Encounter (Signed)
Appointment scheduled for 11/09/14 at 2:45pm

## 2014-11-08 NOTE — Telephone Encounter (Signed)
Please call pt and see if we can get her in some time this week if possible. Ok for a SDA if needed.

## 2014-11-09 ENCOUNTER — Encounter: Payer: Self-pay | Admitting: Family Medicine

## 2014-11-09 ENCOUNTER — Ambulatory Visit (INDEPENDENT_AMBULATORY_CARE_PROVIDER_SITE_OTHER): Payer: No Typology Code available for payment source | Admitting: Family Medicine

## 2014-11-09 VITALS — BP 124/76 | HR 74 | Temp 98.0°F | Resp 16 | Wt 131.5 lb

## 2014-11-09 DIAGNOSIS — F32A Depression, unspecified: Secondary | ICD-10-CM

## 2014-11-09 DIAGNOSIS — F329 Major depressive disorder, single episode, unspecified: Secondary | ICD-10-CM | POA: Diagnosis not present

## 2014-11-09 MED ORDER — BUPROPION HCL ER (XL) 300 MG PO TB24: 300.0000 mg | ORAL_TABLET | Freq: Every day | ORAL | Status: DC

## 2014-11-09 NOTE — Assessment & Plan Note (Signed)
Unchanged.  Pt's sxs have not improved since starting Wellbutrin.  Will titrate to 300mg  and monitor closely for improvement.  Will refer to psychiatry for ongoing med management in case increasing medication is ineffective.  Pt agreeable to plan.  Able to contract today for safety- denies SI/HI.

## 2014-11-09 NOTE — Patient Instructions (Signed)
Follow up in 2-3 weeks to recheck mood We'll call you with names and numbers of local psychiatrists so you can schedule an appt Increase the Wellbutrin to 300mg  daily- 2 of what you have at home and 1 of the new prescription Call with any questions or concerns Hang in there!  You can do this!!!

## 2014-11-09 NOTE — Progress Notes (Signed)
   Subjective:    Patient ID: [name] [name], female    DOB: 22-May-1961, 54 y.o.   MRN: [id number]  HPI Depression- 'i'm feeling worse' since starting Wellbutrin.  Pt doesn't feel it's the medication that made it worse, just feels that the situation is worse.  Pt typed her sxs for me to read b/c she feared discussing them outloud due to crying.  sxs include:  'Can't focus, restless, crying, staying in bed, no energy, not cleaning (i love to clean), stress eating, HA, neck stiffness, anger, shame, fear, overwhelmed, no control, can't sleep, i'm so tired, forgetful, can't change jobs'  Pt's sxs 'are all triggered by my issue at work'.     Review of Systems For ROS see HPI     Objective:   Physical Exam  Constitutional: She is oriented to person, place, and time. She appears well-developed and well-nourished.  HENT:  Head: Normocephalic and atraumatic.  Neurological: She is alert and oriented to person, place, and time.  Skin: Skin is warm and dry.  Psychiatric: She has a normal mood and affect. Her behavior is normal. Thought content normal.  Vitals reviewed.         Assessment & Plan:

## 2014-11-09 NOTE — Progress Notes (Signed)
Pre visit review using our clinic review tool, if applicable. No additional management support is needed unless otherwise documented below in the visit note. 

## 2014-11-15 ENCOUNTER — Telehealth: Payer: Self-pay | Admitting: Family Medicine

## 2014-11-15 MED ORDER — ALPRAZOLAM 0.25 MG PO TABS: 0.2500 mg | ORAL_TABLET | Freq: Every evening | ORAL | Status: DC | PRN

## 2014-11-15 NOTE — Telephone Encounter (Signed)
Caller: Aleera Gilcrease, self Ph#: [phone] Pharmacy: [facility] in [place] on [address] Reason for call: Pt has 5 doses of ALPRAZolam (XANAX) 0.25 MG tablet. She states the pharmacy was calling us to notify pt needed refill. Please let her know if we can call in the refill for her. Thanks.

## 2014-11-15 NOTE — Telephone Encounter (Signed)
Last OV 11/09/14 Alprazolam last filled 09-25-14 #60 with 0

## 2014-11-15 NOTE — Telephone Encounter (Signed)
Med filled.  

## 2014-11-15 NOTE — Telephone Encounter (Signed)
Ok for #60, 1 refill 

## 2014-11-22 ENCOUNTER — Ambulatory Visit: Payer: No Typology Code available for payment source | Admitting: Family Medicine

## 2014-11-24 ENCOUNTER — Telehealth: Payer: Self-pay | Admitting: Family Medicine

## 2014-11-24 MED ORDER — BUPROPION HCL ER (XL) 300 MG PO TB24: 300.0000 mg | ORAL_TABLET | Freq: Every day | ORAL | Status: DC

## 2014-11-24 NOTE — Telephone Encounter (Signed)
Called pt and lmovm to inform.

## 2014-11-24 NOTE — Telephone Encounter (Signed)
Medication refilled again today. Will notify pt.

## 2014-11-24 NOTE — Telephone Encounter (Signed)
Caller name: Elham Lemberger Relationship to patient: self Can be reached: [phone] Pharmacy: [facility] in [place]  Reason for call: Pt went to pick up buPROPion (WELLBUTRIN XL) 300 MG 24 hr tablet that she was changed to at last appt. She states pharmacy told her they did not receive that order. Pt tried to refill the Wellbutrin 150 but insurance will not cover it. Pt is currently out of medication.
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
## 2014-11-28 ENCOUNTER — Other Ambulatory Visit (HOSPITAL_BASED_OUTPATIENT_CLINIC_OR_DEPARTMENT_OTHER): Payer: No Typology Code available for payment source

## 2014-11-28 ENCOUNTER — Ambulatory Visit (HOSPITAL_BASED_OUTPATIENT_CLINIC_OR_DEPARTMENT_OTHER): Payer: No Typology Code available for payment source | Admitting: Oncology

## 2014-11-28 VITALS — BP 109/67 | HR 81 | Temp 98.3°F | Resp 18 | Ht 62.0 in | Wt 136.4 lb

## 2014-11-28 DIAGNOSIS — C50212 Malignant neoplasm of upper-inner quadrant of left female breast: Secondary | ICD-10-CM

## 2014-11-28 DIAGNOSIS — C50219 Malignant neoplasm of upper-inner quadrant of unspecified female breast: Secondary | ICD-10-CM

## 2014-11-28 DIAGNOSIS — Z853 Personal history of malignant neoplasm of breast: Secondary | ICD-10-CM

## 2014-11-28 LAB — CBC WITH DIFFERENTIAL/PLATELET
BASO%: 0.3 % (ref 0.0–2.0)
Basophils Absolute: 0 10*3/uL (ref 0.0–0.1)
EOS%: 1.5 % (ref 0.0–7.0)
Eosinophils Absolute: 0.1 10*3/uL (ref 0.0–0.5)
HCT: 38.6 % (ref 34.8–46.6)
HEMOGLOBIN: 12.8 g/dL (ref 11.6–15.9)
LYMPH%: 36.5 % (ref 14.0–49.7)
MCH: 28.9 pg (ref 25.1–34.0)
MCHC: 33.2 g/dL (ref 31.5–36.0)
MCV: 87.1 fL (ref 79.5–101.0)
MONO#: 0.6 10*3/uL (ref 0.1–0.9)
MONO%: 8.5 % (ref 0.0–14.0)
NEUT#: 3.7 10*3/uL (ref 1.5–6.5)
NEUT%: 53.2 % (ref 38.4–76.8)
Platelets: 199 10*3/uL (ref 145–400)
RBC: 4.43 10*6/uL (ref 3.70–5.45)
RDW: 13.5 % (ref 11.2–14.5)
WBC: 6.9 10*3/uL (ref 3.9–10.3)
lymph#: 2.5 10*3/uL (ref 0.9–3.3)

## 2014-11-28 LAB — COMPREHENSIVE METABOLIC PANEL (CC13)
ALBUMIN: 4 g/dL (ref 3.5–5.0)
ALK PHOS: 91 U/L (ref 40–150)
ALT: 14 U/L (ref 0–55)
AST: 19 U/L (ref 5–34)
Anion Gap: 11 mEq/L (ref 3–11)
BILIRUBIN TOTAL: 0.23 mg/dL (ref 0.20–1.20)
BUN: 20.2 mg/dL (ref 7.0–26.0)
CO2: 26 meq/L (ref 22–29)
Calcium: 9.2 mg/dL (ref 8.4–10.4)
Chloride: 104 mEq/L (ref 98–109)
Creatinine: 0.7 mg/dL (ref 0.6–1.1)
GLUCOSE: 83 mg/dL (ref 70–140)
POTASSIUM: 4.7 meq/L (ref 3.5–5.1)
SODIUM: 141 meq/L (ref 136–145)
TOTAL PROTEIN: 7.1 g/dL (ref 6.4–8.3)

## 2014-11-30 ENCOUNTER — Ambulatory Visit (INDEPENDENT_AMBULATORY_CARE_PROVIDER_SITE_OTHER): Payer: No Typology Code available for payment source | Admitting: Family Medicine

## 2014-11-30 ENCOUNTER — Encounter: Payer: Self-pay | Admitting: Family Medicine

## 2014-11-30 VITALS — BP 107/68 | HR 78 | Temp 98.1°F | Ht 62.0 in | Wt 137.6 lb

## 2014-11-30 DIAGNOSIS — F329 Major depressive disorder, single episode, unspecified: Secondary | ICD-10-CM | POA: Diagnosis not present

## 2014-11-30 DIAGNOSIS — F32A Depression, unspecified: Secondary | ICD-10-CM

## 2014-11-30 NOTE — Progress Notes (Signed)
Pre visit review using our clinic review tool, if applicable. No additional management support is needed unless otherwise documented below in the visit note. 

## 2014-11-30 NOTE — Progress Notes (Signed)
   Subjective:    Patient ID: [name] [name], female    DOB: Jul 13, 1960, 54 y.o.   MRN: [id number]  HPI Depression- chronic problem, pt reports things are better since last visit.  Pt is debating whether she is going to continue w/ her complaint at work.  She is debating just 'dropping it and moving on' vs 'setting a good example for my daughter'.  Pt reports 'i'm doing things'.  Has started to exercise.  Increased water intake, changed diet.  Now on Wellbutrin 300mg .  Pt graduated from breast cancer treatment yesterday.   Review of Systems For ROS see HPI     Objective:   Physical Exam  Constitutional: She is oriented to person, place, and time. She appears well-developed and well-nourished. No distress.  HENT:  Head: Normocephalic and atraumatic.  Neurological: She is alert and oriented to person, place, and time.  Skin: Skin is warm and dry.  Psychiatric: She has a normal mood and affect. Her behavior is normal. Thought content normal.  Vitals reviewed.         Assessment & Plan:

## 2014-11-30 NOTE — Assessment & Plan Note (Signed)
Improved since last visit.  I suspect this is due to both the increased medication and pt's improved outlook on her current situation.  Encouraged her to speak w/ her daughter about continuing w/ her complaint as her biggest concern is disappointing her.  Pt agreeable to having this conversation.  Will continue to follow.

## 2014-11-30 NOTE — Patient Instructions (Signed)
Schedule your complete physical for September Keep up the good work!  You look great! Continue the Wellbutrin for now Call with any questions or concerns Hang in there!!! Enjoy your holiday weekend!!!

## 2014-12-12 ENCOUNTER — Telehealth: Payer: Self-pay | Admitting: Family Medicine

## 2014-12-12 MED ORDER — ALPRAZOLAM 0.25 MG PO TABS: 0.2500 mg | ORAL_TABLET | Freq: Two times a day (BID) | ORAL | Status: DC | PRN

## 2014-12-12 NOTE — Telephone Encounter (Signed)
Pt can resume if needed- no need to stop

## 2014-12-12 NOTE — Telephone Encounter (Signed)
Relation to pt: self  Call back number:[phone]    Reason for call:   Pt wanted to inform MD every since she stopped taking  Duanne Moron) on May 29th, pt is experecing anxiety and rapid heart rate. Please advise

## 2014-12-12 NOTE — Telephone Encounter (Signed)
No documentation that pt talked to provider about stopping her alprazolam, last OV provider talked about mood being better since increasing wellbutrin  Records show alprazolam was last filled 11-15-14 #30 with 1

## 2014-12-12 NOTE — Telephone Encounter (Signed)
Pt returned your call, pt states she will keep phone in hand.

## 2014-12-12 NOTE — Telephone Encounter (Signed)
Med filled BID PRN, pt notified. Also advised to contact office if symptoms persist.

## 2014-12-12 NOTE — Telephone Encounter (Signed)
We can send in a prescription for twice daily and hopefully the change in sig will allow insurance to fill.  Otherwise, she can buy a few out of pocket to get her to the fill date of 6/10

## 2014-12-12 NOTE — Telephone Encounter (Signed)
called pt and LMOVM to return call.

## 2014-12-12 NOTE — Telephone Encounter (Signed)
Spoke with pt who advised that she was trying to wean herself off of the medication. Pt states that she is currently out of alprazolam (due to taking it more than 1 time a day before she quit). Pt states pharmacy says she cannot have medication filled until 12-15-14. Please advise on what we can do.

## 2015-01-11 ENCOUNTER — Other Ambulatory Visit: Payer: Self-pay | Admitting: Family Medicine

## 2015-01-11 NOTE — Telephone Encounter (Signed)
Last ov 11-30-14 Alprazolam last filled 12-12-14 #60 with 0  CSC, low risk, due for UDS

## 2015-01-12 NOTE — Telephone Encounter (Signed)
Med filled and faxed.  

## 2015-03-17 ENCOUNTER — Other Ambulatory Visit: Payer: Self-pay | Admitting: Family Medicine

## 2015-03-19 NOTE — Telephone Encounter (Signed)
Medication filled to pharmacy as requested.   

## 2015-03-20 ENCOUNTER — Other Ambulatory Visit: Payer: Self-pay | Admitting: Family Medicine

## 2015-03-21 NOTE — Telephone Encounter (Signed)
Last Ov 11/30/14 Alprazolam last filled 01/12/15 #60 with 1

## 2015-03-21 NOTE — Telephone Encounter (Signed)
Medication filled to pharmacy as requested.   

## 2015-04-23 ENCOUNTER — Other Ambulatory Visit: Payer: Self-pay | Admitting: General Practice

## 2015-04-23 MED ORDER — ALPRAZOLAM 0.25 MG PO TABS: 0.2500 mg | ORAL_TABLET | Freq: Two times a day (BID) | ORAL | Status: DC | PRN

## 2015-04-23 NOTE — Telephone Encounter (Signed)
Medication filled to pharmacy as requested.   

## 2015-04-23 NOTE — Telephone Encounter (Signed)
Last OV 11-30-14 Alprazolam last filled 03-21-15 #60 with 0

## 2015-04-26 ENCOUNTER — Encounter: Payer: No Typology Code available for payment source | Admitting: Family Medicine

## 2015-04-27 ENCOUNTER — Encounter: Payer: Self-pay | Admitting: Gastroenterology

## 2015-05-23 ENCOUNTER — Encounter: Payer: Self-pay | Admitting: Gastroenterology

## 2015-05-29 ENCOUNTER — Telehealth: Payer: Self-pay

## 2015-05-29 NOTE — Telephone Encounter (Signed)
Pre-Call visit completed.

## 2015-05-30 ENCOUNTER — Encounter: Payer: Self-pay | Admitting: Family Medicine

## 2015-05-30 ENCOUNTER — Ambulatory Visit (INDEPENDENT_AMBULATORY_CARE_PROVIDER_SITE_OTHER): Payer: No Typology Code available for payment source | Admitting: Family Medicine

## 2015-05-30 VITALS — BP 104/70 | HR 83 | Temp 98.0°F | Resp 16 | Ht 62.0 in | Wt 149.2 lb

## 2015-05-30 DIAGNOSIS — Z0000 Encounter for general adult medical examination without abnormal findings: Secondary | ICD-10-CM | POA: Diagnosis not present

## 2015-05-30 DIAGNOSIS — R209 Unspecified disturbances of skin sensation: Secondary | ICD-10-CM | POA: Diagnosis not present

## 2015-05-30 DIAGNOSIS — M8580 Other specified disorders of bone density and structure, unspecified site: Secondary | ICD-10-CM

## 2015-05-30 DIAGNOSIS — M6530 Trigger finger, unspecified finger: Secondary | ICD-10-CM

## 2015-05-30 LAB — BASIC METABOLIC PANEL
BUN: 15 mg/dL (ref 6–23)
CALCIUM: 9.8 mg/dL (ref 8.4–10.5)
CO2: 31 mEq/L (ref 19–32)
Chloride: 104 mEq/L (ref 96–112)
Creatinine, Ser: 0.58 mg/dL (ref 0.40–1.20)
GFR: 114.01 mL/min (ref >60.00)
GLUCOSE: 88 mg/dL (ref 70–99)
Potassium: 4.4 mEq/L (ref 3.5–5.1)
SODIUM: 141 meq/L (ref 135–145)

## 2015-05-30 LAB — LIPID PANEL
CHOLESTEROL: 186 mg/dL (ref 0–200)
HDL: 67.4 mg/dL (ref >39.00)
LDL CALC: 97 mg/dL (ref 0–99)
NonHDL: 118.16
Total CHOL/HDL Ratio: 3
Triglycerides: 107 mg/dL (ref 0.0–149.0)
VLDL: 21.4 mg/dL (ref 0.0–40.0)

## 2015-05-30 LAB — CBC WITH DIFFERENTIAL/PLATELET
Basophils Absolute: 0 10*3/uL (ref 0.0–0.1)
Basophils Relative: 0.3 % (ref 0.0–3.0)
Eosinophils Absolute: 0.1 10*3/uL (ref 0.0–0.7)
Eosinophils Relative: 1.1 % (ref 0.0–5.0)
HCT: 38.8 % (ref 36.0–46.0)
Hemoglobin: 12.6 g/dL (ref 12.0–15.0)
LYMPHS ABS: 2.4 10*3/uL (ref 0.7–4.0)
Lymphocytes Relative: 31.2 % (ref 12.0–46.0)
MCHC: 32.5 g/dL (ref 30.0–36.0)
MCV: 88 fl (ref 78.0–100.0)
MONOS PCT: 8.2 % (ref 3.0–12.0)
Monocytes Absolute: 0.6 10*3/uL (ref 0.1–1.0)
NEUTROS PCT: 59.2 % (ref 43.0–77.0)
Neutro Abs: 4.6 10*3/uL (ref 1.4–7.7)
Platelets: 236 10*3/uL (ref 150.0–400.0)
RBC: 4.41 Mil/uL (ref 3.87–5.11)
RDW: 13.8 % (ref 11.5–15.5)
WBC: 7.8 10*3/uL (ref 4.0–10.5)

## 2015-05-30 LAB — HEPATIC FUNCTION PANEL
ALBUMIN: 4.2 g/dL (ref 3.5–5.2)
ALT: 21 U/L (ref 0–35)
AST: 19 U/L (ref 0–37)
Alkaline Phosphatase: 103 U/L (ref 39–117)
Bilirubin, Direct: 0 mg/dL (ref 0.0–0.3)
Total Bilirubin: 0.2 mg/dL (ref 0.2–1.2)
Total Protein: 7.3 g/dL (ref 6.0–8.3)

## 2015-05-30 LAB — VITAMIN B12: VITAMIN B 12: 481 pg/mL (ref 211–911)

## 2015-05-30 LAB — VITAMIN D 25 HYDROXY (VIT D DEFICIENCY, FRACTURES): VITD: 50.47 ng/mL (ref 30.00–100.00)

## 2015-05-30 MED ORDER — BUPROPION HCL ER (XL) 150 MG PO TB24
150.0000 mg | ORAL_TABLET | Freq: Every day | ORAL | Status: DC
Start: 2015-05-30 — End: 2016-02-12

## 2015-05-30 NOTE — Progress Notes (Signed)
   Subjective:    Patient ID: [name] [name], female    DOB: Jan 10, 1961, 54 y.o.   MRN: [id number]  HPI CPE- UTD on pap, mammo, colonoscopy (scheduled for recall on 07/25/15).  Due for DEXA ([facility]).  R middle finger- triggering. L foot cold- sensation of being cold, not cold to touch.  Pt has hx of B12 deficiency.   Review of Systems Patient reports no vision/ hearing changes, adenopathy,fever, weight change,  persistant/recurrent hoarseness , swallowing issues, chest pain, palpitations, edema, persistant/recurrent cough, hemoptysis, dyspnea (rest/exertional/paroxysmal nocturnal), gastrointestinal bleeding (melena, rectal bleeding), abdominal pain, significant heartburn, bowel changes, GU symptoms (dysuria, hematuria, incontinence), Gyn symptoms (abnormal  bleeding, pain),  syncope, focal weakness, memory loss, numbness & tingling, skin/hair/nail changes, abnormal bruising or bleeding, anxiety, or depression.     Objective:   Physical Exam General Appearance:    Alert, cooperative, no distress, appears stated age  Head:    Normocephalic, without obvious abnormality, atraumatic  Eyes:    PERRL, conjunctiva/corneas clear, EOM's intact, fundi    benign, both eyes  Ears:    Normal TM's and external ear canals, both ears  Nose:   Nares normal, septum midline, mucosa normal, no drainage    or sinus tenderness  Throat:   Lips, mucosa, and tongue normal; teeth and gums normal  Neck:   Supple, symmetrical, trachea midline, no adenopathy;    Thyroid: no enlargement/tenderness/nodules  Back:     Symmetric, no curvature, ROM normal, no CVA tenderness  Lungs:     Clear to auscultation bilaterally, respirations unlabored  Chest Wall:    No tenderness or deformity   Heart:    Regular rate and rhythm, S1 and S2 normal, no murmur, rub   or gallop  Breast Exam:    Deferred to mammo/onc  Abdomen:     Soft, non-tender, bowel sounds active all four quadrants,    no masses, no organomegaly    Genitalia:    Deferred  Rectal:    Extremities:   Extremities normal, atraumatic, no cyanosis or edema  Pulses:   2+ and symmetric all extremities  Skin:   Skin color, texture, turgor normal, no rashes or lesions  Lymph nodes:   Cervical, supraclavicular, and axillary nodes normal  Neurologic:   CNII-XII intact, normal strength, sensation and reflexes    throughout          Assessment & Plan:

## 2015-05-30 NOTE — Assessment & Plan Note (Signed)
Chronic problem.  Due for repeat DEXA.  Check Vit D level.  Replete prn.

## 2015-05-30 NOTE — Patient Instructions (Addendum)
Follow up in 2 months to recheck anxiety/depression We'll notify you of your lab results and make any changes if needed Keep up the good work!  You look great! Decrease the Wellbutrin to 150mg  daily- new prescription sent Check downstairs and see if they will do your bone density We'll call you with your hand appt for the trigger finger Call with any questions or concerns If you want to join [place] at the new [place] office, any scheduled appointments will automatically transfer and we will see you at [address] [place] [address] [place], [place] [address] (OPENING 07/10/15) Happy Holidays!!!

## 2015-05-30 NOTE — Progress Notes (Signed)
Pre visit review using our clinic review tool, if applicable. No additional management support is needed unless otherwise documented below in the visit note. 

## 2015-05-30 NOTE — Assessment & Plan Note (Signed)
Pt's PE WNL.  UTD on pap, mammo, colonoscopy.  Already had flu shot.  Check labs.  Anticipatory guidance provided.

## 2015-06-05 ENCOUNTER — Telehealth: Payer: Self-pay | Admitting: Family Medicine

## 2015-06-05 DIAGNOSIS — G629 Polyneuropathy, unspecified: Secondary | ICD-10-CM

## 2015-06-05 NOTE — Telephone Encounter (Signed)
Caller name: Self   Can be reached: [phone]    Reason for call: Patient wants to know what the next step is for her treatment. States she was told by Dr T. That she had a B-12 deficiency.

## 2015-06-06 ENCOUNTER — Telehealth: Payer: Self-pay | Admitting: Family Medicine

## 2015-06-06 NOTE — Telephone Encounter (Signed)
Pt's recent labs looked great!  There is no B12 deficiency to treat.

## 2015-06-06 NOTE — Telephone Encounter (Signed)
Referral placed.

## 2015-06-06 NOTE — Telephone Encounter (Signed)
Please advise, labs do not support this statement. If anything pt has been elevated in the past. Last Labs on 11/23 pt level was 481

## 2015-06-06 NOTE — Telephone Encounter (Signed)
Called pt and LMOVM to inform on PCP recommendations. Waiting on pt to call back in regards to referral

## 2015-06-06 NOTE — Telephone Encounter (Signed)
Called and spoke with pt, she stated that her concern was the cold and numb feeling that she has been having. We were trying to rule out B12 deficiency. Pt has Neuropathy listed on her problem list from 2011. Pt states she has never been seen by neuro, would that be next step?

## 2015-06-06 NOTE — Telephone Encounter (Signed)
Next step would be neuro referral if she is interested.  No B12 deficiency- so this is not the cause

## 2015-06-06 NOTE — Telephone Encounter (Signed)
Relation to [id number] Call back number:[phone]   Reason for call:  Patient requesting a referral to neuro

## 2015-06-06 NOTE — Addendum Note (Signed)
Addended by: Davis Gourd on: 06/06/2015 04:19 PM   Modules accepted: Orders

## 2015-06-08 ENCOUNTER — Ambulatory Visit (HOSPITAL_BASED_OUTPATIENT_CLINIC_OR_DEPARTMENT_OTHER)
Admission: RE | Admit: 2015-06-08 | Discharge: 2015-06-08 | Disposition: A | Discharge status: Home / Self Care | Payer: No Typology Code available for payment source | Source: Ambulatory Visit | Attending: Family Medicine | Admitting: Family Medicine

## 2015-06-08 DIAGNOSIS — Z780 Asymptomatic menopausal state: Secondary | ICD-10-CM | POA: Insufficient documentation

## 2015-06-08 DIAGNOSIS — Z87891 Personal history of nicotine dependence: Secondary | ICD-10-CM | POA: Insufficient documentation

## 2015-06-08 DIAGNOSIS — M8580 Other specified disorders of bone density and structure, unspecified site: Secondary | ICD-10-CM | POA: Diagnosis not present

## 2015-06-08 DIAGNOSIS — E0500 Thyrotoxicosis with diffuse goiter without thyrotoxic crisis or storm: Secondary | ICD-10-CM | POA: Insufficient documentation

## 2015-06-27 ENCOUNTER — Other Ambulatory Visit: Payer: Self-pay | Admitting: Family Medicine

## 2015-06-27 NOTE — Telephone Encounter (Signed)
Last OV 05-30-15 Alprazolam last filled 04-23-15 #60 with 1

## 2015-06-27 NOTE — Telephone Encounter (Signed)
Medication filled to pharmacy as requested.   

## 2015-06-29 ENCOUNTER — Other Ambulatory Visit: Payer: Self-pay | Admitting: Family Medicine

## 2015-06-29 NOTE — Telephone Encounter (Signed)
Med denied, filled on 06/27/15

## 2015-07-06 ENCOUNTER — Encounter: Payer: Self-pay | Admitting: Neurology

## 2015-07-06 ENCOUNTER — Other Ambulatory Visit (INDEPENDENT_AMBULATORY_CARE_PROVIDER_SITE_OTHER): Payer: No Typology Code available for payment source

## 2015-07-06 ENCOUNTER — Ambulatory Visit (INDEPENDENT_AMBULATORY_CARE_PROVIDER_SITE_OTHER): Payer: No Typology Code available for payment source | Admitting: Neurology

## 2015-07-06 VITALS — BP 108/70 | HR 93 | Ht 62.0 in | Wt 153.4 lb

## 2015-07-06 DIAGNOSIS — R200 Anesthesia of skin: Secondary | ICD-10-CM

## 2015-07-06 DIAGNOSIS — R208 Other disturbances of skin sensation: Secondary | ICD-10-CM

## 2015-07-06 NOTE — Patient Instructions (Signed)
1.  To look for common causes of neuropathy, will check ANA, Sed Rate, SPEP/IFE, B12, B6, TSH 2.  Will get nerve conduction study to evaluate for generalized neuropathy and/or left lower extremity radiculopathy 3.  Follow up after testing
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
## 2015-07-07 LAB — SEDIMENTATION RATE: Sed Rate: 10 mm/hr (ref 0–30)

## 2015-07-07 LAB — TSH: TSH: 7.308 u[IU]/mL — ABNORMAL HIGH (ref 0.350–4.500)

## 2015-07-07 LAB — VITAMIN B12: VITAMIN B 12: 640 pg/mL (ref 211–911)

## 2015-07-10 LAB — ANA: Anti Nuclear Antibody(ANA): NEGATIVE

## 2015-07-11 LAB — SPEP & IFE WITH QIG
ALBUMIN ELP: 4.1 g/dL (ref 3.8–4.8)
ALPHA-1-GLOBULIN: 0.3 g/dL (ref 0.2–0.3)
Alpha-2-Globulin: 0.8 g/dL (ref 0.5–0.9)
BETA 2: 0.4 g/dL (ref 0.2–0.5)
Beta Globulin: 0.5 g/dL (ref 0.4–0.6)
Gamma Globulin: 0.9 g/dL (ref 0.8–1.7)
IGG (IMMUNOGLOBIN G), SERUM: 940 mg/dL (ref 690–1700)
IgA: 132 mg/dL (ref 69–380)
IgM, Serum: 93 mg/dL (ref 52–322)
TOTAL PROTEIN, SERUM ELECTROPHOR: 7 g/dL (ref 6.1–8.1)

## 2015-07-12 ENCOUNTER — Telehealth: Payer: Self-pay

## 2015-07-12 DIAGNOSIS — E039 Hypothyroidism, unspecified: Secondary | ICD-10-CM

## 2015-07-12 LAB — VITAMIN B6: Vitamin B6: 40.9 ng/mL — ABNORMAL HIGH (ref 2.1–21.7)

## 2015-07-12 NOTE — Telephone Encounter (Signed)
Message relayed to patient. Verbalized understanding. Will stop by tomorrow to have labs drawn. Must go to [facility] for insurance reasons. Orders reflect that.

## 2015-07-12 NOTE — Telephone Encounter (Signed)
-----   Message from Pieter Partridge, DO sent at 07/12/2015 12:21 PM EST ----- Overall, labs are unrevealing.  B6 is mildly elevated, but not high enough that would typically cause numbness.  TSH is elevated, but she has known hypothyroidism.  I would like to check a free T3 and T4 to verify that she is not currently having active hypothyroidism

## 2015-07-12 NOTE — Telephone Encounter (Signed)
Left message on machine for pt to return call to the office.  

## 2015-07-12 NOTE — Telephone Encounter (Signed)
VM-PT called and left a message saying she was returning a call/Dawn CB# [phone]

## 2015-07-13 ENCOUNTER — Other Ambulatory Visit: Payer: Self-pay | Admitting: Neurology

## 2015-07-13 ENCOUNTER — Other Ambulatory Visit: Payer: Self-pay | Admitting: Family Medicine

## 2015-07-13 ENCOUNTER — Other Ambulatory Visit: Payer: PRIVATE HEALTH INSURANCE

## 2015-07-13 ENCOUNTER — Ambulatory Visit (AMBULATORY_SURGERY_CENTER): Payer: Self-pay

## 2015-07-13 VITALS — Ht 62.0 in | Wt 152.6 lb

## 2015-07-13 DIAGNOSIS — E039 Hypothyroidism, unspecified: Secondary | ICD-10-CM

## 2015-07-13 DIAGNOSIS — Z800 Family history of malignant neoplasm of digestive organs: Secondary | ICD-10-CM

## 2015-07-13 DIAGNOSIS — Z86010 Personal history of colonic polyps: Secondary | ICD-10-CM

## 2015-07-13 MED ORDER — NA SULFATE-K SULFATE-MG SULF 17.5-3.13-1.6 GM/177ML PO SOLN: ORAL | Status: DC

## 2015-07-13 NOTE — Progress Notes (Signed)
Per pt, no allergies to soy or egg products.Pt not taking any weight loss meds or using  O2 at home. 

## 2015-07-14 LAB — T3, FREE: T3 FREE: 2.7 pg/mL (ref 2.3–4.2)

## 2015-07-14 LAB — T4, FREE: FREE T4: 1.11 ng/dL (ref 0.80–1.80)

## 2015-07-18 ENCOUNTER — Telehealth: Payer: Self-pay | Admitting: Neurology

## 2015-07-18 NOTE — Telephone Encounter (Signed)
PT would like a call back in regards to some lab work/Dawn CB# [phone]

## 2015-07-18 NOTE — Telephone Encounter (Signed)
[facility] patient.  

## 2015-07-18 NOTE — Telephone Encounter (Signed)
Attempted to reach pt. VM left for her to return call.

## 2015-07-19 ENCOUNTER — Telehealth: Payer: Self-pay | Admitting: Neurology

## 2015-07-19 NOTE — Telephone Encounter (Signed)
Thyroid hormones (free T3 2.7 and free T4 1.11) are normal.

## 2015-07-19 NOTE — Telephone Encounter (Signed)
Message relayed to patient. Verbalized understanding and denied questions.   

## 2015-07-25 ENCOUNTER — Encounter: Payer: Self-pay | Admitting: Gastroenterology

## 2015-07-25 ENCOUNTER — Ambulatory Visit (AMBULATORY_SURGERY_CENTER): Payer: No Typology Code available for payment source | Admitting: Gastroenterology

## 2015-07-25 ENCOUNTER — Other Ambulatory Visit: Payer: Self-pay | Admitting: Family Medicine

## 2015-07-25 VITALS — BP 101/67 | HR 75 | Temp 97.1°F | Resp 18 | Ht 62.0 in | Wt 152.0 lb

## 2015-07-25 DIAGNOSIS — Z86010 Personal history of colonic polyps: Secondary | ICD-10-CM | POA: Diagnosis not present

## 2015-07-25 DIAGNOSIS — Z800 Family history of malignant neoplasm of digestive organs: Secondary | ICD-10-CM | POA: Diagnosis not present

## 2015-07-25 MED ORDER — SODIUM CHLORIDE 0.9 % IV SOLN: 500.0000 mL | INTRAVENOUS | Status: DC

## 2015-07-25 NOTE — Patient Instructions (Signed)
YOU HAD AN ENDOSCOPIC PROCEDURE TODAY AT [facility]:   Refer to the procedure report that was given to you for any specific questions about what was found during the examination.  If the procedure report does not answer your questions, please call your gastroenterologist to clarify.  If you requested that your care partner not be given the details of your procedure findings, then the procedure report has been included in a sealed envelope for you to review at your convenience later.  YOU SHOULD EXPECT: Some feelings of bloating in the abdomen. Passage of more gas than usual.  Walking can help get rid of the air that was put into your GI tract during the procedure and reduce the bloating. If you had a lower endoscopy (such as a colonoscopy or flexible sigmoidoscopy) you may notice spotting of blood in your stool or on the toilet paper. If you underwent a bowel prep for your procedure, you may not have a normal bowel movement for a few days.  Please Note:  You might notice some irritation and congestion in your nose or some drainage.  This is from the oxygen used during your procedure.  There is no need for concern and it should clear up in a day or so.  SYMPTOMS TO REPORT IMMEDIATELY:   Following lower endoscopy (colonoscopy or flexible sigmoidoscopy):  Excessive amounts of blood in the stool  Significant tenderness or worsening of abdominal pains  Swelling of the abdomen that is new, acute  Fever of 100F or higher   For urgent or emergent issues, a gastroenterologist can be reached at any hour by calling [phone].   DIET: Your first meal following the procedure should be a small meal and then it is ok to progress to your normal diet. Heavy or fried foods are harder to digest and may make you feel nauseous or bloated.  Likewise, meals heavy in dairy and vegetables can increase bloating.  Drink plenty of fluids but you should avoid alcoholic beverages for 24  hours.  ACTIVITY:  You should plan to take it easy for the rest of today and you should NOT DRIVE or use heavy machinery until tomorrow (because of the sedation medicines used during the test).    FOLLOW UP: Our staff will call the number listed on your records the next business day following your procedure to check on you and address any questions or concerns that you may have regarding the information given to you following your procedure. If we do not reach you, we will leave a message.  However, if you are feeling well and you are not experiencing any problems, there is no need to return our call.  We will assume that you have returned to your regular daily activities without incident.  If any biopsies were taken you will be contacted by phone or by letter within the next 1-3 weeks.  Please call us at [phone] if you have not heard about the biopsies in 3 weeks.    SIGNATURES/CONFIDENTIALITY: You and/or your care partner have signed paperwork which will be entered into your electronic medical record.  These signatures attest to the fact that that the information above on your After Visit Summary has been reviewed and is understood.  Full responsibility of the confidentiality of this discharge information lies with you and/or your care-partner.  Please review diverticulosis and high fiber diet handouts provided. Next colonoscopy in 5 years.

## 2015-07-25 NOTE — Progress Notes (Signed)
To recovery, report to Myers, RN, VSS. 

## 2015-07-25 NOTE — Op Note (Signed)
[facility]  [address] [place], [phone]   COLONOSCOPY PROCEDURE REPORT  PATIENT: [name] [name]  MR#: [id number] BIRTHDATE: [birth year] , 54  yrs. old GENDER: female ENDOSCOPIST: Milus Banister, MD PROCEDURE DATE:  07/25/2015 PROCEDURE:   Colonoscopy, surveillance First Screening Colonoscopy - Avg.  risk and is 50 yrs.  old or older - No.  Prior Negative Screening - Now for repeat screening. N/A  History of Adenoma - Now for follow-up colonoscopy & has been > or = to 3 yrs.  Yes hx of adenoma.  Has been 3 or more years since last colonoscopy.  high risk ASA CLASS:   Class II INDICATIONS:47mm TA removed 2011, also father diagnosed with colon cancer in his 28s. MEDICATIONS: Monitored anesthesia care and Propofol 200 mg IV  DESCRIPTION OF PROCEDURE:   After the risks benefits and alternatives of the procedure were thoroughly explained, informed consent was obtained.  The digital rectal exam revealed no abnormalities of the rectum.   The LB [id number]  endoscope was introduced through the anus and advanced to the cecum, which was identified by both the appendix and ileocecal valve. No adverse events experienced.   The quality of the prep was excellent.  The instrument was then slowly withdrawn as the colon was fully examined. Estimated blood loss is zero unless otherwise noted in this procedure report.   COLON FINDINGS: There was mild diverticulosis noted in the left colon.   The examination was otherwise normal.  Retroflexed views revealed no abnormalities. The time to cecum = 2.1 Withdrawal time = 9.5   The scope was withdrawn and the procedure completed. COMPLICATIONS: There were no immediate complications.  ENDOSCOPIC IMPRESSION: 1.   Mild diverticulosis was noted in the left colon 2.   The examination was otherwise normal  RECOMMENDATIONS: Given your significant family history of colon cancer (father diagnosed in his 56s), you should have  a repeat colonoscopy in 5 years  eSigned:  Milus Banister, MD 07/25/2015 9:03 AM

## 2015-07-26 ENCOUNTER — Telehealth: Payer: Self-pay

## 2015-07-26 NOTE — Telephone Encounter (Signed)
  Follow up Call-  Call back number 07/25/2015  Post procedure Call Back phone  # [phone]  Permission to leave phone message Yes     Patient questions:  Do you have a fever, pain , or abdominal swelling? No. Pain Score  0 *  Have you tolerated food without any problems? Yes.    Have you been able to return to your normal activities? Yes.    Do you have any questions about your discharge instructions: Diet   No. Medications  No. Follow up visit  No.  Do you have questions or concerns about your Care? No.  Actions: * If pain score is 4 or above: No action needed, pain <4.

## 2015-07-27 ENCOUNTER — Other Ambulatory Visit: Payer: Self-pay | Admitting: Family Medicine

## 2015-07-27 NOTE — Telephone Encounter (Signed)
Last OV: 05/30/2015 Last filled: 06/27/2015, #60, 0 RF Sig: TAKE 1 TABLET BY MOUTH TWICE DAILY AS NEEDED FOR ANXIETY UDS: 03/21/2013

## 2015-07-31 ENCOUNTER — Encounter: Payer: No Typology Code available for payment source | Admitting: Neurology

## 2015-08-01 ENCOUNTER — Ambulatory Visit (INDEPENDENT_AMBULATORY_CARE_PROVIDER_SITE_OTHER): Payer: No Typology Code available for payment source | Admitting: Family Medicine

## 2015-08-01 ENCOUNTER — Encounter: Payer: Self-pay | Admitting: Family Medicine

## 2015-08-01 VITALS — BP 114/70 | HR 90 | Temp 98.0°F | Ht 62.0 in | Wt 155.2 lb

## 2015-08-01 DIAGNOSIS — F32A Depression, unspecified: Secondary | ICD-10-CM

## 2015-08-01 DIAGNOSIS — F329 Major depressive disorder, single episode, unspecified: Secondary | ICD-10-CM

## 2015-08-01 DIAGNOSIS — E038 Other specified hypothyroidism: Secondary | ICD-10-CM | POA: Diagnosis not present

## 2015-08-01 LAB — TSH: TSH: 6.902 u[IU]/mL — ABNORMAL HIGH (ref 0.350–4.500)

## 2015-08-01 LAB — T4, FREE: FREE T4: 1.15 ng/dL (ref 0.80–1.80)

## 2015-08-01 LAB — T3, FREE: T3 FREE: 3.1 pg/mL (ref 2.3–4.2)

## 2015-08-01 NOTE — Progress Notes (Signed)
Pre visit review using our clinic review tool, if applicable. No additional management support is needed unless otherwise documented below in the visit note. 

## 2015-08-01 NOTE — Patient Instructions (Signed)
Follow up as needed We'll notify you of your lab results and make any changes if needed No changes in Wellbutrin at this time Continue to work on healthy diet and regular exercise- you can do it! Call with any questions or concerns Hang in there!!!

## 2015-08-01 NOTE — Assessment & Plan Note (Signed)
Chronic problem.  Following w/ Dr Mare Ferrari.  Pt had abnormal TSH done at neuro last month.  Will repeat TSH and add free T3/T4.  Will forward results to Endo for medication adjustments if needed.

## 2015-08-01 NOTE — Progress Notes (Signed)
   Subjective:    Patient ID: [name] [name], female    DOB: 1961-05-16, 55 y.o.   MRN: [id number]  HPI Depression- pt reports depression is doing well since decreasing the Wellbutrin.  Pt reports feeling well.  Not interested in stopping meds completely at this time.  Hypothyroid- pt's TSH on 12/30 was elevated at 7.  Pt reports increased fatigue and weight gain despite taking Levothyroxine 24mcg.  Pt is seeing Dr Mare Ferrari (Endo)   Review of Systems For ROS see HPI     Objective:   Physical Exam  Constitutional: She is oriented to person, place, and time. She appears well-developed and well-nourished. No distress.  HENT:  Head: Normocephalic and atraumatic.  Eyes: Conjunctivae and EOM are normal. Pupils are equal, round, and reactive to light.  Neurological: She is alert and oriented to person, place, and time.  Skin: Skin is warm and dry.  Psychiatric: She has a normal mood and affect. Her behavior is normal. Thought content normal.  Vitals reviewed.         Assessment & Plan:

## 2015-08-01 NOTE — Assessment & Plan Note (Signed)
Doing well at this time.  No med changes.  Will continue to follow.

## 2015-08-03 ENCOUNTER — Telehealth: Payer: Self-pay | Admitting: Neurology

## 2015-08-03 NOTE — Telephone Encounter (Signed)
Mailed patient records.

## 2015-08-07 ENCOUNTER — Encounter: Payer: PRIVATE HEALTH INSURANCE | Admitting: Neurology

## 2015-08-09 ENCOUNTER — Encounter: Payer: Self-pay | Admitting: Neurology

## 2015-08-22 ENCOUNTER — Other Ambulatory Visit: Payer: Self-pay | Admitting: Family Medicine

## 2015-08-22 NOTE — Telephone Encounter (Signed)
Medication filled to pharmacy as requested.   

## 2015-08-22 NOTE — Telephone Encounter (Signed)
Last OV 08/01/15 Alprazolam last filled 07/27/15 #60 with 0

## 2015-08-23 ENCOUNTER — Other Ambulatory Visit: Payer: Self-pay | Admitting: Family Medicine

## 2015-08-23 MED ORDER — POLYETHYLENE GLYCOL 3350 17 GM/SCOOP PO POWD: 1.0000 g | Freq: Every day | ORAL | Status: DC

## 2015-08-23 NOTE — Telephone Encounter (Signed)
Medication filled to pharmacy as requested.   

## 2015-10-03 ENCOUNTER — Telehealth: Payer: Self-pay | Admitting: Family Medicine

## 2015-10-03 NOTE — Telephone Encounter (Signed)
She will need to decrease medication to every other day x2 weeks and then she can stop entirely

## 2015-10-03 NOTE — Telephone Encounter (Signed)
Called pt and advised. She stated an understanding.

## 2015-10-03 NOTE — Telephone Encounter (Signed)
Last OV 08/01/15, please advise if ok?

## 2015-10-03 NOTE — Telephone Encounter (Signed)
Relationship to patient: Self   Can be reached: [phone]   Reason for call: pt says on her last appt she and the PCP discussed stopping buPROPion medication. Pt says that she is doing well and is ready to stop taking medication. She would like a call back to discuss further.

## 2015-11-06 ENCOUNTER — Other Ambulatory Visit: Payer: Self-pay | Admitting: Family Medicine

## 2015-11-07 NOTE — Telephone Encounter (Signed)
Medication filled to pharmacy as requested.   

## 2015-12-11 LAB — TSH: TSH: 1.28 u[IU]/mL (ref 0.41–5.90)

## 2015-12-17 ENCOUNTER — Other Ambulatory Visit: Payer: Self-pay | Admitting: Family Medicine

## 2015-12-17 NOTE — Telephone Encounter (Signed)
Last OV 08/01/15 Alprazolam last filled 08/22/15 #60 with 3

## 2015-12-17 NOTE — Telephone Encounter (Signed)
Medication filled to pharmacy as requested.   

## 2015-12-20 ENCOUNTER — Encounter: Payer: Self-pay | Admitting: General Practice

## 2016-01-11 ENCOUNTER — Telehealth: Payer: Self-pay | Admitting: General Practice

## 2016-01-11 NOTE — Telephone Encounter (Signed)
[facility] Patient Name: [name] [name] Gender: Female DOB: 1960-09-30 Age: 55 Y 3 M 25 D Return Phone Number: [phone] (Primary) Address: City/State/Zip: [place] Client Agency Client Site [facility] - Night Physician Dimple Nanas - MD Contact Type Call Who Is Calling Patient / Member / Family / Caregiver Call Type Triage / Clinical Relationship To Patient Self Return Phone Number [phone] (Primary) Chief Complaint Heart palpitations or irregular heartbeat Reason for Call Symptomatic / Request for Health Information Initial Comment Caller has dizziness and her heart is racing and needs to make an appt. [facility] PreDisposition Did not know what to do Translation No Nurse Assessment Nurse: Denyse Amass, RN, Benjamine Mola Date/Time (Eastern Time): 01/10/2016 4:26:14 PM Confirm and document reason for call. If symptomatic, describe symptoms. You must click the next button to save text entered. ---Patient states she had dizziness and she feels nausea and her heart feels like it is racing. States she felt the same thing last week. Has the patient traveled out of the country within the last 30 days? ---Not Applicable Does the patient have any new or worsening symptoms? ---Yes Will a triage be completed? ---Yes Related visit to physician within the last 2 weeks? ---No Does the PT have any chronic conditions? (i.e. diabetes, asthma, etc.) ---Yes List chronic conditions. ---Hypothyroidism Anxiety Seasonal Allergies Is this a behavioral health or substance abuse call? ---No Guidelines Guideline Title Affirmed Question Affirmed Notes Nurse Date/Time Eilene Ghazi Time) Dizziness - Lightheadedness Difficulty breathing [place], RN, Benjamine Mola 01/10/2016 4:29:52 PM Disp.  Time Eilene Ghazi Time) Disposition Final User 01/10/2016 4:34:08 PM Go to ED Now Yes [place], RN, Benjamine Mola PLEASE NOTE: All timestamps contained within this report are represented as [place] Standard Time. CONFIDENTIALTY NOTICE: This fax transmission is intended only for the addressee. It contains information that is legally privileged, confidential or otherwise protected from use or disclosure. If you are not the intended recipient, you are strictly prohibited from reviewing, disclosing, copying using or disseminating any of this information or taking any action in reliance on or regarding this information. If you have received this fax in error, please notify us immediately by telephone so that we can arrange for its return to [place]. Phone: [phone], Toll-Free: [phone], Fax: [phone] Page: 2 of 2 Call Id: [id number] Caller Understands: Yes Disagree/Comply: Comply Care Advice Given Per Guideline GO TO ED NOW: You need to be seen in the Emergency Department. Go to the ER at ___________ [place] now. Drive carefully. NOTE TO TRIAGER - DRIVING: * Another adult should drive. * If immediate transportation is not available via car or taxi, then the patient should be instructed to call [id number]. BRING MEDICINES: * Please bring a list of your current medicines when you go to the Emergency Department (ER). * It is also a good idea to bring the pill bottles too. This will help the doctor to make certain you are taking the right medicines and the right dose. Referrals GO TO FACILITY OTHER - SPECIFY

## 2016-01-14 ENCOUNTER — Ambulatory Visit (INDEPENDENT_AMBULATORY_CARE_PROVIDER_SITE_OTHER): Payer: No Typology Code available for payment source | Admitting: Family Medicine

## 2016-01-14 ENCOUNTER — Encounter: Payer: Self-pay | Admitting: Family Medicine

## 2016-01-14 VITALS — BP 118/74 | HR 87 | Temp 98.1°F | Resp 16 | Ht 62.0 in | Wt 153.4 lb

## 2016-01-14 DIAGNOSIS — E876 Hypokalemia: Secondary | ICD-10-CM | POA: Diagnosis not present

## 2016-01-14 DIAGNOSIS — H8112 Benign paroxysmal vertigo, left ear: Secondary | ICD-10-CM

## 2016-01-14 LAB — BASIC METABOLIC PANEL
BUN: 12 mg/dL (ref 6–23)
CHLORIDE: 103 meq/L (ref 96–112)
CO2: 31 meq/L (ref 19–32)
Calcium: 10.4 mg/dL (ref 8.4–10.5)
Creatinine, Ser: 0.62 mg/dL (ref 0.40–1.20)
GFR: 105.33 mL/min (ref >60.00)
GLUCOSE: 87 mg/dL (ref 70–99)
POTASSIUM: 4.8 meq/L (ref 3.5–5.1)
SODIUM: 140 meq/L (ref 135–145)

## 2016-01-14 MED ORDER — MECLIZINE HCL 25 MG PO TABS: ORAL_TABLET | ORAL | Status: DC

## 2016-01-14 NOTE — Patient Instructions (Signed)
Follow up as needed Increase the Meclizine to 1-2 tabs 3x/day as needed Drink plenty of fluids Change positions slowly to avoid dizziness If no improvement by the end of the week, please let me know so we can have you see Neuro Call with any questions or concerns Hang in there!!!

## 2016-01-14 NOTE — Progress Notes (Signed)
   Subjective:    Patient ID: [name] [name], female    DOB: 04-25-1961, 55 y.o.   MRN: [id number]  HPI ER F/U- pt went to ER on 7/6 for dizziness and SOB.  Had normal EKG, normal head CT, and labs were unremarkable w/ exception of low K+.  Pt was dx'd w/ vertigo and d/c'd w/ Meclizine.  Pt reports dizziness is still present but 'much better than it was'.  Pt doesn't feel that meclizine is helping.  Pt is taking 25mg  and told to take Alprazolam at the same time.  Pt is attempting to increase fluids.  No longer having shortness of breath or palpitations.  No longer having numbness of extremities.  Pt feels the associated sxs were due to anxiety over the dizziness.   Review of Systems For ROS see HPI     Objective:   Physical Exam  Constitutional: She is oriented to person, place, and time. She appears well-developed and well-nourished. No distress.  HENT:  Head: Normocephalic and atraumatic.  Mouth/Throat: Uvula is midline and mucous membranes are normal.  TMs WNL No TTP over sinuses Minimal nasal congestion  Eyes: Conjunctivae and EOM are normal. Pupils are equal, round, and reactive to light.  Neck: Normal range of motion. Neck supple.  Cardiovascular: Normal rate, regular rhythm, normal heart sounds and intact distal pulses.   Pulmonary/Chest: Effort normal and breath sounds normal. No respiratory distress. She has no wheezes. She has no rales.  Musculoskeletal: She exhibits no edema.  Lymphadenopathy:    She has no cervical adenopathy.  Neurological: She is alert and oriented to person, place, and time. She has normal reflexes. No cranial nerve deficit.  Skin: Skin is warm and dry.  Psychiatric: She has a normal mood and affect. Her behavior is normal. Judgment and thought content normal.  Vitals reviewed.         Assessment & Plan:  Vertigo- new.  Reviewed ER notes, EKG, CT scan.  Pt reports sxs are improving but continues to have some dizziness.  No red flags on PE.   Increase Meclizine dose.  Note provided for missed work.  Reviewed supportive care and red flags that should prompt return.  Pt expressed understanding and is in agreement w/ plan.   Hypokalemia- new.  Noted on labs.  Repeat BMP today and replete prn.  Pt expressed understanding and is in agreement w/ plan.

## 2016-01-14 NOTE — Progress Notes (Signed)
Pre visit review using our clinic review tool, if applicable. No additional management support is needed unless otherwise documented below in the visit note. 

## 2016-01-15 ENCOUNTER — Other Ambulatory Visit: Payer: Self-pay | Admitting: Family Medicine

## 2016-01-16 MED ORDER — ALPRAZOLAM 0.25 MG PO TABS: 0.2500 mg | ORAL_TABLET | Freq: Two times a day (BID) | ORAL | Status: DC | PRN

## 2016-01-16 NOTE — Telephone Encounter (Signed)
Last OV 01/14/16 Alprazolam last filled 12/17/15 #60 with 0

## 2016-01-18 ENCOUNTER — Telehealth: Payer: Self-pay | Admitting: Family Medicine

## 2016-01-18 DIAGNOSIS — H8112 Benign paroxysmal vertigo, left ear: Secondary | ICD-10-CM

## 2016-01-18 DIAGNOSIS — G629 Polyneuropathy, unspecified: Secondary | ICD-10-CM

## 2016-01-18 NOTE — Telephone Encounter (Signed)
Referral placed.

## 2016-01-18 NOTE — Telephone Encounter (Signed)
Pt states that she is not feeling any better and would like a referral to Neuro. Pt states that she does not want to see Dr Tomi Likens. Did not have a good experience at her last office visit there.

## 2016-02-12 ENCOUNTER — Encounter: Payer: Self-pay | Admitting: Neurology

## 2016-02-12 ENCOUNTER — Ambulatory Visit (INDEPENDENT_AMBULATORY_CARE_PROVIDER_SITE_OTHER): Payer: No Typology Code available for payment source | Admitting: Neurology

## 2016-02-12 DIAGNOSIS — G43019 Migraine without aura, intractable, without status migrainosus: Secondary | ICD-10-CM | POA: Insufficient documentation

## 2016-02-12 DIAGNOSIS — R42 Dizziness and giddiness: Secondary | ICD-10-CM | POA: Diagnosis not present

## 2016-02-12 HISTORY — DX: Migraine without aura, intractable, without status migrainosus: G43.019

## 2016-02-12 NOTE — Patient Instructions (Addendum)
   We will get MRI of the brain and get vestibular rehab.   Vertigo Vertigo means you feel like you or your surroundings are moving when they are not. Vertigo can be dangerous if it occurs when you are at work, driving, or performing difficult activities.  CAUSES  Vertigo occurs when there is a conflict of signals sent to your brain from the visual and sensory systems in your body. There are many different causes of vertigo, including:  Infections, especially in the inner ear.  A bad reaction to a drug or misuse of alcohol and medicines.  Withdrawal from drugs or alcohol.  Rapidly changing positions, such as lying down or rolling over in bed.  A migraine headache.  Decreased blood flow to the brain.  Increased pressure in the brain from a head injury, infection, tumor, or bleeding. SYMPTOMS  You may feel as though the world is spinning around or you are falling to the ground. Because your balance is upset, vertigo can cause nausea and vomiting. You may have involuntary eye movements (nystagmus). DIAGNOSIS  Vertigo is usually diagnosed by physical exam. If the cause of your vertigo is unknown, your caregiver may perform imaging tests, such as an MRI scan (magnetic resonance imaging). TREATMENT  Most cases of vertigo resolve on their own, without treatment. Depending on the cause, your caregiver may prescribe certain medicines. If your vertigo is related to body position issues, your caregiver may recommend movements or procedures to correct the problem. In rare cases, if your vertigo is caused by certain inner ear problems, you may need surgery. HOME CARE INSTRUCTIONS   Follow your caregiver's instructions.  Avoid driving.  Avoid operating heavy machinery.  Avoid performing any tasks that would be dangerous to you or others during a vertigo episode.  Tell your caregiver if you notice that certain medicines seem to be causing your vertigo. Some of the medicines used to treat  vertigo episodes can actually make them worse in some people. SEEK IMMEDIATE MEDICAL CARE IF:   Your medicines do not relieve your vertigo or are making it worse.  You develop problems with talking, walking, weakness, or using your arms, hands, or legs.  You develop severe headaches.  Your nausea or vomiting continues or gets worse.  You develop visual changes.  A family member notices behavioral changes.  Your condition gets worse. MAKE SURE YOU:  Understand these instructions.  Will watch your condition.  Will get help right away if you are not doing well or get worse.   This information is not intended to replace advice given to you by your health care provider. Make sure you discuss any questions you have with your health care provider.   Document Released: 04/02/2005 Document Revised: 09/15/2011 Document Reviewed: 10/16/2014 Elsevier Interactive Patient Education [employer].
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
## 2016-02-14 ENCOUNTER — Other Ambulatory Visit: Payer: Self-pay | Admitting: Family Medicine

## 2016-02-14 MED ORDER — ALPRAZOLAM 0.25 MG PO TABS: 0.2500 mg | ORAL_TABLET | Freq: Two times a day (BID) | ORAL | 0 refills | Status: DC | PRN

## 2016-02-14 NOTE — Telephone Encounter (Signed)
Medication filled to pharmacy as requested.   

## 2016-02-14 NOTE — Telephone Encounter (Signed)
Last OV 01/14/16 Alprazolam last filled 01/21/16 #60 with 0

## 2016-02-26 ENCOUNTER — Ambulatory Visit: Payer: No Typology Code available for payment source | Attending: Neurology

## 2016-02-26 DIAGNOSIS — H8113 Benign paroxysmal vertigo, bilateral: Secondary | ICD-10-CM | POA: Insufficient documentation

## 2016-02-26 DIAGNOSIS — R2689 Other abnormalities of gait and mobility: Secondary | ICD-10-CM | POA: Diagnosis present
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
## 2016-02-27 ENCOUNTER — Ambulatory Visit (INDEPENDENT_AMBULATORY_CARE_PROVIDER_SITE_OTHER): Payer: No Typology Code available for payment source | Admitting: Family Medicine

## 2016-02-27 ENCOUNTER — Encounter: Payer: Self-pay | Admitting: Family Medicine

## 2016-02-27 ENCOUNTER — Ambulatory Visit: Payer: No Typology Code available for payment source | Admitting: Family Medicine

## 2016-02-27 VITALS — BP 122/80 | HR 83 | Temp 98.1°F | Resp 16 | Ht 62.0 in | Wt 155.4 lb

## 2016-02-27 DIAGNOSIS — F419 Anxiety disorder, unspecified: Secondary | ICD-10-CM

## 2016-02-27 DIAGNOSIS — Z23 Encounter for immunization: Secondary | ICD-10-CM | POA: Diagnosis not present

## 2016-02-27 MED ORDER — BUPROPION HCL ER (XL) 150 MG PO TB24: 150.0000 mg | ORAL_TABLET | Freq: Every day | ORAL | 3 refills | Status: DC

## 2016-02-27 MED ORDER — ALPRAZOLAM 0.5 MG PO TABS
0.5000 mg | ORAL_TABLET | Freq: Two times a day (BID) | ORAL | 3 refills | Status: DC | PRN
Start: 2016-02-27 — End: 2016-06-26

## 2016-02-27 NOTE — Patient Instructions (Signed)
Follow up in 3-4 weeks to recheck anxiety Restart the Wellbutrin once daily in the morning Use the 0.5mg  Alprazolam nightly before bed Use 1/2 tab (0.25mg ) Alprazolam as needed for anxiety Try and find a stress outlet- art, music, writing, walking, etc Call with any questions or concerns Hang in there!!!

## 2016-02-27 NOTE — Assessment & Plan Note (Signed)
Deteriorated.  sxs are impacting her relationships at home, her work performance and her sleep.  She is having difficulty w/ memory and concentration.  Having sxs of panic attacks- palpitations, shortness of breath, sweating, numbness/tingling of hands.  Has seen neuro.  Restart low dose Wellbutrin.  Increase Alprazolam for nightly use to improve sleep- pt has done very poorly on Ambien and Trazodone in the past.  Encouraged improved stress management- art, music, exercise, etc.  Will continue to follow closely.

## 2016-02-27 NOTE — Progress Notes (Signed)
Pre visit review using our clinic review tool, if applicable. No additional management support is needed unless otherwise documented below in the visit note. 

## 2016-02-27 NOTE — Progress Notes (Signed)
   Subjective:    Patient ID: [name] [name], female    DOB: 19-May-1961, 55 y.o.   MRN: [id number]  HPI Anxiety- 'it's gotten out of control'.  Pt thinks this is due to her vertigo issues.  Pt is in neuro rehab for her ongoing vertigo and yesterday after one exercise she got clammy, had numbness/tingling of L arm, and 'I thought I was going to pass out'.  Pt only has numbness when vertigo sxs are severe.  This worsens anxiety.  'my mind won't stop', dreaming regularly, heart is racing, some shortness of breath.  Pt has quite a bit of stress at work.  Now having HAs.  Pt has been on Zoloft, Celexa, Wellbutrin.  Pt denies depressive sxs.  Pt finds herself worrying 'all the time'.  Husband and daughter have both commented on her mood changes recently.  + irritability.   Review of Systems For ROS see HPI     Objective:   Physical Exam  Constitutional: She is oriented to person, place, and time. She appears well-developed and well-nourished. No distress.  Neurological: She is alert and oriented to person, place, and time.  Skin: Skin is warm and dry.  Psychiatric:  Anxious, increased motor activity, difficulty w/ concentration  Vitals reviewed.         Assessment & Plan:

## 2016-03-05 ENCOUNTER — Telehealth: Payer: Self-pay | Admitting: Neurology

## 2016-03-05 NOTE — Telephone Encounter (Signed)
Results received and provided to Dr. Jannifer Franklin for review.

## 2016-03-05 NOTE — Telephone Encounter (Signed)
MRI the brain done on 02/25/2016 reveals multiple punctate white matter lesions that are nonspecific, may be associated with the diagnosis of headache or migraine. The lesions are not consistent with demyelinating disease.  I discussed the results of the MRI with the patient. If the headaches continue, we may decide to add Topamax to the regimen, she will contact our office if she decides to do this.

## 2016-03-05 NOTE — Telephone Encounter (Signed)
I called patient. I have not received the report of the MRI, this was done at [facility]. I will try to get the report.

## 2016-03-05 NOTE — Telephone Encounter (Signed)
Patient called to request results of Brain Scan from August 21st.

## 2016-03-07 ENCOUNTER — Ambulatory Visit: Payer: No Typology Code available for payment source

## 2016-03-14 ENCOUNTER — Ambulatory Visit: Payer: No Typology Code available for payment source | Attending: Neurology

## 2016-03-14 DIAGNOSIS — H8113 Benign paroxysmal vertigo, bilateral: Secondary | ICD-10-CM | POA: Insufficient documentation

## 2016-03-14 DIAGNOSIS — R2689 Other abnormalities of gait and mobility: Secondary | ICD-10-CM | POA: Diagnosis present
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
## 2016-03-17 ENCOUNTER — Encounter: Payer: No Typology Code available for payment source | Admitting: Physical Therapy

## 2016-03-19 ENCOUNTER — Encounter: Payer: Self-pay | Admitting: Family Medicine

## 2016-03-19 ENCOUNTER — Ambulatory Visit (INDEPENDENT_AMBULATORY_CARE_PROVIDER_SITE_OTHER): Payer: No Typology Code available for payment source | Admitting: Family Medicine

## 2016-03-19 VITALS — BP 110/80 | HR 86 | Temp 98.2°F | Resp 16 | Ht 62.0 in | Wt 154.5 lb

## 2016-03-19 DIAGNOSIS — F419 Anxiety disorder, unspecified: Secondary | ICD-10-CM

## 2016-03-19 NOTE — Progress Notes (Signed)
   Subjective:    Patient ID: [name] [name], female    DOB: Nov 28, 1960, 55 y.o.   MRN: [id number]  HPI Anxiety- pt reports things are much improved since restarting Wellbutrin and adding the Alprazolam for sleep.  Pt reports she is sleeping better.  Family has noticed an improvement in irritability.  Able to handle things better at work- although work is giving her difficulty over needing to leave early (she works in [place]) to get to her vestibular rehab appts)   Review of Systems For ROS see HPI     Objective:   Physical Exam  Constitutional: She is oriented to person, place, and time. She appears well-developed and well-nourished. No distress.  HENT:  Head: Normocephalic and atraumatic.  Neurological: She is alert and oriented to person, place, and time.  Skin: Skin is warm and dry.  Psychiatric: She has a normal mood and affect. Her behavior is normal. Thought content normal.  Vitals reviewed.         Assessment & Plan:

## 2016-03-19 NOTE — Progress Notes (Signed)
Pre visit review using our clinic review tool, if applicable. No additional management support is needed unless otherwise documented below in the visit note. 

## 2016-03-19 NOTE — Patient Instructions (Signed)
Schedule your complete physical in 3-4 months No med changes at this time!  You look great!!! Call with any questions or concerns Happy Fall!!!

## 2016-03-19 NOTE — Assessment & Plan Note (Signed)
Improved since restarting Wellbutrin and talking the Alprazolam nightly.  Note provided for her to leave work early for vestibular rehab so this should be 1 less stressor for her.  Will continue to follow and adjust meds prn.  Pt expressed understanding and is in agreement w/ plan.

## 2016-03-21 ENCOUNTER — Ambulatory Visit: Payer: No Typology Code available for payment source

## 2016-03-21 DIAGNOSIS — R2689 Other abnormalities of gait and mobility: Secondary | ICD-10-CM

## 2016-03-21 DIAGNOSIS — H8113 Benign paroxysmal vertigo, bilateral: Secondary | ICD-10-CM

## 2016-03-21 NOTE — Patient Instructions (Addendum)
Perform in corner with chair in front of you OR at kitchen sink with chair behind you for safety:  Feet Together (Compliant Surface) Head Motion - Eyes Open    With eyes open, standing on compliant surface: ___pillow/cushion_____, feet together, move head slowly: up and down 5 times and side to side 5 times. Repeat __3__ times per session. Do __1__ sessions per day.  Copyright  VHI. All rights reserved.  Feet Together (Compliant Surface) Varied Arm Positions - Eyes Closed    Stand on compliant surface: __pillow/cushion______ with feet together and arms at your side. Close eyes and visualize upright position. Hold__30__ seconds. Repeat __3__ times per session. Do __1__ sessions per day.  Copyright  VHI. All rights reserved.   Continue to perform every day, until dizziness stops. Then continue exercises 1-2 times a week to maintain progress.
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
## 2016-03-24 ENCOUNTER — Encounter: Payer: No Typology Code available for payment source | Admitting: Physical Therapy

## 2016-03-27 ENCOUNTER — Ambulatory Visit: Payer: No Typology Code available for payment source

## 2016-03-27 DIAGNOSIS — R2689 Other abnormalities of gait and mobility: Secondary | ICD-10-CM

## 2016-03-27 DIAGNOSIS — H8113 Benign paroxysmal vertigo, bilateral: Secondary | ICD-10-CM | POA: Diagnosis not present
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
## 2016-04-04 ENCOUNTER — Encounter: Payer: No Typology Code available for payment source | Admitting: Physical Therapy

## 2016-06-02 ENCOUNTER — Other Ambulatory Visit: Payer: Self-pay | Admitting: Family Medicine

## 2016-06-13 ENCOUNTER — Other Ambulatory Visit: Payer: Self-pay | Admitting: Family Medicine

## 2016-06-26 ENCOUNTER — Other Ambulatory Visit: Payer: Self-pay | Admitting: Family Medicine

## 2016-06-26 MED ORDER — ALPRAZOLAM 0.5 MG PO TABS: 0.5000 mg | ORAL_TABLET | Freq: Two times a day (BID) | ORAL | 3 refills | Status: DC | PRN

## 2016-06-26 NOTE — Telephone Encounter (Signed)
Last OV 03/19/16 Alprazolam last filled 02/27/16  CSC, low risk

## 2016-06-26 NOTE — Telephone Encounter (Signed)
Medication filled to pharmacy as requested.   

## 2016-06-26 NOTE — Telephone Encounter (Signed)
Pt needs refill on ALPRAZolam, [facility] in [place]

## 2016-07-22 ENCOUNTER — Encounter: Payer: Self-pay | Admitting: Family Medicine

## 2016-07-22 ENCOUNTER — Ambulatory Visit (INDEPENDENT_AMBULATORY_CARE_PROVIDER_SITE_OTHER): Payer: No Typology Code available for payment source | Admitting: Family Medicine

## 2016-07-22 VITALS — BP 121/82 | HR 76 | Temp 98.2°F | Resp 16 | Ht 62.0 in | Wt 140.5 lb

## 2016-07-22 DIAGNOSIS — Z0000 Encounter for general adult medical examination without abnormal findings: Secondary | ICD-10-CM

## 2016-07-22 LAB — CBC WITH DIFFERENTIAL/PLATELET
BASOS PCT: 0.6 % (ref 0.0–3.0)
Basophils Absolute: 0 10*3/uL (ref 0.0–0.1)
EOS PCT: 1.5 % (ref 0.0–5.0)
Eosinophils Absolute: 0.1 10*3/uL (ref 0.0–0.7)
HEMATOCRIT: 39.5 % (ref 36.0–46.0)
HEMOGLOBIN: 13.2 g/dL (ref 12.0–15.0)
LYMPHS PCT: 24.3 % (ref 12.0–46.0)
Lymphs Abs: 1.6 10*3/uL (ref 0.7–4.0)
MCHC: 33.4 g/dL (ref 30.0–36.0)
MCV: 87.3 fl (ref 78.0–100.0)
MONO ABS: 0.6 10*3/uL (ref 0.1–1.0)
MONOS PCT: 9 % (ref 3.0–12.0)
Neutro Abs: 4.3 10*3/uL (ref 1.4–7.7)
Neutrophils Relative %: 64.6 % (ref 43.0–77.0)
Platelets: 210 10*3/uL (ref 150.0–400.0)
RBC: 4.53 Mil/uL (ref 3.87–5.11)
RDW: 13.7 % (ref 11.5–15.5)
WBC: 6.7 10*3/uL (ref 4.0–10.5)

## 2016-07-22 LAB — BASIC METABOLIC PANEL
BUN: 12 mg/dL (ref 6–23)
CHLORIDE: 102 meq/L (ref 96–112)
CO2: 29 meq/L (ref 19–32)
CREATININE: 0.71 mg/dL (ref 0.40–1.20)
Calcium: 9.9 mg/dL (ref 8.4–10.5)
GFR: 89.91 mL/min (ref >60.00)
GLUCOSE: 85 mg/dL (ref 70–99)
Potassium: 4.6 mEq/L (ref 3.5–5.1)
Sodium: 139 mEq/L (ref 135–145)

## 2016-07-22 LAB — HEPATIC FUNCTION PANEL
ALBUMIN: 4.5 g/dL (ref 3.5–5.2)
ALT: 12 U/L (ref 0–35)
AST: 18 U/L (ref 0–37)
Alkaline Phosphatase: 74 U/L (ref 39–117)
BILIRUBIN TOTAL: 0.3 mg/dL (ref 0.2–1.2)
Bilirubin, Direct: 0.1 mg/dL (ref 0.0–0.3)
Total Protein: 7.1 g/dL (ref 6.0–8.3)

## 2016-07-22 LAB — LIPID PANEL
CHOLESTEROL: 215 mg/dL — AB (ref 0–200)
HDL: 67.3 mg/dL (ref >39.00)
LDL Cholesterol: 129 mg/dL — ABNORMAL HIGH (ref 0–99)
NONHDL: 147.52
TRIGLYCERIDES: 95 mg/dL (ref 0.0–149.0)
Total CHOL/HDL Ratio: 3
VLDL: 19 mg/dL (ref 0.0–40.0)

## 2016-07-22 LAB — VITAMIN D 25 HYDROXY (VIT D DEFICIENCY, FRACTURES): VITD: 54.82 ng/mL (ref 30.00–100.00)

## 2016-07-22 LAB — TSH: TSH: 0.48 u[IU]/mL (ref 0.35–4.50)

## 2016-07-22 NOTE — Assessment & Plan Note (Signed)
Pt's PE WNL.  UTD on colonoscopy, pap, DEXA.  No need for mammos.  Check labs.  Anticipatory guidance provided.

## 2016-07-22 NOTE — Progress Notes (Signed)
Pre visit review using our clinic review tool, if applicable. No additional management support is needed unless otherwise documented below in the visit note. 

## 2016-07-22 NOTE — Progress Notes (Signed)
   Subjective:    Patient ID: [name] [name], female    DOB: [birth year], 58 y.o.   MRN: [id number]  HPI CPE- UTD on colonoscopy (due 2027), pap, DEXA.  UTD on immunizations.  No need for mammo due to double mastectomy.  Pt is down 15 lbs since last visit.   Review of Systems Patient reports no vision/ hearing changes, adenopathy,fever, weight change,  persistant/recurrent hoarseness , swallowing issues, chest pain, palpitations, edema, persistant/recurrent cough, hemoptysis, dyspnea (rest/exertional/paroxysmal nocturnal), gastrointestinal bleeding (melena, rectal bleeding), abdominal pain, significant heartburn, bowel changes, GU symptoms (dysuria, hematuria, incontinence), Gyn symptoms (abnormal  bleeding, pain),  syncope, focal weakness, memory loss, numbness & tingling, skin/hair/nail changes, abnormal bruising or bleeding, anxiety, or depression.     Objective:   Physical Exam General Appearance:    Alert, cooperative, no distress, appears stated age  Head:    Normocephalic, without obvious abnormality, atraumatic  Eyes:    PERRL, conjunctiva/corneas clear, EOM's intact, fundi    benign, both eyes  Ears:    Normal TM's and external ear canals, both ears  Nose:   Nares normal, septum midline, mucosa normal, no drainage    or sinus tenderness  Throat:   Lips, mucosa, and tongue normal; teeth and gums normal  Neck:   Supple, symmetrical, trachea midline, no adenopathy;    Thyroid: no enlargement/tenderness/nodules  Back:     Symmetric, no curvature, ROM normal, no CVA tenderness  Lungs:     Clear to auscultation bilaterally, respirations unlabored  Chest Wall:    No tenderness or deformity   Heart:    Regular rate and rhythm, S1 and S2 normal, no murmur, rub   or gallop  Breast Exam:    Deferred due to mastectomy  Abdomen:     Soft, non-tender, bowel sounds active all four quadrants,    no masses, no organomegaly  Genitalia:    Deferred  Rectal:    Extremities:   Extremities  normal, atraumatic, no cyanosis or edema  Pulses:   2+ and symmetric all extremities  Skin:   Skin color, texture, turgor normal, no rashes or lesions  Lymph nodes:   Cervical, supraclavicular, and axillary nodes normal  Neurologic:   CNII-XII intact, normal strength, sensation and reflexes    throughout          Assessment & Plan:

## 2016-07-22 NOTE — Patient Instructions (Signed)
Follow up in 1 year- sooner if needed We'll notify you of your lab results and make any changes if needed Keep up the good work on healthy diet and regular exercise- you look great! Call with any questions or concerns Happy New Year!!!

## 2016-07-23 ENCOUNTER — Other Ambulatory Visit: Payer: Self-pay | Admitting: Family Medicine

## 2016-08-08 ENCOUNTER — Ambulatory Visit: Payer: No Typology Code available for payment source | Admitting: Family Medicine

## 2016-09-30 ENCOUNTER — Encounter: Payer: Self-pay | Admitting: Genetics

## 2016-10-13 ENCOUNTER — Other Ambulatory Visit (HOSPITAL_COMMUNITY)
Admission: RE | Admit: 2016-10-13 | Discharge: 2016-10-13 | Disposition: A | Discharge status: Home / Self Care | Payer: No Typology Code available for payment source | Source: Ambulatory Visit | Attending: Family Medicine | Admitting: Family Medicine

## 2016-10-13 ENCOUNTER — Encounter: Payer: Self-pay | Admitting: Family Medicine

## 2016-10-13 ENCOUNTER — Ambulatory Visit (INDEPENDENT_AMBULATORY_CARE_PROVIDER_SITE_OTHER): Payer: No Typology Code available for payment source | Admitting: Family Medicine

## 2016-10-13 VITALS — BP 120/80 | HR 76 | Temp 98.1°F | Resp 16 | Ht 62.0 in | Wt 133.0 lb

## 2016-10-13 DIAGNOSIS — Z202 Contact with and (suspected) exposure to infections with a predominantly sexual mode of transmission: Secondary | ICD-10-CM | POA: Insufficient documentation

## 2016-10-13 MED ORDER — PROMETHAZINE HCL 25 MG PO TABS: 25.0000 mg | ORAL_TABLET | Freq: Four times a day (QID) | ORAL | 0 refills | Status: DC | PRN

## 2016-10-13 NOTE — Patient Instructions (Signed)
Follow up as needed We'll notify you of your lab results and make any changes if needed I am so proud of how you are handling things.  You are one of the strongest women I know! Hang in there!!!  You can do this!

## 2016-10-13 NOTE — Addendum Note (Signed)
Addended by: Midge Minium on: 10/13/2016 11:26 AM   Modules accepted: Orders

## 2016-10-13 NOTE — Progress Notes (Signed)
Pre visit review using our clinic review tool, if applicable. No additional management support is needed unless otherwise documented below in the visit note. 
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
## 2016-10-14 ENCOUNTER — Telehealth: Payer: Self-pay | Admitting: Family Medicine

## 2016-10-14 DIAGNOSIS — F329 Major depressive disorder, single episode, unspecified: Secondary | ICD-10-CM

## 2016-10-14 DIAGNOSIS — F32A Depression, unspecified: Secondary | ICD-10-CM

## 2016-10-14 LAB — RPR

## 2016-10-14 LAB — HIV ANTIBODY (ROUTINE TESTING W REFLEX): HIV 1&2 Ab, 4th Generation: NONREACTIVE

## 2016-10-14 LAB — URINE CYTOLOGY ANCILLARY ONLY
Chlamydia: NEGATIVE
NEISSERIA GONORRHEA: NEGATIVE
TRICH (WINDOWPATH): NEGATIVE

## 2016-10-14 NOTE — Telephone Encounter (Signed)
Ok for referral? Please advise on Dx?  

## 2016-10-14 NOTE — Telephone Encounter (Signed)
[facility] for counseling referral in [place] (dx depression) but I don't personally know any providers to recommend

## 2016-10-14 NOTE — Telephone Encounter (Signed)
Pt asking for a referral to a counselor in the [place], [place] area, pt states that she was going to go through work, but has changed her mind.

## 2016-10-15 ENCOUNTER — Other Ambulatory Visit: Payer: Self-pay | Admitting: Family Medicine

## 2016-10-15 NOTE — Telephone Encounter (Signed)
Referral placed.

## 2016-10-16 ENCOUNTER — Other Ambulatory Visit: Payer: Self-pay | Admitting: General Practice

## 2016-10-16 MED ORDER — VALACYCLOVIR HCL 500 MG PO TABS: 500.0000 mg | ORAL_TABLET | Freq: Every day | ORAL | 3 refills | Status: DC

## 2016-10-23 ENCOUNTER — Ambulatory Visit (HOSPITAL_BASED_OUTPATIENT_CLINIC_OR_DEPARTMENT_OTHER): Payer: No Typology Code available for payment source | Admitting: Genetics

## 2016-10-23 ENCOUNTER — Other Ambulatory Visit: Payer: No Typology Code available for payment source

## 2016-10-23 ENCOUNTER — Encounter: Payer: Self-pay | Admitting: Genetics

## 2016-10-23 DIAGNOSIS — Z803 Family history of malignant neoplasm of breast: Secondary | ICD-10-CM

## 2016-10-23 DIAGNOSIS — Z7183 Encounter for nonprocreative genetic counseling: Secondary | ICD-10-CM

## 2016-10-23 DIAGNOSIS — Z853 Personal history of malignant neoplasm of breast: Secondary | ICD-10-CM
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
## 2016-11-03 ENCOUNTER — Encounter: Payer: Self-pay | Admitting: Genetics

## 2016-11-03 ENCOUNTER — Telehealth: Payer: Self-pay | Admitting: Genetics

## 2016-11-03 ENCOUNTER — Ambulatory Visit: Payer: Self-pay | Admitting: Genetics

## 2016-11-03 DIAGNOSIS — Z1379 Encounter for other screening for genetic and chromosomal anomalies: Secondary | ICD-10-CM

## 2016-11-03 HISTORY — DX: Encounter for other screening for genetic and chromosomal anomalies: Z13.79

## 2016-11-03 NOTE — Telephone Encounter (Signed)
Reviewed that germline genetic testing revealed no pathogenic mutations. This is considered to be a negative result. Testing was performed through [facility]'s 46-gene Common Hereditary Cancers Panel. [facility] Common Hereditary Cancers Panel includes analysis of the following 46 genes: APC, ATM, AXIN2, BARD1, BMPR1A, BRCA1, BRCA2, BRIP1, CDH1, CDKN2A, CHEK2, CTNNA1, DICER1, EPCAM, GREM1, HOXB13, KIT, MEN1, MLH1, MSH2, MSH3, MSH6, MUTYH, NBN, NF1, NTHL1, PALB2, PDGFRA, PMS2, POLD1, POLE, PTEN, RAD50, RAD51C, RAD51D, SDHA, SDHB, SDHC, SDHD, SMAD4, SMARCA4, STK11, TP53, TSC1, TSC2, and VHL.  For more detailed discussion, please see genetic counseling documentation from 11/03/2016. Result report dated 10/30/2016.

## 2016-11-03 NOTE — Progress Notes (Signed)
HPI: [name] [name] was previously seen in the [place] clinic due to a personal and family history of cancer and concerns regarding a hereditary predisposition to cancer. Please refer to our prior cancer genetics clinic note for more information regarding [name] [name]'s medical, social and family histories, and our assessment and recommendations, at the time. Ms. Tyndall recent genetic test results were disclosed to her, as were recommendations warranted by these results. These results and recommendations are discussed in more detail below.  CANCER HISTORY: Ms. Scherzinger was diagnosed with left breast cancer at age 1. Her treatment included bilateral mastectomies and tamoxifen x1 year.  FAMILY HISTORY:  We obtained a detailed, 4-generation family history.  Significant diagnoses are listed below: Family History  Problem Relation Age of Onset  . Heart disease Mother   . Diabetes Mother   . Hyperlipidemia Mother   . Stroke Mother   . Cirrhosis Mother   . Hepatitis C Mother   . Liver cancer Mother     d.64  . Lung cancer Father 78    d.70  . Colon cancer Father 53  . Heart disease Sister   . Diabetes Sister   . Diabetes Brother   . Heart disease Brother   . Heart disease Sister   . Diabetes Sister   . Thyroid cancer Maternal Aunt 45  . Breast cancer Maternal Grandmother 40    d.57 dx'ed at 46.  Marland Kitchen Pancreatic cancer Maternal Grandmother     dx'ed after breast cancer.  . Lung cancer Paternal Uncle 49    d.62  . Lung cancer Maternal Grandfather     d.68s  . Cancer Paternal Grandmother     d.40s - unspecified cancer type  . Lung cancer Paternal Uncle     d.60s  . Pancreatic cancer Paternal Uncle    Ms. Simmonds has two living daughters, ages 39 and 26 and a son who died at 85 by suicide. She reports that she is not in close contact with her older daughter. None of her children have had cancers. Ms. Pat has two brothers (ages 10 and 70) and two sisters (ages  70 and 93). None of her siblings have had cancers, though she reports that she is not in contact with her youngest brother and sister.  Ms. Jaime's mother died at 71 with liver cancer. She also had a history of Hepatitis C acquired by blood transfusion. Ms. Dayton mother has two full-sisters (age 59 and 56) and a maternal half-sister (age unknown). The 84 year old sister had thyroid cancer in her mid-50s. Ms. Rutan maternal grandmother had breast cancer at age 78 and later pancreatic cancer. She died at 73. Ms. Steinhaus maternal grandfather had lung cancer and died in his late-60s.  Ms. Degrave's father had colon cancer at age 36 and lung cancer at 73. He died within 2 weeks of his lung cancer diagnosis. Her father had two brothers and one sister. Both brothers had lung cancer; one also had pancreatic cancer. All of Ms. Belardo's father's siblings died in their 67s. Ms. Kocak does not know any information about her paternal grandfather. She thinks her paternal grandmother died of cancer in her 80s, but she does not know the type.  Ms. Rappa reports that her youngest daughter had negative genetic testing for hereditary cancer syndromes. Patient's maternal and paternal ancestors of of Native [place] and Caucasian descent. There is no reported Ashkenazi Jewish ancestry. There is no known consanguinity.  GENETIC TEST RESULTS: Genetic testing  performed through [facility]'s Common Hereditary Cancers Panel reported out on 10/30/2016 showed no deleterious mutations. [facility] Common Hereditary Cancers Panel includes analysis of the following 46 genes: APC, ATM, AXIN2, BARD1, BMPR1A, BRCA1, BRCA2, BRIP1, CDH1, CDKN2A, CHEK2, CTNNA1, DICER1, EPCAM, GREM1, HOXB13, KIT, MEN1, MLH1, MSH2, MSH3, MSH6, MUTYH, NBN, NF1, NTHL1, PALB2, PDGFRA, PMS2, POLD1, POLE, PTEN, RAD50, RAD51C, RAD51D, SDHA, SDHB, SDHC, SDHD, SMAD4, SMARCA4, STK11, TP53, TSC1, TSC2, and VHL.  The test report will be scanned into  [employer] and will be located under the Molecular Pathology section of the Results Review tab.A portion of the result report is included below for reference.    We discussed with Ms. Deisher that since the current genetic testing is not perfect, it is possible there may be a gene mutation in one of these genes that current testing cannot detect, but that chance is small. We also discussed, that it is possible that another gene that has not yet been discovered, or that we have not yet tested, is responsible for the cancer diagnoses in the family. Therefore, important to remain in touch with cancer genetics in the future so that we can continue to offer Ms. Grasse the most up to date genetic testing.   CANCER SCREENING RECOMMENDATIONS: Given Ms. Andal's personal and family histories, we must interpret these negative results with some caution.  Families with features suggestive of hereditary risk for cancer tend to have multiple family members with cancer, diagnoses in multiple generations and diagnoses before the age of 80. Ms. Stickley's family exhibits some of these features. Thus this result may simply reflect our current inability to detect all mutations within these genes or there may be a different gene that has not yet been discovered or tested. However, since no causative or actionable mutations were identified, Ms. Hild was advised to continue the breast and general cancer surveillance recommended by her treating and primary care physicians.  RECOMMENDATIONS FOR FAMILY MEMBERS: Women in this family might be at some increased risk of developing breast cancer, over the general population risk, simply due to the family history of cancer. We recommended women in this family have a yearly mammogram beginning at age 51, or 31 years younger than the earliest onset of cancer, an annual clinical breast exam, and perform monthly breast self-exams. We discussed that this would mean her daughter starts  annual breast screenings through mammograms/breast MRIs at age 58. Women in this family should also have a gynecological exam as recommended by their primary provider. All family members should have a colonoscopy by age 36.  Because Ms. Sees tested negative for mutations, there is no genetic testing recommended for her children based on her history. However, other maternal family members appear to be candidates for testing, including her siblings and maternal aunts and cousins. Ms. Mencer was encouraged to contact us if we can be of assistance in identifying genetic counseling and testing resources for these family members.  FOLLOW-UP: Lastly, we discussed with Ms. Kleinfelter that cancer genetics is a rapidly advancing field and it is possible that new genetic tests will be appropriate for her and/or her family members in the future. We encouraged her to remain in contact with cancer genetics on an annual basis so we can update her personal and family histories and let her know of advances in cancer genetics that may benefit this family.   Our contact number was provided. Ms. Murren questions were answered to her satisfaction, and she knows she is welcome to call us at  anytime with additional questions or concerns.   Mal Misty, MS, [facility] Certified Naval [email or web address]

## 2016-11-12 ENCOUNTER — Encounter: Payer: Self-pay | Admitting: Family Medicine

## 2016-11-12 MED ORDER — ALPRAZOLAM 0.5 MG PO TABS: 0.5000 mg | ORAL_TABLET | Freq: Two times a day (BID) | ORAL | 3 refills | Status: DC | PRN

## 2016-11-12 NOTE — Telephone Encounter (Signed)
Last OV 10/13/16 Alprazolam last filled 06/26/16

## 2016-12-24 ENCOUNTER — Encounter: Payer: Self-pay | Admitting: Family Medicine

## 2016-12-24 ENCOUNTER — Ambulatory Visit (INDEPENDENT_AMBULATORY_CARE_PROVIDER_SITE_OTHER): Payer: No Typology Code available for payment source | Admitting: Family Medicine

## 2016-12-24 VITALS — BP 121/81 | HR 82 | Resp 16 | Ht 62.0 in | Wt 132.0 lb

## 2016-12-24 DIAGNOSIS — R234 Changes in skin texture: Secondary | ICD-10-CM

## 2016-12-24 DIAGNOSIS — R239 Unspecified skin changes: Secondary | ICD-10-CM

## 2016-12-24 NOTE — Progress Notes (Signed)
   Subjective:    Patient ID: [name] [name], female    DOB: 01-09-1961, 56 y.o.   MRN: [id number]  HPI Facial bumps- pt reports areas have been present for 'months and months'.  One is at corner of L eye- pt feels this may be interfering.  But not painful or itchy.  Not spreading.   Review of Systems For ROS see HPI     Objective:   Physical Exam  Constitutional: She appears well-developed and well-nourished. No distress.  HENT:  Head: Normocephalic and atraumatic.  Skin: Skin is warm and dry.  Solar damage to arms bilaterally Very small inclusion cyst just lateral to L eye Small pearly bump on R side of nasal bridge- inclusion cyst vs non-melanoma skin cancer  Vitals reviewed.         Assessment & Plan:  Skin changes- new.  Pt has 2 small lesions on her face that appear to be consistent w/ inclusion cysts but the 1 on her nasal bridge could be a non-melanoma skin cancer.  Refer to derm for complete evaluation and tx.  Pt expressed understanding and is in agreement w/ plan.

## 2016-12-24 NOTE — Patient Instructions (Signed)
Follow up as needed/scheduled We'll call you with your dermatology appt for the skin changes The areas on your face appear to be inclusion cysts and the areas on your arms appear to be sun damage Call with any questions or concerns Happy 4th of July!!!

## 2016-12-24 NOTE — Progress Notes (Signed)
Pre visit review using our clinic review tool, if applicable. No additional management support is needed unless otherwise documented below in the visit note. 

## 2017-01-26 ENCOUNTER — Other Ambulatory Visit (INDEPENDENT_AMBULATORY_CARE_PROVIDER_SITE_OTHER): Payer: No Typology Code available for payment source

## 2017-01-26 ENCOUNTER — Ambulatory Visit (INDEPENDENT_AMBULATORY_CARE_PROVIDER_SITE_OTHER): Payer: No Typology Code available for payment source | Admitting: Family Medicine

## 2017-01-26 ENCOUNTER — Encounter: Payer: Self-pay | Admitting: Family Medicine

## 2017-01-26 VITALS — BP 121/78 | HR 78 | Temp 98.1°F | Resp 16 | Ht 62.0 in | Wt 131.1 lb

## 2017-01-26 DIAGNOSIS — E785 Hyperlipidemia, unspecified: Secondary | ICD-10-CM

## 2017-01-26 DIAGNOSIS — E038 Other specified hypothyroidism: Secondary | ICD-10-CM | POA: Diagnosis not present

## 2017-01-26 LAB — CBC WITH DIFFERENTIAL/PLATELET
BASOS ABS: 0 10*3/uL (ref 0.0–0.1)
Basophils Relative: 0.2 % (ref 0.0–3.0)
EOS PCT: 0.9 % (ref 0.0–5.0)
Eosinophils Absolute: 0.1 10*3/uL (ref 0.0–0.7)
HCT: 40.3 % (ref 36.0–46.0)
Hemoglobin: 13.4 g/dL (ref 12.0–15.0)
LYMPHS ABS: 2 10*3/uL (ref 0.7–4.0)
Lymphocytes Relative: 37.7 % (ref 12.0–46.0)
MCHC: 33.1 g/dL (ref 30.0–36.0)
MCV: 88.8 fl (ref 78.0–100.0)
MONO ABS: 0.4 10*3/uL (ref 0.1–1.0)
Monocytes Relative: 7.7 % (ref 3.0–12.0)
NEUTROS PCT: 53.5 % (ref 43.0–77.0)
Neutro Abs: 2.9 10*3/uL (ref 1.4–7.7)
Platelets: 228 10*3/uL (ref 150.0–400.0)
RBC: 4.54 Mil/uL (ref 3.87–5.11)
RDW: 13.8 % (ref 11.5–15.5)
WBC: 5.4 10*3/uL (ref 4.0–10.5)

## 2017-01-26 LAB — HEPATIC FUNCTION PANEL
ALBUMIN: 4.5 g/dL (ref 3.5–5.2)
ALK PHOS: 73 U/L (ref 39–117)
ALT: 13 U/L (ref 0–35)
AST: 16 U/L (ref 0–37)
Bilirubin, Direct: 0 mg/dL (ref 0.0–0.3)
TOTAL PROTEIN: 6.8 g/dL (ref 6.0–8.3)
Total Bilirubin: 0.3 mg/dL (ref 0.2–1.2)

## 2017-01-26 LAB — LIPID PANEL
CHOLESTEROL: 204 mg/dL — AB (ref 0–200)
HDL: 65.1 mg/dL (ref >39.00)
LDL Cholesterol: 111 mg/dL — ABNORMAL HIGH (ref 0–99)
NonHDL: 138.4
TRIGLYCERIDES: 137 mg/dL (ref 0.0–149.0)
Total CHOL/HDL Ratio: 3
VLDL: 27.4 mg/dL (ref 0.0–40.0)

## 2017-01-26 LAB — BASIC METABOLIC PANEL
BUN: 14 mg/dL (ref 6–23)
CO2: 31 meq/L (ref 19–32)
CREATININE: 0.7 mg/dL (ref 0.40–1.20)
Calcium: 10 mg/dL (ref 8.4–10.5)
Chloride: 100 mEq/L (ref 96–112)
GFR: 91.23 mL/min (ref >60.00)
GLUCOSE: 83 mg/dL (ref 70–99)
POTASSIUM: 4.6 meq/L (ref 3.5–5.1)
Sodium: 138 mEq/L (ref 135–145)

## 2017-01-26 NOTE — Progress Notes (Signed)
Pre visit review using our clinic review tool, if applicable. No additional management support is needed unless otherwise documented below in the visit note. 

## 2017-01-26 NOTE — Assessment & Plan Note (Signed)
Chronic problem.  Currently controlled w/ healthy diet and regular exercise.  Check labs to determine if changes to treatment plan need to be made.  Forms completed for pt's life insurance application.

## 2017-01-26 NOTE — Assessment & Plan Note (Signed)
Chronic problem . Following w/ Dr Mare Ferrari.  Currently in the process of having meds adjusted.  Will follow along and assist as able.

## 2017-01-26 NOTE — Progress Notes (Signed)
   Subjective:    Patient ID: [name] [name], female    DOB: [birth year], 56 y.o.   MRN: [id number]  HPI Hyperlipidemia- chronic problem.  Currently controlled w/ healthy diet and regular exercise.  No CP, SOB, HAs, visual changes, abd pain, N/V, myalgias.    Hypothyroid- chronic problem, on Levothyroxine 11mcg daily.  No excessive fatigue.  Having some hair loss due to thyroid issues but no changes to skin or nails.   Review of Systems For ROS see HPI     Objective:   Physical Exam  Constitutional: She is oriented to person, place, and time. She appears well-developed and well-nourished. No distress.  HENT:  Head: Normocephalic and atraumatic.  Eyes: Pupils are equal, round, and reactive to light. Conjunctivae and EOM are normal.  Neck: Normal range of motion. Neck supple. No thyromegaly present.  Cardiovascular: Normal rate, regular rhythm, normal heart sounds and intact distal pulses.   No murmur heard. Pulmonary/Chest: Effort normal and breath sounds normal. No respiratory distress.  Abdominal: Soft. She exhibits no distension. There is no tenderness.  Musculoskeletal: She exhibits no edema.  Lymphadenopathy:    She has no cervical adenopathy.  Neurological: She is alert and oriented to person, place, and time.  Skin: Skin is warm and dry.  Psychiatric: She has a normal mood and affect. Her behavior is normal.  Vitals reviewed.         Assessment & Plan:

## 2017-01-26 NOTE — Patient Instructions (Signed)
Follow up as needed/scheduled Please go to [facility] (off [address]) to get your labs done Keep up the good work!  You look great! Call with any questions or concerns Hang in there!  You're doing great!

## 2017-02-17 ENCOUNTER — Telehealth: Payer: Self-pay | Admitting: Family Medicine

## 2017-02-17 NOTE — Telephone Encounter (Signed)
Patient dropped off medical history form received from [employer].  Requesting to have it completed by pcp.  Charge sheet generated, form placed in md basket in front office.

## 2017-02-19 NOTE — Telephone Encounter (Signed)
Forms completed and placed in basket 

## 2017-02-19 NOTE — Telephone Encounter (Signed)
Form faxed to number provided on form

## 2017-03-12 ENCOUNTER — Encounter: Payer: Self-pay | Admitting: Family Medicine

## 2017-03-12 ENCOUNTER — Ambulatory Visit (INDEPENDENT_AMBULATORY_CARE_PROVIDER_SITE_OTHER): Payer: No Typology Code available for payment source | Admitting: Family Medicine

## 2017-03-12 ENCOUNTER — Other Ambulatory Visit: Payer: Self-pay | Admitting: Family Medicine

## 2017-03-12 VITALS — BP 120/80 | HR 82 | Temp 98.2°F | Resp 16 | Ht 62.0 in | Wt 133.2 lb

## 2017-03-12 DIAGNOSIS — J392 Other diseases of pharynx: Secondary | ICD-10-CM

## 2017-03-12 DIAGNOSIS — J029 Acute pharyngitis, unspecified: Secondary | ICD-10-CM | POA: Diagnosis not present

## 2017-03-12 LAB — POCT RAPID STREP A (OFFICE): Rapid Strep A Screen: NEGATIVE

## 2017-03-12 MED ORDER — LIDOCAINE VISCOUS 2 % MT SOLN: OROMUCOSAL | 0 refills | Status: DC

## 2017-03-12 MED ORDER — NYSTATIN 100000 UNIT/ML MT SUSP: 5.0000 mL | Freq: Four times a day (QID) | OROMUCOSAL | 0 refills | Status: DC

## 2017-03-12 NOTE — Patient Instructions (Addendum)
Follow up as needed or as scheduled Use the Nystatin swish and spit 4x/day x5 days to treat possible thrush Use the viscous lidocaine- again swish and spit- as needed for pain Drink plenty of fluids to avoid dehydration Call with any questions or concerns Hang in there!!!

## 2017-03-12 NOTE — Addendum Note (Signed)
Addended by: Davis Gourd on: 03/12/2017 03:24 PM   Modules accepted: Orders

## 2017-03-12 NOTE — Progress Notes (Signed)
   Subjective:    Patient ID: [name] [name], female    DOB: 16-Mar-1961, 56 y.o.   MRN: [id number]  HPI Sore throat- sxs started Saturday but have worsened since then.  No fevers.  Denies nasal congestion, sinus pressure.  Very painful to swallow.  Has been on recent abx.     Review of Systems For ROS see HPI     Objective:   Physical Exam  Constitutional: She is oriented to person, place, and time. She appears well-developed and well-nourished. No distress.  HENT:  Head: Normocephalic and atraumatic.  Nose: Nose normal.  Pt has shallow ulcer of R posterior pharynx w/ white exudate, no obvious tonsillar abnormality  Neck: Normal range of motion. Neck supple.  Lymphadenopathy:    She has no cervical adenopathy.  Neurological: She is alert and oriented to person, place, and time.  Skin: Skin is warm and dry.  Psychiatric: She has a normal mood and affect. Her behavior is normal. Thought content normal.  Vitals reviewed.         Assessment & Plan:  Pharyngeal ulcer- new.  Unclear if this is viral or early thrush given recent abx use.  Start nystatin swish and spit and also viscous lido for pain relief.  Strep was negative- no need for abx.  Reviewed supportive care and red flags that should prompt return.  Pt expressed understanding and is in agreement w/ plan.

## 2017-03-12 NOTE — Telephone Encounter (Signed)
Medication filled to pharmacy as requested.   

## 2017-03-12 NOTE — Progress Notes (Signed)
Pre visit review using our clinic review tool, if applicable. No additional management support is needed unless otherwise documented below in the visit note. 

## 2017-03-12 NOTE — Telephone Encounter (Signed)
Last OV 12/24/16 Alprazolam last filled 11/12/16 #60 with 3  CSC, low risk, last UDS 2014

## 2017-03-17 NOTE — Telephone Encounter (Signed)
Patient called to say that the insurance company now states that they are needing pathology information from when she had breast cancer.  She is asking if those can be faxed to   [place]: [name]   [address]   [phone]

## 2017-03-18 NOTE — Telephone Encounter (Signed)
Please advise, does this have to go to HIM?

## 2017-03-18 NOTE — Telephone Encounter (Signed)
Yes- pt will need to complete a record release for this as the pathology report was not something done at this office

## 2017-03-18 NOTE — Telephone Encounter (Signed)
Patient has been informed of information.  Stated verbal understanding. She is reaching out to the oncologist/lab where testing was done.

## 2017-04-09 ENCOUNTER — Other Ambulatory Visit: Payer: Self-pay | Admitting: Family Medicine

## 2017-04-10 NOTE — Telephone Encounter (Signed)
Medication filled to pharmacy as requested.   

## 2017-04-10 NOTE — Telephone Encounter (Signed)
Last OV 03/12/17 Alprazolam last filled 03/12/17 #60 with 0  CSC on file

## 2017-04-17 ENCOUNTER — Ambulatory Visit: Payer: No Typology Code available for payment source | Admitting: Physician Assistant

## 2017-04-29 ENCOUNTER — Ambulatory Visit: Payer: No Typology Code available for payment source | Admitting: Family Medicine

## 2017-05-04 ENCOUNTER — Ambulatory Visit (INDEPENDENT_AMBULATORY_CARE_PROVIDER_SITE_OTHER): Payer: No Typology Code available for payment source | Admitting: Family Medicine

## 2017-05-04 ENCOUNTER — Encounter: Payer: Self-pay | Admitting: Family Medicine

## 2017-05-04 ENCOUNTER — Telehealth: Payer: Self-pay | Admitting: Oncology

## 2017-05-04 DIAGNOSIS — F329 Major depressive disorder, single episode, unspecified: Secondary | ICD-10-CM | POA: Diagnosis not present

## 2017-05-04 DIAGNOSIS — F419 Anxiety disorder, unspecified: Secondary | ICD-10-CM | POA: Insufficient documentation

## 2017-05-04 DIAGNOSIS — F32A Depression, unspecified: Secondary | ICD-10-CM | POA: Insufficient documentation

## 2017-05-04 MED ORDER — VORTIOXETINE HBR 10 MG PO TABS: 1.0000 | ORAL_TABLET | Freq: Every day | ORAL | 6 refills | Status: DC

## 2017-05-04 NOTE — Assessment & Plan Note (Signed)
Deteriorated.  Pt is having a hard time w/ anxiety and depression but is absolutely distraught at her hair loss.  'I lost my teeth, I lost my breasts, and now I'm losing my hair'.  This is understandably hard for her.  Will wean off Wellbutrin as we uptitrate Trintellix b/c pt has failed Zoloft in the past.  Will follow closely.  Pt expressed understanding and is in agreement w/ plan.

## 2017-05-04 NOTE — Patient Instructions (Addendum)
Follow up by phone or MyChart in 4-6 weeks to let me know how you're doing Decrease the Wellbutrin to 1 tab every other day x2 weeks START the Trintellix once daily Continue the Biotin, MultiVitamin, and add an iron supplement daily Call with any questions or concerns Hang in there!  You're doing great!

## 2017-05-04 NOTE — Progress Notes (Signed)
   Subjective:    Patient ID: [name] [name], female    DOB: 02-14-1961, 56 y.o.   MRN: [id number]  HPI Anxiety/depression- pt doesn't want to go off the Wellbutrin but she reports she is having hair loss.  Dr Mare Ferrari (Endo) tells her that it's not her thyroid.  Currently taking [employer] supplements for hair and nails.  After reading online, she is convinced that Wellbutrin is causing her hair loss and she doesn't know what to do.   Review of Systems For ROS see HPI     Objective:   Physical Exam  Constitutional: She is oriented to person, place, and time. She appears well-developed and well-nourished. No distress.  HENT:  Head: Normocephalic and atraumatic.  Neurological: She is alert and oriented to person, place, and time.  Skin: Skin is warm and dry.  Psychiatric: She has a normal mood and affect. Her behavior is normal. Thought content normal.  Vitals reviewed.         Assessment & Plan:

## 2017-05-04 NOTE — Telephone Encounter (Signed)
FAXED PATH REPORTS TO [facility] [phone]

## 2017-05-05 ENCOUNTER — Other Ambulatory Visit: Payer: Self-pay | Admitting: General Practice

## 2017-05-05 MED ORDER — VORTIOXETINE HBR 10 MG PO TABS: 1.0000 | ORAL_TABLET | Freq: Every day | ORAL | 1 refills | Status: DC

## 2017-05-07 ENCOUNTER — Other Ambulatory Visit: Payer: Self-pay | Admitting: Family Medicine

## 2017-05-18 ENCOUNTER — Other Ambulatory Visit: Payer: Self-pay | Admitting: Physician Assistant

## 2017-05-18 MED ORDER — ALPRAZOLAM 0.5 MG PO TABS: 0.5000 mg | ORAL_TABLET | Freq: Two times a day (BID) | ORAL | 0 refills | Status: DC | PRN

## 2017-05-18 NOTE — Telephone Encounter (Signed)
Medication filled to pharmacy as requested.   

## 2017-05-18 NOTE — Telephone Encounter (Signed)
Last OV 05/04/17 Alprazolam last filled 04/10/17 #60 with 0

## 2017-05-26 ENCOUNTER — Other Ambulatory Visit: Payer: Self-pay | Admitting: General Practice

## 2017-05-26 NOTE — Telephone Encounter (Signed)
Last OV 05/04/17 Alprazolam last filled 05/18/17 #60 with 0   This was originally printed for patient assistance. Could you look in the database to see if she has had this filled?

## 2017-05-27 NOTE — Telephone Encounter (Signed)
Filled on 11/12- no refills at this time

## 2017-06-15 ENCOUNTER — Other Ambulatory Visit: Payer: Self-pay | Admitting: Family Medicine

## 2017-06-16 ENCOUNTER — Other Ambulatory Visit: Payer: Self-pay | Admitting: Family Medicine

## 2017-06-17 MED ORDER — ALPRAZOLAM 0.5 MG PO TABS: 0.5000 mg | ORAL_TABLET | Freq: Two times a day (BID) | ORAL | 3 refills | Status: DC | PRN

## 2017-06-17 NOTE — Telephone Encounter (Signed)
Last OV 05/04/17 Alprazolam last filled 05/18/17 #60 with 0

## 2017-07-23 ENCOUNTER — Other Ambulatory Visit: Payer: Self-pay

## 2017-07-23 ENCOUNTER — Ambulatory Visit (INDEPENDENT_AMBULATORY_CARE_PROVIDER_SITE_OTHER): Payer: Federal, State, Local not specified - PPO | Admitting: Family Medicine

## 2017-07-23 ENCOUNTER — Encounter: Payer: Self-pay | Admitting: Family Medicine

## 2017-07-23 ENCOUNTER — Other Ambulatory Visit (HOSPITAL_COMMUNITY)
Admission: RE | Admit: 2017-07-23 | Discharge: 2017-07-23 | Disposition: A | Discharge status: Home / Self Care | Payer: Federal, State, Local not specified - PPO | Source: Ambulatory Visit | Attending: Family Medicine | Admitting: Family Medicine

## 2017-07-23 VITALS — BP 110/82 | HR 86 | Temp 98.2°F | Resp 16 | Ht 62.0 in | Wt 134.5 lb

## 2017-07-23 DIAGNOSIS — Z0001 Encounter for general adult medical examination with abnormal findings: Secondary | ICD-10-CM | POA: Diagnosis not present

## 2017-07-23 DIAGNOSIS — R300 Dysuria: Secondary | ICD-10-CM

## 2017-07-23 DIAGNOSIS — Z124 Encounter for screening for malignant neoplasm of cervix: Secondary | ICD-10-CM | POA: Diagnosis not present

## 2017-07-23 DIAGNOSIS — E038 Other specified hypothyroidism: Secondary | ICD-10-CM

## 2017-07-23 DIAGNOSIS — E538 Deficiency of other specified B group vitamins: Secondary | ICD-10-CM | POA: Diagnosis not present

## 2017-07-23 DIAGNOSIS — F419 Anxiety disorder, unspecified: Secondary | ICD-10-CM

## 2017-07-23 DIAGNOSIS — Z0000 Encounter for general adult medical examination without abnormal findings: Secondary | ICD-10-CM

## 2017-07-23 DIAGNOSIS — F329 Major depressive disorder, single episode, unspecified: Secondary | ICD-10-CM | POA: Diagnosis not present

## 2017-07-23 DIAGNOSIS — F32A Depression, unspecified: Secondary | ICD-10-CM

## 2017-07-23 DIAGNOSIS — R319 Hematuria, unspecified: Secondary | ICD-10-CM

## 2017-07-23 DIAGNOSIS — M8580 Other specified disorders of bone density and structure, unspecified site: Secondary | ICD-10-CM

## 2017-07-23 LAB — BASIC METABOLIC PANEL
BUN: 13 mg/dL (ref 6–23)
CALCIUM: 9.9 mg/dL (ref 8.4–10.5)
CO2: 32 mEq/L (ref 19–32)
Chloride: 103 mEq/L (ref 96–112)
Creatinine, Ser: 0.68 mg/dL (ref 0.40–1.20)
GFR: 94.17 mL/min (ref >60.00)
Glucose, Bld: 94 mg/dL (ref 70–99)
Potassium: 4.3 mEq/L (ref 3.5–5.1)
Sodium: 141 mEq/L (ref 135–145)

## 2017-07-23 LAB — CBC WITH DIFFERENTIAL/PLATELET
BASOS PCT: 0.3 % (ref 0.0–3.0)
Basophils Absolute: 0 10*3/uL (ref 0.0–0.1)
EOS ABS: 0.1 10*3/uL (ref 0.0–0.7)
Eosinophils Relative: 1 % (ref 0.0–5.0)
HEMATOCRIT: 40.8 % (ref 36.0–46.0)
HEMOGLOBIN: 13.4 g/dL (ref 12.0–15.0)
LYMPHS PCT: 33.4 % (ref 12.0–46.0)
Lymphs Abs: 2.3 10*3/uL (ref 0.7–4.0)
MCHC: 32.9 g/dL (ref 30.0–36.0)
MCV: 91.3 fl (ref 78.0–100.0)
Monocytes Absolute: 0.6 10*3/uL (ref 0.1–1.0)
Monocytes Relative: 9 % (ref 3.0–12.0)
Neutro Abs: 3.8 10*3/uL (ref 1.4–7.7)
Neutrophils Relative %: 56.3 % (ref 43.0–77.0)
Platelets: 212 10*3/uL (ref 150.0–400.0)
RBC: 4.47 Mil/uL (ref 3.87–5.11)
RDW: 13.9 % (ref 11.5–15.5)
WBC: 6.8 10*3/uL (ref 4.0–10.5)

## 2017-07-23 LAB — LIPID PANEL
CHOL/HDL RATIO: 3
Cholesterol: 227 mg/dL — ABNORMAL HIGH (ref 0–200)
HDL: 83.1 mg/dL (ref >39.00)
LDL Cholesterol: 134 mg/dL — ABNORMAL HIGH (ref 0–99)
NONHDL: 144.28
Triglycerides: 50 mg/dL (ref 0.0–149.0)
VLDL: 10 mg/dL (ref 0.0–40.0)

## 2017-07-23 LAB — POCT URINALYSIS DIPSTICK
Bilirubin, UA: NEGATIVE
GLUCOSE UA: NEGATIVE
Ketones, UA: NEGATIVE
Leukocytes, UA: NEGATIVE
Nitrite, UA: NEGATIVE
Protein, UA: NEGATIVE
Spec Grav, UA: 1.01 (ref 1.010–1.025)
Urobilinogen, UA: 0.2 E.U./dL
pH, UA: 5 (ref 5.0–8.0)

## 2017-07-23 LAB — HEPATIC FUNCTION PANEL
ALBUMIN: 4.6 g/dL (ref 3.5–5.2)
ALT: 13 U/L (ref 0–35)
AST: 17 U/L (ref 0–37)
Alkaline Phosphatase: 61 U/L (ref 39–117)
BILIRUBIN TOTAL: 0.4 mg/dL (ref 0.2–1.2)
Bilirubin, Direct: 0.1 mg/dL (ref 0.0–0.3)
Total Protein: 7 g/dL (ref 6.0–8.3)

## 2017-07-23 LAB — VITAMIN B12: VITAMIN B 12: 686 pg/mL (ref 211–911)

## 2017-07-23 LAB — VITAMIN D 25 HYDROXY (VIT D DEFICIENCY, FRACTURES): VITD: 57.52 ng/mL (ref 30.00–100.00)

## 2017-07-23 LAB — TSH: TSH: 0.32 u[IU]/mL — AB (ref 0.35–4.50)

## 2017-07-23 MED ORDER — BUPROPION HCL ER (XL) 150 MG PO TB24: ORAL_TABLET | ORAL | 6 refills | Status: DC

## 2017-07-23 MED ORDER — CLOBETASOL PROPIONATE 0.05 % EX CREA: 1.0000 "application " | TOPICAL_CREAM | Freq: Two times a day (BID) | CUTANEOUS | 1 refills | Status: DC

## 2017-07-23 NOTE — Assessment & Plan Note (Signed)
Pt's PE WNL.  UTD on colonoscopy.  DEXA ordered.  No need for mammo due to bilateral mastectomy.  UTD on immunizations.  Check labs.  Anticipatory guidance provided.

## 2017-07-23 NOTE — Assessment & Plan Note (Signed)
Chronic problem.  Due for DEXA- ordered.  Continue Ca and Vit D.  Will follow.

## 2017-07-23 NOTE — Assessment & Plan Note (Signed)
Pap collected. 

## 2017-07-23 NOTE — Assessment & Plan Note (Signed)
Chronic problem.  Continues to complain of hair loss despite normal labs.  Check labs.  Adjust meds prn

## 2017-07-23 NOTE — Assessment & Plan Note (Signed)
Deteriorated since switching to Trintellix.  Switch back to Wellbutrin as pt's mood, energy level, motivation have all deteriorated since switching meds.  Will follow closely.

## 2017-07-23 NOTE — Patient Instructions (Signed)
Follow up in 1 month to recheck mood We'll notify you of your lab results and make any changes if needed STOP the Trintellix RESTART the Wellbutrin We'll call you with your bone density appt Call with any questions or concerns Hang in there!!!

## 2017-07-23 NOTE — Progress Notes (Addendum)
   Subjective:    Patient ID: [name] [name], female    DOB: 1961-07-05, 57 y.o.   MRN: [id number]  HPI CPE- pt due for pap, DEXA, and Hep C screen.  UTD on flu and Tdap.  No need for mammo due to bilateral mastectomy.  Anxiety/depression- in October pt started Trintellix and since then has been 'really down', low energy, low motivation, not exercising.   Review of Systems Patient reports no vision/ hearing changes, adenopathy,fever, weight change,  persistant/recurrent hoarseness , swallowing issues, chest pain, palpitations, edema, persistant/recurrent cough, hemoptysis, dyspnea (rest/exertional/paroxysmal nocturnal), gastrointestinal bleeding (melena, rectal bleeding), abdominal pain, significant heartburn, bowel changes, GU symptoms (dysuria, hematuria, incontinence), Gyn symptoms (abnormal  bleeding, pain),  syncope, focal weakness, memory loss, numbness & tingling, skin/nail changes, abnormal bruising or bleeding.   + hair loss    Objective:   Physical Exam  General Appearance:    Alert, cooperative, no distress, appears stated age  Head:    Normocephalic, without obvious abnormality, atraumatic  Eyes:    PERRL, conjunctiva/corneas clear, EOM's intact, fundi    benign, both eyes  Ears:    Normal TM's and external ear canals, both ears  Nose:   Nares normal, septum midline, mucosa normal, no drainage    or sinus tenderness  Throat:   Lips, mucosa, and tongue normal; teeth and gums normal  Neck:   Supple, symmetrical, trachea midline, no adenopathy;    Thyroid: no enlargement/tenderness/nodules  Back:     Symmetric, no curvature, ROM normal, no CVA tenderness  Lungs:     Clear to auscultation bilaterally, respirations unlabored  Chest Wall:    No tenderness or deformity   Heart:    Regular rate and rhythm, S1 and S2 normal, no murmur, rub   or gallop  Breast Exam:    Implants bilaterally, no masses  Abdomen:     Soft, non-tender, bowel sounds active all four quadrants,    no  masses, no organomegaly  Genitalia:    External genitalia normal, cervix normal in appearance, no CMT, uterus in normal size and position, adnexa w/out mass or tenderness, mucosa pink and moist, no lesions or discharge present  Rectal:    External skin tag  Extremities:   Extremities normal, atraumatic, no cyanosis or edema  Pulses:   2+ and symmetric all extremities  Skin:   Skin color, texture, turgor normal, no rashes or lesions  Lymph nodes:   Cervical, supraclavicular, and axillary nodes normal  Neurologic:   CNII-XII intact, normal strength, sensation and reflexes    throughout          Assessment & Plan:  Dysuria- pt reports previous UTI and has similar sensation.  Check UA, send for cx and tx if needed.

## 2017-07-23 NOTE — Assessment & Plan Note (Signed)
Check labs.  Replete prn. 

## 2017-07-24 ENCOUNTER — Encounter: Payer: Self-pay | Admitting: General Practice

## 2017-07-24 ENCOUNTER — Encounter: Payer: Self-pay | Admitting: Family Medicine

## 2017-07-24 LAB — CYTOLOGY - PAP
DIAGNOSIS: NEGATIVE
HPV (WINDOPATH): NOT DETECTED

## 2017-07-24 LAB — URINE CULTURE
MICRO NUMBER:: 90072482
RESULT: NO GROWTH
SPECIMEN QUALITY:: ADEQUATE

## 2017-08-04 ENCOUNTER — Encounter: Payer: Self-pay | Admitting: Family Medicine

## 2017-08-10 ENCOUNTER — Ambulatory Visit (INDEPENDENT_AMBULATORY_CARE_PROVIDER_SITE_OTHER)
Admission: RE | Admit: 2017-08-10 | Discharge: 2017-08-10 | Disposition: A | Discharge status: Home / Self Care | Payer: Federal, State, Local not specified - PPO | Source: Ambulatory Visit | Attending: Family Medicine | Admitting: Family Medicine

## 2017-08-10 DIAGNOSIS — M85852 Other specified disorders of bone density and structure, left thigh: Secondary | ICD-10-CM | POA: Diagnosis not present

## 2017-08-10 DIAGNOSIS — M8580 Other specified disorders of bone density and structure, unspecified site: Secondary | ICD-10-CM

## 2017-08-14 DIAGNOSIS — L2389 Allergic contact dermatitis due to other agents: Secondary | ICD-10-CM | POA: Diagnosis not present

## 2017-08-16 DIAGNOSIS — M85852 Other specified disorders of bone density and structure, left thigh: Secondary | ICD-10-CM | POA: Diagnosis not present

## 2017-08-17 ENCOUNTER — Encounter: Payer: Self-pay | Admitting: General Practice

## 2017-08-24 ENCOUNTER — Encounter: Payer: Self-pay | Admitting: Family Medicine

## 2017-08-24 ENCOUNTER — Other Ambulatory Visit: Payer: Self-pay

## 2017-08-24 ENCOUNTER — Ambulatory Visit: Payer: Federal, State, Local not specified - PPO | Admitting: Family Medicine

## 2017-08-24 VITALS — BP 126/86 | HR 98 | Resp 16 | Ht 62.0 in | Wt 138.4 lb

## 2017-08-24 DIAGNOSIS — F329 Major depressive disorder, single episode, unspecified: Secondary | ICD-10-CM

## 2017-08-24 DIAGNOSIS — F32A Depression, unspecified: Secondary | ICD-10-CM

## 2017-08-24 DIAGNOSIS — F419 Anxiety disorder, unspecified: Secondary | ICD-10-CM | POA: Diagnosis not present

## 2017-08-24 MED ORDER — BUPROPION HCL ER (XL) 300 MG PO TB24: 300.0000 mg | ORAL_TABLET | Freq: Every day | ORAL | 3 refills | Status: DC

## 2017-08-24 NOTE — Patient Instructions (Signed)
Follow up in 1 month to recheck mood Increase the Wellbutrin to 300mg  daily- 2 of what you have at home and 1 of the new prescription Use the Alprazolam as needed for panicked moments Continue to go to counseling- it's hard but it will really help! Call with any questions or concerns Hang in there!!!
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
## 2017-08-24 NOTE — Assessment & Plan Note (Signed)
Deteriorated.  Pt is distraught about her husband's affairs, her marriage falling apart, and the idea of being alone.  She is in counseling but not managing well.  Denies SI/HI.  Increase Wellbutrin to 300mg  daily.  Continue to use Alprazolam as needed.  Will follow closely.

## 2017-09-21 ENCOUNTER — Other Ambulatory Visit: Payer: Self-pay

## 2017-09-21 ENCOUNTER — Encounter: Payer: Self-pay | Admitting: Family Medicine

## 2017-09-21 ENCOUNTER — Ambulatory Visit: Payer: Federal, State, Local not specified - PPO | Admitting: Family Medicine

## 2017-09-21 VITALS — BP 123/83 | HR 85 | Temp 98.0°F | Resp 16 | Ht 62.0 in | Wt 139.4 lb

## 2017-09-21 DIAGNOSIS — F329 Major depressive disorder, single episode, unspecified: Secondary | ICD-10-CM | POA: Diagnosis not present

## 2017-09-21 DIAGNOSIS — F419 Anxiety disorder, unspecified: Secondary | ICD-10-CM | POA: Diagnosis not present

## 2017-09-21 DIAGNOSIS — F32A Depression, unspecified: Secondary | ICD-10-CM

## 2017-09-21 NOTE — Patient Instructions (Addendum)
Follow up in July to recheck cholesterol No med changes at this time Continue the Wellbutrin 300mg  daily and the Alprazolam as needed Keep protecting you!!! Call with any questions or concerns Hang in there!!!
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
## 2017-09-24 ENCOUNTER — Other Ambulatory Visit: Payer: Self-pay | Admitting: Family Medicine

## 2017-09-24 NOTE — Telephone Encounter (Signed)
Last OV 09/21/2017  Last filled 04/10/2017, 30 pills with 3 refills  Please advise if okay to refill? I do not see this prescription mentioned in past office notes. Thank you!

## 2017-09-25 ENCOUNTER — Telehealth: Payer: Self-pay | Admitting: Family Medicine

## 2017-09-25 NOTE — Telephone Encounter (Signed)
Copied from [place]. Topic: Quick Communication - Rx Refill/Question >> Sep 25, 2017 10:40 AM Oliver Pila B wrote: Medication: valACYclovir (VALTREX) 500 MG tablet [[id number]]  Has the patient contacted their pharmacy? Yes.   (Agent: If no, request that the patient contact the pharmacy for the refill.) Preferred Pharmacy (with phone number or street name): [facility] Agent: Please be advised that RX refills may take up to 3 business days. We ask that you follow-up with your pharmacy.

## 2017-10-03 DIAGNOSIS — S4992XA Unspecified injury of left shoulder and upper arm, initial encounter: Secondary | ICD-10-CM | POA: Diagnosis not present

## 2017-10-03 DIAGNOSIS — W19XXXA Unspecified fall, initial encounter: Secondary | ICD-10-CM | POA: Diagnosis not present

## 2017-10-03 DIAGNOSIS — M25512 Pain in left shoulder: Secondary | ICD-10-CM | POA: Diagnosis not present

## 2017-10-03 DIAGNOSIS — M25522 Pain in left elbow: Secondary | ICD-10-CM | POA: Diagnosis not present

## 2017-10-03 DIAGNOSIS — M25532 Pain in left wrist: Secondary | ICD-10-CM | POA: Diagnosis not present

## 2017-10-03 DIAGNOSIS — S59902A Unspecified injury of left elbow, initial encounter: Secondary | ICD-10-CM | POA: Diagnosis not present

## 2017-10-10 ENCOUNTER — Other Ambulatory Visit: Payer: Self-pay | Admitting: Family Medicine

## 2017-10-12 NOTE — Telephone Encounter (Signed)
Last OV 09/21/2017, Next OV 01/19/2018 Last filled 06/17/2017, #60 with 3 refills

## 2017-10-14 DIAGNOSIS — E890 Postprocedural hypothyroidism: Secondary | ICD-10-CM | POA: Diagnosis not present

## 2017-10-26 ENCOUNTER — Other Ambulatory Visit: Payer: Self-pay | Admitting: Family Medicine

## 2017-11-09 ENCOUNTER — Other Ambulatory Visit: Payer: Self-pay | Admitting: Family Medicine

## 2017-11-10 NOTE — Telephone Encounter (Signed)
Last OV 09/21/17 Alprazolam last filled 10/12/17 #60 with 0

## 2017-11-26 ENCOUNTER — Other Ambulatory Visit: Payer: Self-pay | Admitting: Family Medicine

## 2017-12-08 DIAGNOSIS — H2513 Age-related nuclear cataract, bilateral: Secondary | ICD-10-CM | POA: Diagnosis not present

## 2017-12-08 DIAGNOSIS — H02831 Dermatochalasis of right upper eyelid: Secondary | ICD-10-CM | POA: Diagnosis not present

## 2017-12-08 DIAGNOSIS — H02834 Dermatochalasis of left upper eyelid: Secondary | ICD-10-CM | POA: Diagnosis not present

## 2017-12-15 ENCOUNTER — Ambulatory Visit (INDEPENDENT_AMBULATORY_CARE_PROVIDER_SITE_OTHER): Payer: Federal, State, Local not specified - PPO

## 2017-12-15 ENCOUNTER — Other Ambulatory Visit: Payer: Self-pay

## 2017-12-15 ENCOUNTER — Encounter: Payer: Self-pay | Admitting: Family Medicine

## 2017-12-15 ENCOUNTER — Ambulatory Visit: Payer: Federal, State, Local not specified - PPO | Admitting: Family Medicine

## 2017-12-15 ENCOUNTER — Ambulatory Visit (INDEPENDENT_AMBULATORY_CARE_PROVIDER_SITE_OTHER): Payer: Federal, State, Local not specified - PPO | Admitting: Orthopaedic Surgery

## 2017-12-15 VITALS — BP 98/60 | HR 91 | Temp 98.0°F | Resp 16 | Ht 62.0 in | Wt 140.4 lb

## 2017-12-15 DIAGNOSIS — M25532 Pain in left wrist: Secondary | ICD-10-CM

## 2017-12-15 NOTE — Progress Notes (Signed)
   Subjective:    Patient ID: [name] [name], female    DOB: [birth year], 57 y.o.   MRN: [id number]  HPI Wrist pain- initially injured L wrist in January, 'pt pulled and twisted it'.  She filed a report but never had it looked at.  Last night was helping to lift a pt and 'it hurt really bad' and swelled immediately.  No relief w/ ice.  Painful to move.  Most painful on ulnar side.  Pain w/ wrist flexion but is having constant pain at rest.  Will be a Worker's Comp claim- paperwork filed last night.  Reports no restrictions on where she can seek care.  Has been alternating 600mg  ibuprofen w/ tylenol.  Pt is R hand dominant   Review of Systems For ROS see HPI     Objective:   Physical Exam  Constitutional: She appears well-developed and well-nourished. No distress.  Cardiovascular: Intact distal pulses.  Musculoskeletal: She exhibits edema (mild swelling over ulnar aspect of wrist and forearm). She exhibits no deformity.  No pain w/ L wrist extension but pain w/ flexion.  Unable to perform pincher grasp.  No pain w/ radial deviation, pain w/ ulnar deviation  Vitals reviewed.         Assessment & Plan:  L wrist pain- new.  Pt had acute injury last night at work.  She has completed Worker's Comp paperwork but did not bring those today.  She indicates there is no restriction on where she is able to go for care.  Given pain and limited ROM, will place in wrist brace and refer to Ortho.  No apparent bony injury- appears to be ligamentous.  Reviewed supportive care and red flags that should prompt return.  Pt expressed understanding and is in agreement w/ plan.

## 2017-12-15 NOTE — Patient Instructions (Signed)
Follow up as needed or as scheduled We are working on getting you a same day appt for the wrist Continue the Ibuprofen and tylenol- alternating every 4 hrs Wear the brace until told otherwise Call with any questions or concerns Hang in there!!
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
## 2017-12-16 DIAGNOSIS — E890 Postprocedural hypothyroidism: Secondary | ICD-10-CM | POA: Diagnosis not present

## 2017-12-21 ENCOUNTER — Telehealth (INDEPENDENT_AMBULATORY_CARE_PROVIDER_SITE_OTHER): Payer: Self-pay | Admitting: Orthopaedic Surgery

## 2017-12-21 NOTE — Telephone Encounter (Signed)
Patient said that her wrist brace isn't keeping her wrist stable enough and she was wondering if she should come in to be seen or if she should get another brace. CB # [phone]

## 2017-12-21 NOTE — Telephone Encounter (Signed)
See message below °

## 2017-12-22 NOTE — Telephone Encounter (Signed)
She will stop by to get wrist brace

## 2017-12-22 NOTE — Telephone Encounter (Signed)
That's fine, she can come in to get the brace evaluated

## 2017-12-23 NOTE — Telephone Encounter (Signed)
Patient picked up brace yesterday.

## 2017-12-24 ENCOUNTER — Encounter (INDEPENDENT_AMBULATORY_CARE_PROVIDER_SITE_OTHER): Payer: Self-pay

## 2017-12-24 ENCOUNTER — Telehealth (INDEPENDENT_AMBULATORY_CARE_PROVIDER_SITE_OTHER): Payer: Self-pay

## 2017-12-24 NOTE — Telephone Encounter (Signed)
Ok to write work note for no pushing, pulling, lifting of left hand until fu appt with [place] after mr arthrogram

## 2017-12-24 NOTE — Telephone Encounter (Signed)
What is patient's work status?  

## 2017-12-24 NOTE — Telephone Encounter (Signed)
Note made.  

## 2017-12-25 ENCOUNTER — Telehealth (INDEPENDENT_AMBULATORY_CARE_PROVIDER_SITE_OTHER): Payer: Self-pay

## 2017-12-25 NOTE — Telephone Encounter (Signed)
Faxed the completed dept of labor attending physicians report to April Barrier @ pts employer ([employer]) [phone]

## 2017-12-25 NOTE — Telephone Encounter (Signed)
fyi-per April Barrier @ [employer] pt has a CA-16 on file which covers any diagnostic imaging studies including MRI, OV/ER visits, braces, splints, casts, canes, TENS units, prescriptions, PT, and hospitalizations. Will not cover: Surgery, home exercise equipment, whirlpools, or mattresses, spa/gym membership, and work hardening programs Ph for April is [phone]

## 2017-12-31 ENCOUNTER — Other Ambulatory Visit: Payer: Self-pay | Admitting: Family Medicine

## 2018-01-05 DIAGNOSIS — H02831 Dermatochalasis of right upper eyelid: Secondary | ICD-10-CM | POA: Diagnosis not present

## 2018-01-05 DIAGNOSIS — H02834 Dermatochalasis of left upper eyelid: Secondary | ICD-10-CM | POA: Diagnosis not present

## 2018-01-07 ENCOUNTER — Other Ambulatory Visit: Payer: Self-pay | Admitting: Family Medicine

## 2018-01-08 ENCOUNTER — Ambulatory Visit
Admission: RE | Admit: 2018-01-08 | Discharge: 2018-01-08 | Disposition: A | Discharge status: Home / Self Care | Payer: Worker's Compensation | Source: Ambulatory Visit | Attending: Physician Assistant | Admitting: Physician Assistant

## 2018-01-08 ENCOUNTER — Other Ambulatory Visit: Payer: Self-pay | Admitting: Family Medicine

## 2018-01-08 DIAGNOSIS — M25532 Pain in left wrist: Secondary | ICD-10-CM

## 2018-01-08 MED ORDER — IOPAMIDOL (ISOVUE-M 200) INJECTION 41%
2.0000 mL | Freq: Once | INTRAMUSCULAR | Status: AC
  Administered 2018-01-08: 2 mL via INTRA_ARTICULAR

## 2018-01-08 NOTE — Telephone Encounter (Signed)
Last OV 12/15/17, Next OV 01/19/18  Last filled 11/10/17, # 60 with 0 refill

## 2018-01-11 ENCOUNTER — Encounter (INDEPENDENT_AMBULATORY_CARE_PROVIDER_SITE_OTHER): Payer: Self-pay | Admitting: Orthopaedic Surgery

## 2018-01-11 ENCOUNTER — Ambulatory Visit (INDEPENDENT_AMBULATORY_CARE_PROVIDER_SITE_OTHER): Admitting: Orthopaedic Surgery

## 2018-01-11 DIAGNOSIS — M25532 Pain in left wrist: Secondary | ICD-10-CM

## 2018-01-11 NOTE — Progress Notes (Signed)
Fu to discuss scan

## 2018-01-11 NOTE — Progress Notes (Signed)
Patient is a pleasant 57 year old female who presents to our clinic today to discuss MR arthrogram results of the left wrist.  Imaging from 01/08/2018 shows thinning of the TFCC but no tear identified.  The patient has been in a removable splint over the past several weeks and has been doing dramatically better.  She has been at work working light duty without any issues.  She has no pain and no tenderness to the motion.  She has full range of motion.  At this point, we will allow the patient to return to work full duty without restrictions.  She will follow-up with [place] as needed.  Call with concerns or questions in the meantime.

## 2018-01-12 ENCOUNTER — Ambulatory Visit (INDEPENDENT_AMBULATORY_CARE_PROVIDER_SITE_OTHER): Payer: Self-pay | Admitting: Orthopaedic Surgery

## 2018-01-19 ENCOUNTER — Ambulatory Visit: Payer: Federal, State, Local not specified - PPO | Admitting: Family Medicine

## 2018-01-19 ENCOUNTER — Telehealth (INDEPENDENT_AMBULATORY_CARE_PROVIDER_SITE_OTHER): Payer: Self-pay

## 2018-01-19 ENCOUNTER — Encounter: Payer: Self-pay | Admitting: Family Medicine

## 2018-01-19 ENCOUNTER — Other Ambulatory Visit: Payer: Self-pay

## 2018-01-19 VITALS — BP 112/68 | HR 75 | Temp 97.7°F | Resp 16 | Ht 62.0 in | Wt 138.0 lb

## 2018-01-19 DIAGNOSIS — F329 Major depressive disorder, single episode, unspecified: Secondary | ICD-10-CM | POA: Diagnosis not present

## 2018-01-19 DIAGNOSIS — E785 Hyperlipidemia, unspecified: Secondary | ICD-10-CM | POA: Diagnosis not present

## 2018-01-19 DIAGNOSIS — F32A Depression, unspecified: Secondary | ICD-10-CM

## 2018-01-19 DIAGNOSIS — F419 Anxiety disorder, unspecified: Secondary | ICD-10-CM

## 2018-01-19 LAB — HEPATIC FUNCTION PANEL
ALT: 14 U/L (ref 0–35)
AST: 16 U/L (ref 0–37)
Albumin: 4.6 g/dL (ref 3.5–5.2)
Alkaline Phosphatase: 67 U/L (ref 39–117)
BILIRUBIN TOTAL: 0.3 mg/dL (ref 0.2–1.2)
Bilirubin, Direct: 0.1 mg/dL (ref 0.0–0.3)
Total Protein: 7.1 g/dL (ref 6.0–8.3)

## 2018-01-19 LAB — BASIC METABOLIC PANEL
BUN: 15 mg/dL (ref 6–23)
CALCIUM: 10.1 mg/dL (ref 8.4–10.5)
CO2: 33 mEq/L — ABNORMAL HIGH (ref 19–32)
Chloride: 103 mEq/L (ref 96–112)
Creatinine, Ser: 0.72 mg/dL (ref 0.40–1.20)
GFR: 88.01 mL/min (ref >60.00)
Glucose, Bld: 85 mg/dL (ref 70–99)
POTASSIUM: 4.9 meq/L (ref 3.5–5.1)
Sodium: 141 mEq/L (ref 135–145)

## 2018-01-19 LAB — LIPID PANEL
CHOL/HDL RATIO: 3
Cholesterol: 209 mg/dL — ABNORMAL HIGH (ref 0–200)
HDL: 74.6 mg/dL (ref >39.00)
LDL CALC: 120 mg/dL — AB (ref 0–99)
NonHDL: 134.11
TRIGLYCERIDES: 71 mg/dL (ref 0.0–149.0)
VLDL: 14.2 mg/dL (ref 0.0–40.0)

## 2018-01-19 NOTE — Telephone Encounter (Signed)
Faxed the 01/11/18 office note and work note per her request
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
## 2018-01-19 NOTE — Assessment & Plan Note (Signed)
Chronic problem.  Pt is attempting to control w/ healthy diet.  Not exercising.  No longer on Fish Oil.  Check labs.  Determine next steps based on results

## 2018-01-19 NOTE — Patient Instructions (Signed)
Schedule your complete physical for January We'll notify you of your lab results and make any changes if needed Keep up the good work!  You look great!!! Call with any questions or concerns GOOD LUCK WITH SURGERY!!!

## 2018-01-20 ENCOUNTER — Encounter: Payer: Self-pay | Admitting: General Practice

## 2018-01-28 ENCOUNTER — Other Ambulatory Visit: Payer: Self-pay | Admitting: Family Medicine

## 2018-01-28 ENCOUNTER — Other Ambulatory Visit: Payer: Self-pay

## 2018-01-28 ENCOUNTER — Encounter: Payer: Self-pay | Admitting: Physician Assistant

## 2018-01-28 ENCOUNTER — Ambulatory Visit: Payer: Federal, State, Local not specified - PPO | Admitting: Physician Assistant

## 2018-01-28 ENCOUNTER — Ambulatory Visit (INDEPENDENT_AMBULATORY_CARE_PROVIDER_SITE_OTHER): Payer: Federal, State, Local not specified - PPO

## 2018-01-28 VITALS — BP 98/62 | HR 89 | Temp 98.1°F | Resp 16 | Ht 62.0 in | Wt 137.8 lb

## 2018-01-28 DIAGNOSIS — R1084 Generalized abdominal pain: Secondary | ICD-10-CM

## 2018-01-28 DIAGNOSIS — N899 Noninflammatory disorder of vagina, unspecified: Secondary | ICD-10-CM | POA: Diagnosis not present

## 2018-01-28 DIAGNOSIS — R110 Nausea: Secondary | ICD-10-CM | POA: Diagnosis not present

## 2018-01-28 DIAGNOSIS — R350 Frequency of micturition: Secondary | ICD-10-CM

## 2018-01-28 DIAGNOSIS — N368 Other specified disorders of urethra: Secondary | ICD-10-CM | POA: Diagnosis not present

## 2018-01-28 DIAGNOSIS — N898 Other specified noninflammatory disorders of vagina: Secondary | ICD-10-CM

## 2018-01-28 LAB — POCT URINALYSIS DIPSTICK
Bilirubin, UA: NEGATIVE
Glucose, UA: NEGATIVE
Ketones, UA: NEGATIVE
Leukocytes, UA: NEGATIVE
Nitrite, UA: NEGATIVE
PH UA: 6 (ref 5.0–8.0)
PROTEIN UA: NEGATIVE
SPEC GRAV UA: 1.01 (ref 1.010–1.025)
UROBILINOGEN UA: 0.2 U/dL

## 2018-01-28 MED ORDER — PROMETHAZINE HCL 25 MG PO TABS: 25.0000 mg | ORAL_TABLET | Freq: Three times a day (TID) | ORAL | 0 refills | Status: DC | PRN

## 2018-01-28 MED ORDER — MIRABEGRON ER 25 MG PO TB24: 25.0000 mg | ORAL_TABLET | Freq: Every day | ORAL | 0 refills | Status: DC

## 2018-01-28 NOTE — Progress Notes (Signed)
Patient presents to clinic today c/o 6 months of urinary urgency and intermittent nausea. Had prior assessment in January for potential UTI that was negative. Has had ongoing symptoms since then that have been mild. As of Sunday she notes some increased urgency and suprapubic/pelvic pressure. Also notes some more consistent nausea without vomiting. Denies fever, chills, back pain. Has history of IBS and is noting abdominal bloating and cramping associated with increased stressors and anxiety over urinary symptoms. Notes that she has constipation most of the time for which she takes Miralax as needed. Notes last BM was 48 hours ago but small and loose in amount. Last normal evacuation was 6 days ago per patient. Is passing gas without issue.  Patient notes vaginal pressure and pain. Is not sexually active and has not been in several years per patient.   Past Medical History:  Diagnosis Date  . Allergy   . Anxiety    takes Xanax prn  . Asthma    dx yrs ago but no problems in > 15 yrs  . Back pain   . Back pain    pinched nerve  . Breast cancer ([place]) 2013   left breast  . Chronic constipation   . Common migraine with intractable migraine 02/12/2016  . Depression    takes Wellbutrin  . Diverticulitis   . Genetic testing 11/03/2016   Ms. Cato underwent genetic counseling and testing for hereditary cancer syndromes on 10/23/2016. Her results were negative for mutations in all 46 genes analyzed by [facility]'s 46-gene Common Hereditary Cancers Panel. Genes analyzed include: APC, ATM, AXIN2, BARD1, BMPR1A, BRCA1, BRCA2, BRIP1, CDH1, CDKN2A, CHEK2, CTNNA1, DICER1, EPCAM, GREM1, HOXB13, KIT, MEN1, MLH1, MSH2, MSH3, MSH6, MUTYH, NB  . History of blood transfusion    no abnormal reaction noted  . History of shingles   . Hypothyroidism    Graves Disease;takes Synthroid daily  . Inflammatory bowel disease (ulcerative colitis) ([place])   . Multiple allergies    takes Zyrtec nightly    Current  Outpatient Medications on File Prior to Visit  Medication Sig Dispense Refill  . ALPRAZolam (XANAX) 0.5 MG tablet TAKE 1 TABLET(0.5 MG) BY MOUTH TWICE DAILY AS NEEDED FOR ANXIETY 60 tablet 0  . Ascorbic Acid (VITAMIN C PO) Take 2,000 mg by mouth daily.    . Biotin 10 MG TABS Take by mouth daily.    . Calcium Carbonate-Vitamin D (CALTRATE 600+D) 600-400 MG-UNIT per tablet Take 1 tablet by mouth 2 (two) times daily.    . cetirizine (ZYRTEC) 10 MG tablet Take 10 mg by mouth daily.    . Cholecalciferol (VITAMIN D PO) Take 800 mcg by mouth daily.    Marland Kitchen EVENING PRIMROSE OIL PO Take by mouth.    . halobetasol (ULTRAVATE) 0.05 % ointment APP EXT AA BID  0  . Iron Combinations (IRON COMPLEX PO) Take 65 mg by mouth every other day.    . Multiple Vitamins-Minerals (MULTIVITAMIN ADULTS 50+ PO) Take 1 tablet by mouth daily.    Marland Kitchen neomycin-polymyxin-dexameth (MAXITROL) 0.1 % OINT Apply to affected area nightly    . SYNTHROID 112 MCG tablet   5  . valACYclovir (VALTREX) 500 MG tablet TAKE 1 TABLET(500 MG) BY MOUTH DAILY 30 tablet 0  . clobetasol cream (TEMOVATE) 2.29 % Apply 1 application topically 2 (two) times daily. (Patient not taking: Reported on 12/15/2017) 60 g 1  . Omega-3 Fatty Acids (FISH OIL) 1200 MG CAPS Take by mouth.    Marland Kitchen OVER THE COUNTER  MEDICATION Viviscal tablet, Tru biotics, vital proteins Collagen peptides    . polyethylene glycol (MIRALAX / GLYCOLAX) packet Take by mouth.    . Zinc 50 MG CAPS Take 1 capsule by mouth daily.     No current facility-administered medications on file prior to visit.     Allergies  Allergen Reactions  . Dilaudid [Hydromorphone Hcl] Nausea Only and Rash  . Hydromorphone Hcl Nausea Only and Rash  . Aspirin Nausea And Vomiting      stomach upset    Family History  Problem Relation Age of Onset  . Heart disease Mother   . Diabetes Mother   . Hyperlipidemia Mother   . Stroke Mother   . Cirrhosis Mother   . Hepatitis C Mother   . Liver cancer Mother         d.64  . Lung cancer Father 79       d.70  . Colon cancer Father 38  . Heart disease Sister   . Diabetes Sister   . Diabetes Brother   . Heart disease Brother   . Heart disease Sister   . Diabetes Sister   . Thyroid cancer Maternal Aunt 40  . Breast cancer Maternal Grandmother 40       d.57 dx'ed at 45.  Marland Kitchen Pancreatic cancer Maternal Grandmother        dx'ed after breast cancer.  . Lung cancer Paternal Uncle 52       d.62  . Lung cancer Maternal Grandfather        d.68s  . Cancer Paternal Grandmother        d.40s - unspecified cancer type  . Lung cancer Paternal Uncle        d.60s  . Pancreatic cancer Paternal Uncle     Social History   Socioeconomic History  . Marital status: Married    Spouse name: Not on file  . Number of children: 3  . Years of education: College  . Highest education level: Not on file  Occupational History  . Occupation: [employer]    Comment: [employer]  Social Needs  . Financial resource strain: Not on file  . Food insecurity:    Worry: Not on file    Inability: Not on file  . Transportation needs:    Medical: Not on file    Non-medical: Not on file  Tobacco Use  . Smoking status: Former Smoker    Last attempt to quit: 07/13/1991    Years since quitting: 26.5  . Smokeless tobacco: Never Used  . Tobacco comment: quit smoking in 1992  Substance and Sexual Activity  . Alcohol use: No    Alcohol/week: 0.0 oz    Comment: occasionally  . Drug use: No  . Sexual activity: Yes    Birth control/protection: Post-menopausal  Lifestyle  . Physical activity:    Days per week: Not on file    Minutes per session: Not on file  . Stress: Not on file  Relationships  . Social connections:    Talks on phone: Not on file    Gets together: Not on file    Attends religious service: Not on file    Active member of club or organization: Not on file    Attends meetings of clubs or organizations: Not on file    Relationship status: Not on file  Other  Topics Concern  . Not on file  Social History Narrative   Lives with husband in a 2 story home.  Has 2 children.  Works as a Merchandiser, retail.     Right-handed   Caffeine: 2 cups per day   Review of Systems - See HPI.  All other ROS are negative.  BP 98/62   Pulse 89   Temp 98.1 F (36.7 C) (Oral)   Resp 16   Ht '5\' 2"'$  (1.575 m)   Wt 137 lb 12.8 oz (62.5 kg)   SpO2 99%   BMI 25.20 kg/m   Physical Exam  Constitutional: She appears well-developed and well-nourished.  HENT:  Head: Normocephalic and atraumatic.  Eyes: Conjunctivae are normal.  Neck: Neck supple.  Cardiovascular: Normal rate, regular rhythm, normal heart sounds and intact distal pulses.  Pulmonary/Chest: Effort normal and breath sounds normal. No stridor. No respiratory distress. She has no wheezes. She has no rales. She exhibits no tenderness.  Abdominal: Soft. Bowel sounds are normal. She exhibits no distension and no mass. There is generalized tenderness. There is no rebound and no guarding. No hernia.  Genitourinary:    Pelvic exam was performed with patient supine. There is no rash, tenderness or lesion on the right labia. There is no rash, tenderness or lesion on the left labia.  Genitourinary Comments: Chaperone present for entirety of examination. Vaginal atrophy noted on external exam. No noted uterine prolapse, cystocele or rectocele on examination. There is an area of posterior vaginal wall were there is a firm region without definitive mass noted. There is hard fecal material noted in rectal vault.  Vitals reviewed.   Recent Results (from the past 2160 hour(s))  Lipid panel     Status: Abnormal   Collection Time: 01/19/18 10:35 AM  Result Value Ref Range   Cholesterol 209 (H) 0 - 200 mg/dL    Comment: ATP III Classification       Desirable:  < 200 mg/dL               Borderline High:  200 - 239 mg/dL          High:  > = 240 mg/dL   Triglycerides 71.0 0.0 - 149.0 mg/dL    Comment: Normal:  <150  mg/dLBorderline High:  150 - 199 mg/dL   HDL 74.60 >39.00 mg/dL   VLDL 14.2 0.0 - 40.0 mg/dL   LDL Cholesterol 120 (H) 0 - 99 mg/dL   Total CHOL/HDL Ratio 3     Comment:                Men          Women1/2 Average Risk     3.4          3.3Average Risk          5.0          4.42X Average Risk          9.6          7.13X Average Risk          15.0          11.0                       NonHDL 134.11     Comment: NOTE:  Non-HDL goal should be 30 mg/dL higher than patient's LDL goal (i.e. LDL goal of < 70 mg/dL, would have non-HDL goal of < 100 mg/dL)  Basic metabolic panel     Status: Abnormal   Collection Time: 01/19/18 10:35 AM  Result Value Ref Range   Sodium  141 135 - 145 mEq/L   Potassium 4.9 3.5 - 5.1 mEq/L   Chloride 103 96 - 112 mEq/L   CO2 33 (H) 19 - 32 mEq/L   Glucose, Bld 85 70 - 99 mg/dL   BUN 15 6 - 23 mg/dL   Creatinine, Ser 0.72 0.40 - 1.20 mg/dL   Calcium 10.1 8.4 - 10.5 mg/dL   GFR 88.01 >60.00 mL/min  Hepatic function panel     Status: None   Collection Time: 01/19/18 10:35 AM  Result Value Ref Range   Total Bilirubin 0.3 0.2 - 1.2 mg/dL   Bilirubin, Direct 0.1 0.0 - 0.3 mg/dL   Alkaline Phosphatase 67 39 - 117 U/L   AST 16 0 - 37 U/L   ALT 14 0 - 35 U/L   Total Protein 7.1 6.0 - 8.3 g/dL   Albumin 4.6 3.5 - 5.2 g/dL  POCT Urinalysis Dipstick     Status: Abnormal   Collection Time: 01/28/18  9:23 AM  Result Value Ref Range   Color, UA yellow    Clarity, UA clear    Glucose, UA Negative Negative   Bilirubin, UA negative    Ketones, UA negative    Spec Grav, UA 1.010 1.010 - 1.025   Blood, UA trace    pH, UA 6.0 5.0 - 8.0   Protein, UA Negative Negative   Urobilinogen, UA 0.2 0.2 or 1.0 E.U./dL   Nitrite, UA negative    Leukocytes, UA Negative Negative   Appearance     Odor      Assessment/Plan: 1. Frequent urination 2. Urethral prolapse 3. Vaginal mass (?) Urine dip unremarkable. Suspect symptoms are multifactorial. There may be underlying OAB but  constipation and fecalith are likely contributing to pressure on the bladder causing urgency and frequency. The urethral prolapse is also clearly contributing to symptoms. Concerning region in the posterior vaginal wall, unable to visualize in office but noted on examination. Giving multitude of symptoms and issues and need for proper management, an urgent referral has been placed to Urogyn. Will consider starting Myrbetriq to help with urgency symptoms until her assessment.  - Ambulatory referral to Urogynecology  4. Generalized abdominal pain Secondary to IBS-C. Will check x-ray to assess stool burden. Bowel regimen reviewed. She is to restart this as soon as she is home. Follow-up if no BM in 48 hours.  - DG Abd 1 View; Future   Leeanne Rio, PA-C

## 2018-01-28 NOTE — Patient Instructions (Addendum)
Please keep well-hydrated and get plenty of rest.  Avoid caffeine that can irritate your bladder. Start the miralax daily to help empty the bowels. I do feel the constipation is contributing to current symptoms.   I am setting you up with a Urogynecologist for further assessment and management.  We will alter regimen based on culture results. I am starting you on a medication to help with urgency while we are getting the cause taken care of.

## 2018-01-31 LAB — URINE CULTURE
MICRO NUMBER:: 90883834
SPECIMEN QUALITY:: ADEQUATE

## 2018-02-01 DIAGNOSIS — R102 Pelvic and perineal pain: Secondary | ICD-10-CM | POA: Diagnosis not present

## 2018-02-01 DIAGNOSIS — Z853 Personal history of malignant neoplasm of breast: Secondary | ICD-10-CM | POA: Diagnosis not present

## 2018-02-02 ENCOUNTER — Other Ambulatory Visit: Payer: Self-pay

## 2018-02-02 ENCOUNTER — Other Ambulatory Visit: Payer: Self-pay | Admitting: Family Medicine

## 2018-02-02 MED ORDER — CEPHALEXIN 500 MG PO CAPS: 500.0000 mg | ORAL_CAPSULE | Freq: Two times a day (BID) | ORAL | 0 refills | Status: DC

## 2018-02-03 DIAGNOSIS — F418 Other specified anxiety disorders: Secondary | ICD-10-CM | POA: Diagnosis not present

## 2018-02-03 DIAGNOSIS — H02831 Dermatochalasis of right upper eyelid: Secondary | ICD-10-CM | POA: Diagnosis not present

## 2018-02-03 DIAGNOSIS — Z87891 Personal history of nicotine dependence: Secondary | ICD-10-CM | POA: Diagnosis not present

## 2018-02-03 DIAGNOSIS — H02834 Dermatochalasis of left upper eyelid: Secondary | ICD-10-CM | POA: Diagnosis not present

## 2018-02-03 DIAGNOSIS — Z886 Allergy status to analgesic agent status: Secondary | ICD-10-CM | POA: Diagnosis not present

## 2018-02-04 ENCOUNTER — Other Ambulatory Visit: Payer: Self-pay | Admitting: Family Medicine

## 2018-02-04 NOTE — Telephone Encounter (Signed)
Last OV 01/28/18 Alprazolam last filled 01/08/18 #60 with 0

## 2018-02-12 DIAGNOSIS — R351 Nocturia: Secondary | ICD-10-CM | POA: Diagnosis not present

## 2018-02-12 DIAGNOSIS — R8271 Bacteriuria: Secondary | ICD-10-CM | POA: Diagnosis not present

## 2018-02-12 DIAGNOSIS — R350 Frequency of micturition: Secondary | ICD-10-CM | POA: Diagnosis not present

## 2018-02-19 ENCOUNTER — Encounter

## 2018-02-22 ENCOUNTER — Ambulatory Visit: Payer: Federal, State, Local not specified - PPO | Admitting: Family Medicine

## 2018-02-22 ENCOUNTER — Encounter: Payer: Self-pay | Admitting: Family Medicine

## 2018-02-22 ENCOUNTER — Other Ambulatory Visit: Payer: Self-pay

## 2018-02-22 VITALS — BP 130/81 | HR 101 | Temp 98.1°F | Resp 16 | Ht 62.0 in | Wt 143.4 lb

## 2018-02-22 DIAGNOSIS — F329 Major depressive disorder, single episode, unspecified: Secondary | ICD-10-CM

## 2018-02-22 DIAGNOSIS — F419 Anxiety disorder, unspecified: Secondary | ICD-10-CM | POA: Diagnosis not present

## 2018-02-22 DIAGNOSIS — F32A Depression, unspecified: Secondary | ICD-10-CM

## 2018-02-22 MED ORDER — CITALOPRAM HYDROBROMIDE 20 MG PO TABS: 20.0000 mg | ORAL_TABLET | Freq: Every day | ORAL | 3 refills | Status: DC

## 2018-02-22 NOTE — Assessment & Plan Note (Signed)
Deteriorated.  Pt is very stressed with all that is going on.  This is understandable but now it is starting to impact her ability to do her job.  She fears that if she makes a mistake she will be fired and she cannot afford that at this time.  FMLA forms provided for pt indicating 1-2 days/week for 1 day at a time she can be out if needed.  Restart Celexa.  Continue Wellbutrin.  Will follow closely.

## 2018-02-22 NOTE — Progress Notes (Signed)
   Subjective:    Patient ID: [name] [name], female    DOB: 09/19/1960, 57 y.o.   MRN: [id number]  HPI Anxiety/depression- pt is on Wellbutrin 300mg  daily and Alprazolam 0.5mg  prn.  Pt has been under considerable stress w/ her husband's recent cheating and his financial issues.  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  Pt works as Therapist, sports and she feels she is unable to concentrate and fears the possibility of medication errors.  She is not sleeping.  Pt has 5 civil summons against her due to husband's issues.  Ex is now blaming her for son's suicide.  Pt has to file Chapter 13.  Pt is terribly fearful of losing job.   Review of Systems For ROS see HPI     Objective:   Physical Exam  Constitutional: She is oriented to person, place, and time. She appears well-developed and well-nourished.  HENT:  Head: Normocephalic and atraumatic.  Neurological: She is alert and oriented to person, place, and time.  Skin: Skin is warm and dry.  Psychiatric:  Rapid, almost pressured speech Tearful, very anxious Tangential thought process  Vitals reviewed.         Assessment & Plan:

## 2018-02-22 NOTE — Patient Instructions (Signed)
Follow up in 3-4 weeks to recheck mood Continue the Wellbutrin daily and the Alprazolam as needed RESTART the Citalopram daily to improve anxiety Call with any questions or concerns Hang in there!!!  We're here for you!!!

## 2018-03-03 DIAGNOSIS — L570 Actinic keratosis: Secondary | ICD-10-CM | POA: Diagnosis not present

## 2018-03-03 DIAGNOSIS — L249 Irritant contact dermatitis, unspecified cause: Secondary | ICD-10-CM | POA: Diagnosis not present

## 2018-03-10 ENCOUNTER — Other Ambulatory Visit: Payer: Self-pay | Admitting: Physician Assistant

## 2018-03-10 MED ORDER — VALACYCLOVIR HCL 500 MG PO TABS: 500.0000 mg | ORAL_TABLET | Freq: Every day | ORAL | 6 refills | Status: DC

## 2018-03-10 MED ORDER — ALPRAZOLAM 0.5 MG PO TABS: 0.5000 mg | ORAL_TABLET | Freq: Two times a day (BID) | ORAL | 3 refills | Status: DC | PRN

## 2018-03-10 NOTE — Telephone Encounter (Signed)
Will defer further refills of patient's medications to PCP  

## 2018-03-10 NOTE — Telephone Encounter (Signed)
Last OV 02/22/18, Next OV 03/22/18  Xanax 0.5 mg last filled 02/05/18, # 60 with 0 refills  Valtrex 500 mg last filled 02/02/18, # 30 with 0 refills

## 2018-03-19 ENCOUNTER — Ambulatory Visit: Payer: Self-pay | Admitting: Family Medicine

## 2018-03-19 DIAGNOSIS — R351 Nocturia: Secondary | ICD-10-CM | POA: Diagnosis not present

## 2018-03-19 DIAGNOSIS — R8271 Bacteriuria: Secondary | ICD-10-CM | POA: Diagnosis not present

## 2018-03-22 ENCOUNTER — Encounter: Payer: Self-pay | Admitting: General Practice

## 2018-03-22 ENCOUNTER — Ambulatory Visit: Payer: Self-pay | Admitting: Family Medicine

## 2018-03-22 ENCOUNTER — Ambulatory Visit: Payer: Federal, State, Local not specified - PPO | Admitting: Family Medicine

## 2018-03-22 ENCOUNTER — Encounter: Payer: Self-pay | Admitting: Family Medicine

## 2018-03-22 ENCOUNTER — Other Ambulatory Visit: Payer: Self-pay

## 2018-03-22 VITALS — BP 125/80 | HR 83 | Temp 98.0°F | Resp 16 | Ht 62.0 in | Wt 143.1 lb

## 2018-03-22 DIAGNOSIS — F419 Anxiety disorder, unspecified: Secondary | ICD-10-CM | POA: Diagnosis not present

## 2018-03-22 DIAGNOSIS — Z23 Encounter for immunization: Secondary | ICD-10-CM

## 2018-03-22 DIAGNOSIS — F32A Depression, unspecified: Secondary | ICD-10-CM

## 2018-03-22 DIAGNOSIS — F329 Major depressive disorder, single episode, unspecified: Secondary | ICD-10-CM

## 2018-03-22 DIAGNOSIS — E038 Other specified hypothyroidism: Secondary | ICD-10-CM | POA: Diagnosis not present

## 2018-03-22 MED ORDER — ESCITALOPRAM OXALATE 10 MG PO TABS: 10.0000 mg | ORAL_TABLET | Freq: Every day | ORAL | 3 refills | Status: DC

## 2018-03-22 NOTE — Progress Notes (Signed)
   Subjective:    Patient ID: [name] [name], female    DOB: 12/12/1960, 57 y.o.   MRN: [id number]  HPI Depression- ongoing issue.  Was restarted on Celexa 20mg  daily.  Also on Wellbutrin 300mg  daily.  'I haven't really had any improvement'.  Pt is asking to switch to Lexapro b/c she thinks she was on this previously and it helped w/ anxiety.  Pt prefers to start w/ 10mg  rather than 20mg . Pt had FMLA forms completed at last visit but work has not given her an update on the status of this.  Hypothyroid- chronic problem.  Pt is currently on Synthroid 151mcg daily.  Pt reports feeling palpitations but she knows this could be due to her increased anxiety.   Review of Systems For ROS see HPI     Objective:   Physical Exam  Constitutional: She is oriented to person, place, and time. She appears well-developed and well-nourished. No distress.  HENT:  Head: Normocephalic and atraumatic.  Eyes: Pupils are equal, round, and reactive to light. Conjunctivae and EOM are normal.  Neck: Normal range of motion. Neck supple. No thyromegaly present.  Cardiovascular: Normal rate, regular rhythm, normal heart sounds and intact distal pulses.  No murmur heard. Pulmonary/Chest: Effort normal and breath sounds normal. No respiratory distress.  Abdominal: Soft. She exhibits no distension. There is no tenderness.  Musculoskeletal: She exhibits no edema.  Lymphadenopathy:    She has no cervical adenopathy.  Neurological: She is alert and oriented to person, place, and time.  Skin: Skin is warm and dry.  Psychiatric: Her behavior is normal.  Flat affect  Vitals reviewed.         Assessment & Plan:

## 2018-03-22 NOTE — Patient Instructions (Signed)
Follow up in 1 month to recheck mood (sooner if needed) We'll notify you of your lab results and make any changes if needed STOP the Citalopram START the Lexapro once daily Call with any questions or concerns HANG IN THERE!!!  You're doing great!

## 2018-03-22 NOTE — Assessment & Plan Note (Signed)
Chronic problem.  Pt feels that her medication dose may be incorrect based on her recent sxs but is aware that it may be her anxiety that is bothering her.  Check labs.  Adjust meds prn

## 2018-03-22 NOTE — Assessment & Plan Note (Signed)
No improvement since starting Celexa.  Based on this, pt is asking for a switch to Lexapro.  Prescription sent.  Continue Wellbutrin daily.  Pt is trying to find a counselor- applauded that decision.  Will follow.

## 2018-03-23 ENCOUNTER — Encounter: Payer: Self-pay | Admitting: General Practice

## 2018-03-23 LAB — TSH: TSH: 0.36 u[IU]/mL (ref 0.35–4.50)

## 2018-04-14 DIAGNOSIS — L249 Irritant contact dermatitis, unspecified cause: Secondary | ICD-10-CM | POA: Diagnosis not present

## 2018-04-14 DIAGNOSIS — L570 Actinic keratosis: Secondary | ICD-10-CM | POA: Diagnosis not present

## 2018-04-14 DIAGNOSIS — L2089 Other atopic dermatitis: Secondary | ICD-10-CM | POA: Diagnosis not present

## 2018-04-22 ENCOUNTER — Ambulatory Visit: Payer: Federal, State, Local not specified - PPO | Admitting: Family Medicine

## 2018-04-22 ENCOUNTER — Encounter: Payer: Self-pay | Admitting: Family Medicine

## 2018-04-22 ENCOUNTER — Other Ambulatory Visit: Payer: Self-pay

## 2018-04-22 VITALS — BP 120/79 | HR 76 | Temp 97.9°F | Resp 16 | Ht 62.0 in | Wt 141.4 lb

## 2018-04-22 DIAGNOSIS — F329 Major depressive disorder, single episode, unspecified: Secondary | ICD-10-CM

## 2018-04-22 DIAGNOSIS — F419 Anxiety disorder, unspecified: Secondary | ICD-10-CM | POA: Diagnosis not present

## 2018-04-22 DIAGNOSIS — F32A Depression, unspecified: Secondary | ICD-10-CM

## 2018-04-22 NOTE — Patient Instructions (Signed)
Follow up in January as scheduled or sooner if needed CONTINUE the Lexapro 10mg  daily and the Wellbutrin 300mg  daily KEEP UP the good work on exercise, stress outlet, and emotion management Call with any questions or concerns Hang in there!!!

## 2018-04-22 NOTE — Progress Notes (Signed)
   Subjective:    Patient ID: [name] [name], female    DOB: [birth year], 57 y.o.   MRN: [id number]  HPI Depression- at last visit was switched from Celexa to  Lexapro 10mg .  Pt feels this is an improvement.  Continues to take Wellbutrin.  Pt is working on taking control of her emotions.  Pt started walking daily.  She is again cleaning- which she loves.  Motivation level is improving.  Pt is not interested in increasing medication at this time.  Pt has counseling upcoming next week.  FMLA approved.   Review of Systems For ROS see HPI     Objective:   Physical Exam  Constitutional: She is oriented to person, place, and time. She appears well-developed and well-nourished. No distress.  Neurological: She is oriented to person, place, and time.  Psychiatric: She has a normal mood and affect. Her behavior is normal. Thought content normal.  Vitals reviewed.         Assessment & Plan:

## 2018-04-22 NOTE — Assessment & Plan Note (Signed)
Improving since switching to Lexapro.  Starts counseling next week.  She and I both admit she has a long way to go on this journey but she has made considerable progress.  No med changes at this time.  Will continue to follow.

## 2018-04-28 DIAGNOSIS — R351 Nocturia: Secondary | ICD-10-CM | POA: Diagnosis not present

## 2018-04-28 DIAGNOSIS — R350 Frequency of micturition: Secondary | ICD-10-CM | POA: Diagnosis not present

## 2018-05-11 DIAGNOSIS — F4312 Post-traumatic stress disorder, chronic: Secondary | ICD-10-CM | POA: Diagnosis not present

## 2018-05-11 DIAGNOSIS — F331 Major depressive disorder, recurrent, moderate: Secondary | ICD-10-CM | POA: Diagnosis not present

## 2018-05-11 DIAGNOSIS — F410 Panic disorder [episodic paroxysmal anxiety] without agoraphobia: Secondary | ICD-10-CM | POA: Diagnosis not present

## 2018-05-20 DIAGNOSIS — F410 Panic disorder [episodic paroxysmal anxiety] without agoraphobia: Secondary | ICD-10-CM | POA: Diagnosis not present

## 2018-05-20 DIAGNOSIS — F331 Major depressive disorder, recurrent, moderate: Secondary | ICD-10-CM | POA: Diagnosis not present

## 2018-05-20 DIAGNOSIS — F4312 Post-traumatic stress disorder, chronic: Secondary | ICD-10-CM | POA: Diagnosis not present

## 2018-06-01 DIAGNOSIS — F4312 Post-traumatic stress disorder, chronic: Secondary | ICD-10-CM | POA: Diagnosis not present

## 2018-06-01 DIAGNOSIS — F331 Major depressive disorder, recurrent, moderate: Secondary | ICD-10-CM | POA: Diagnosis not present

## 2018-06-01 DIAGNOSIS — F410 Panic disorder [episodic paroxysmal anxiety] without agoraphobia: Secondary | ICD-10-CM | POA: Diagnosis not present

## 2018-06-02 DIAGNOSIS — L218 Other seborrheic dermatitis: Secondary | ICD-10-CM | POA: Diagnosis not present

## 2018-06-02 DIAGNOSIS — L570 Actinic keratosis: Secondary | ICD-10-CM | POA: Diagnosis not present

## 2018-06-02 DIAGNOSIS — L650 Telogen effluvium: Secondary | ICD-10-CM | POA: Diagnosis not present

## 2018-06-07 DIAGNOSIS — R42 Dizziness and giddiness: Secondary | ICD-10-CM | POA: Diagnosis not present

## 2018-06-07 DIAGNOSIS — R51 Headache: Secondary | ICD-10-CM | POA: Diagnosis not present

## 2018-06-07 DIAGNOSIS — H539 Unspecified visual disturbance: Secondary | ICD-10-CM | POA: Diagnosis not present

## 2018-06-08 DIAGNOSIS — E890 Postprocedural hypothyroidism: Secondary | ICD-10-CM | POA: Diagnosis not present

## 2018-06-11 ENCOUNTER — Other Ambulatory Visit: Payer: Self-pay

## 2018-06-11 ENCOUNTER — Encounter: Payer: Self-pay | Admitting: Family Medicine

## 2018-06-11 ENCOUNTER — Ambulatory Visit: Payer: Federal, State, Local not specified - PPO | Admitting: Family Medicine

## 2018-06-11 VITALS — BP 112/81 | HR 94 | Temp 98.1°F | Resp 17 | Ht 62.0 in | Wt 144.1 lb

## 2018-06-11 DIAGNOSIS — F419 Anxiety disorder, unspecified: Secondary | ICD-10-CM

## 2018-06-11 DIAGNOSIS — R351 Nocturia: Secondary | ICD-10-CM | POA: Diagnosis not present

## 2018-06-11 DIAGNOSIS — F329 Major depressive disorder, single episode, unspecified: Secondary | ICD-10-CM | POA: Diagnosis not present

## 2018-06-11 DIAGNOSIS — R350 Frequency of micturition: Secondary | ICD-10-CM | POA: Diagnosis not present

## 2018-06-11 DIAGNOSIS — F32A Depression, unspecified: Secondary | ICD-10-CM

## 2018-06-11 MED ORDER — CITALOPRAM HYDROBROMIDE 10 MG PO TABS: 10.0000 mg | ORAL_TABLET | Freq: Every day | ORAL | 3 refills | Status: DC

## 2018-06-11 NOTE — Assessment & Plan Note (Signed)
Deteriorated.  Pt went to ER for headache and chest tightness- work up was normal.  Discussed at length that this is most likely anxiety related- pt is in agreement.  Agree that she needs some SSRI/SNRI.  She has been on multiple in the past that have not been effective.  Pt is not interested in starting Prozac- 'maybe it's just the name'.  Wants to try Celexa again.  Will follow closely.

## 2018-06-11 NOTE — Patient Instructions (Signed)
Follow up in 3-4 weeks to recheck mood Start the Citalopram once daily in addition to the Wellbutrin Call with any questions or concerns Hang in there!!  You're doing great! Happy Holidays!!

## 2018-06-11 NOTE — Progress Notes (Signed)
   Subjective:    Patient ID: [name] [name], female    DOB: [birth year], 59 y.o.   MRN: [id number]  HPI ER f/u- pt went to ER on 12/2 for HA that she had since 11/28 .  Was given migraine cocktail and had CT/CTA of head and neck (WNL).  At the time, also had palpitations, chest tightness- 'like i'm holding my breath'.  HA is constant.  'i'm under so much stress'.  Pt stopped the Lexapro in late September.  Remains on Wellbutrin. Has a therapist.  No CP.  Not sleeping well.   Review of Systems For ROS see HPI     Objective:   Physical Exam  Constitutional: She is oriented to person, place, and time. She appears well-developed and well-nourished. No distress.  HENT:  Head: Normocephalic and atraumatic.  Cardiovascular: Normal rate, regular rhythm and normal heart sounds.  Pulmonary/Chest: Effort normal and breath sounds normal. No stridor. No respiratory distress. She has no wheezes. She has no rales.  Neurological: She is alert and oriented to person, place, and time. She displays normal reflexes. No cranial nerve deficit. Coordination normal.  Skin: Skin is warm.  Psychiatric:  Anxious, rapid speech  Vitals reviewed.         Assessment & Plan:

## 2018-06-21 DIAGNOSIS — F331 Major depressive disorder, recurrent, moderate: Secondary | ICD-10-CM | POA: Diagnosis not present

## 2018-06-21 DIAGNOSIS — F410 Panic disorder [episodic paroxysmal anxiety] without agoraphobia: Secondary | ICD-10-CM | POA: Diagnosis not present

## 2018-06-21 DIAGNOSIS — F4312 Post-traumatic stress disorder, chronic: Secondary | ICD-10-CM | POA: Diagnosis not present

## 2018-07-02 ENCOUNTER — Encounter: Payer: Self-pay | Admitting: Family Medicine

## 2018-07-02 ENCOUNTER — Ambulatory Visit: Payer: Federal, State, Local not specified - PPO | Admitting: Family Medicine

## 2018-07-02 VITALS — BP 104/66 | HR 81 | Temp 98.1°F | Resp 16 | Ht 62.0 in | Wt 152.0 lb

## 2018-07-02 DIAGNOSIS — F419 Anxiety disorder, unspecified: Secondary | ICD-10-CM | POA: Diagnosis not present

## 2018-07-02 DIAGNOSIS — F329 Major depressive disorder, single episode, unspecified: Secondary | ICD-10-CM | POA: Diagnosis not present

## 2018-07-02 DIAGNOSIS — F32A Depression, unspecified: Secondary | ICD-10-CM

## 2018-07-02 MED ORDER — ALPRAZOLAM 0.5 MG PO TABS: 0.5000 mg | ORAL_TABLET | Freq: Two times a day (BID) | ORAL | 3 refills | Status: DC | PRN

## 2018-07-02 MED ORDER — CITALOPRAM HYDROBROMIDE 20 MG PO TABS: 20.0000 mg | ORAL_TABLET | Freq: Every day | ORAL | 3 refills | Status: DC

## 2018-07-02 NOTE — Progress Notes (Signed)
   Subjective:    Patient ID: [name] [name], female    DOB: 27-Nov-1960, 57 y.o.   MRN: [id number]  HPI Anxiety/Depression- ongoing issue for pt.  At last visit we restarted Citalopram in addition to Wellbutrin.  Pt feels that Celexa is helping.  Pt is not interested in increasing her dose at this time but knows she has 'a lot of work to do'.  She is seeing a therapist and is working on meditation and relaxation- which is helping.  Pt feels she is emotionally eating but 'I know what I have to do'.  Pt knows that her mood has been impacting her attitude at work- took a few days of [employer].  She is aware that this will be a 'slow process'.   Review of Systems For ROS see HPI     Objective:   Physical Exam Vitals signs reviewed.  Constitutional:      General: She is not in acute distress.    Appearance: Normal appearance.  HENT:     Head: Normocephalic and atraumatic.  Skin:    General: Skin is warm and dry.  Neurological:     General: No focal deficit present.     Mental Status: She is alert and oriented to person, place, and time.  Psychiatric:        Mood and Affect: Mood normal.        Behavior: Behavior normal.        Thought Content: Thought content normal.           Assessment & Plan:

## 2018-07-02 NOTE — Patient Instructions (Addendum)
Follow up as needed or as scheduled Increase the Citalopram to 20mg  daily- 2 of what you have at home and 1 of the new prescription Keep up the good work in counseling!!! Call with any questions or concerns Happy New Year!!!

## 2018-07-02 NOTE — Assessment & Plan Note (Signed)
Ongoing issue.  Some improvement since restarting Celexa.  Increase to 20mg  daily and continue to monitor closely.  Applauded her efforts at counseling and meditation.  Will follow.

## 2018-07-03 ENCOUNTER — Other Ambulatory Visit: Payer: Self-pay | Admitting: Family Medicine

## 2018-07-12 DIAGNOSIS — F331 Major depressive disorder, recurrent, moderate: Secondary | ICD-10-CM | POA: Diagnosis not present

## 2018-07-12 DIAGNOSIS — F410 Panic disorder [episodic paroxysmal anxiety] without agoraphobia: Secondary | ICD-10-CM | POA: Diagnosis not present

## 2018-07-12 DIAGNOSIS — F4312 Post-traumatic stress disorder, chronic: Secondary | ICD-10-CM | POA: Diagnosis not present

## 2018-07-27 ENCOUNTER — Encounter: Payer: Federal, State, Local not specified - PPO | Admitting: Family Medicine

## 2018-08-03 DIAGNOSIS — F410 Panic disorder [episodic paroxysmal anxiety] without agoraphobia: Secondary | ICD-10-CM | POA: Diagnosis not present

## 2018-08-03 DIAGNOSIS — F4312 Post-traumatic stress disorder, chronic: Secondary | ICD-10-CM | POA: Diagnosis not present

## 2018-08-03 DIAGNOSIS — F331 Major depressive disorder, recurrent, moderate: Secondary | ICD-10-CM | POA: Diagnosis not present

## 2018-08-09 ENCOUNTER — Other Ambulatory Visit: Payer: Federal, State, Local not specified - PPO

## 2018-08-16 ENCOUNTER — Other Ambulatory Visit: Payer: Self-pay | Admitting: Family Medicine

## 2018-08-26 DIAGNOSIS — F4312 Post-traumatic stress disorder, chronic: Secondary | ICD-10-CM | POA: Diagnosis not present

## 2018-08-26 DIAGNOSIS — F331 Major depressive disorder, recurrent, moderate: Secondary | ICD-10-CM | POA: Diagnosis not present

## 2018-08-26 DIAGNOSIS — F410 Panic disorder [episodic paroxysmal anxiety] without agoraphobia: Secondary | ICD-10-CM | POA: Diagnosis not present

## 2018-09-10 DIAGNOSIS — L570 Actinic keratosis: Secondary | ICD-10-CM | POA: Diagnosis not present

## 2018-09-10 DIAGNOSIS — L718 Other rosacea: Secondary | ICD-10-CM | POA: Diagnosis not present

## 2018-09-14 DIAGNOSIS — F4312 Post-traumatic stress disorder, chronic: Secondary | ICD-10-CM | POA: Diagnosis not present

## 2018-09-14 DIAGNOSIS — F331 Major depressive disorder, recurrent, moderate: Secondary | ICD-10-CM | POA: Diagnosis not present

## 2018-09-14 DIAGNOSIS — F410 Panic disorder [episodic paroxysmal anxiety] without agoraphobia: Secondary | ICD-10-CM | POA: Diagnosis not present

## 2018-09-21 ENCOUNTER — Ambulatory Visit: Payer: BLUE CROSS/BLUE SHIELD | Admitting: Physician Assistant

## 2018-09-21 ENCOUNTER — Encounter: Payer: Self-pay | Admitting: Physician Assistant

## 2018-09-21 ENCOUNTER — Other Ambulatory Visit: Payer: Self-pay

## 2018-09-21 VITALS — BP 110/70 | HR 104 | Temp 97.7°F | Resp 16 | Ht 62.0 in | Wt 149.0 lb

## 2018-09-21 DIAGNOSIS — J069 Acute upper respiratory infection, unspecified: Secondary | ICD-10-CM

## 2018-09-21 DIAGNOSIS — B9789 Other viral agents as the cause of diseases classified elsewhere: Secondary | ICD-10-CM | POA: Diagnosis not present

## 2018-09-21 MED ORDER — HYDROCOD POLST-CPM POLST ER 10-8 MG/5ML PO SUER: 5.0000 mL | Freq: Two times a day (BID) | ORAL | 0 refills | Status: DC | PRN

## 2018-09-21 NOTE — Patient Instructions (Signed)
Please keep well-hydrated and get plenty of rest. Start plain Mucinex twice daily. Continue saline nasal rinses. Start the prescription cough medication as directed.  Stay home until you are feeling better. Call or return for any worsening symptoms.

## 2018-09-21 NOTE — Progress Notes (Signed)
Patient presents to clinic today c/o 1 day of dry but persistent cough associated with some mild PND. Denies ear pressure, ear pain, sinus pain or headache. Denies chest congestion, chest pain, wheezing or chest tightness. Denies recent travel or sick contact. Is taking Mucinex and an old script for Tessalon that is not helping..   Past Medical History:  Diagnosis Date  . Allergy   . Anxiety    takes Xanax prn  . Asthma    dx yrs ago but no problems in > 15 yrs  . Back pain   . Back pain    pinched nerve  . Breast cancer ([place]) 2013   left breast  . Chronic constipation   . Common migraine with intractable migraine 02/12/2016  . Depression    takes Wellbutrin  . Diverticulitis   . Genetic testing 11/03/2016   Ms. Gouin underwent genetic counseling and testing for hereditary cancer syndromes on 10/23/2016. Her results were negative for mutations in all 46 genes analyzed by [facility]'s 46-gene Common Hereditary Cancers Panel. Genes analyzed include: APC, ATM, AXIN2, BARD1, BMPR1A, BRCA1, BRCA2, BRIP1, CDH1, CDKN2A, CHEK2, CTNNA1, DICER1, EPCAM, GREM1, HOXB13, KIT, MEN1, MLH1, MSH2, MSH3, MSH6, MUTYH, NB  . History of blood transfusion    no abnormal reaction noted  . History of shingles   . Hypothyroidism    Graves Disease;takes Synthroid daily  . Inflammatory bowel disease (ulcerative colitis) ([place])   . Multiple allergies    takes Zyrtec nightly    Current Outpatient Medications on File Prior to Visit  Medication Sig Dispense Refill  . ALPRAZolam (XANAX) 0.5 MG tablet Take 1 tablet (0.5 mg total) by mouth 2 (two) times daily as needed for anxiety. 60 tablet 3  . Ascorbic Acid (VITAMIN C PO) Take 2,000 mg by mouth daily.    . Biotin 10 MG TABS Take by mouth daily.    Marland Kitchen buPROPion (WELLBUTRIN XL) 300 MG 24 hr tablet TAKE 1 TABLET(300 MG) BY MOUTH DAILY 30 tablet 6  . Calcium Carbonate-Vitamin D (CALTRATE 600+D) 600-400 MG-UNIT per tablet Take 1 tablet by mouth 2 (two) times daily.     . cetirizine (ZYRTEC) 10 MG tablet Take 10 mg by mouth daily.    . Cholecalciferol (VITAMIN D PO) Take 800 mcg by mouth daily.    . citalopram (CELEXA) 20 MG tablet Take 1 tablet (20 mg total) by mouth daily. 30 tablet 3  . EVENING PRIMROSE OIL PO Take by mouth.    . Iron Combinations (IRON COMPLEX PO) Take 65 mg by mouth every other day.    . meloxicam (MOBIC) 15 MG tablet TAKE 1 TABLET BY MOUTH EVERY DAY 30 tablet 1  . Multiple Vitamins-Minerals (MULTIVITAMIN ADULTS 50+ PO) Take 1 tablet by mouth daily.    . polyethylene glycol (MIRALAX / GLYCOLAX) packet Take by mouth.    . promethazine (PHENERGAN) 25 MG tablet Take 1 tablet (25 mg total) by mouth every 8 (eight) hours as needed for nausea or vomiting. 20 tablet 0  . SYNTHROID 112 MCG tablet   5  . trimethoprim (TRIMPEX) 100 MG tablet TK 1 T PO QD  11  . valACYclovir (VALTREX) 500 MG tablet Take 1 tablet (500 mg total) by mouth daily. 30 tablet 6   No current facility-administered medications on file prior to visit.     Allergies  Allergen Reactions  . Dilaudid [Hydromorphone Hcl] Nausea Only and Rash  . Hydromorphone Hcl Nausea Only and Rash  . Aspirin Nausea And  Vomiting      stomach upset    Family History  Problem Relation Age of Onset  . Heart disease Mother   . Diabetes Mother   . Hyperlipidemia Mother   . Stroke Mother   . Cirrhosis Mother   . Hepatitis C Mother   . Liver cancer Mother        d.64  . Lung cancer Father 69       d.70  . Colon cancer Father 60  . Heart disease Sister   . Diabetes Sister   . Diabetes Brother   . Heart disease Brother   . Heart disease Sister   . Diabetes Sister   . Thyroid cancer Maternal Aunt 55  . Breast cancer Maternal Grandmother 40       d.57 dx'ed at 40.  . Pancreatic cancer Maternal Grandmother        dx'ed after breast cancer.  . Lung cancer Paternal Uncle 60       d.62  . Lung cancer Maternal Grandfather        d.68s  . Cancer Paternal Grandmother        d.40s -  unspecified cancer type  . Lung cancer Paternal Uncle        d.60s  . Pancreatic cancer Paternal Uncle     Social History   Socioeconomic History  . Marital status: Legally Separated    Spouse name: Not on file  . Number of children: 3  . Years of education: College  . Highest education level: Not on file  Occupational History  . Occupation: [employer]    Comment: [employer]  Social Needs  . Financial resource strain: Not on file  . Food insecurity:    Worry: Not on file    Inability: Not on file  . Transportation needs:    Medical: Not on file    Non-medical: Not on file  Tobacco Use  . Smoking status: Former Smoker    Last attempt to quit: 07/13/1991    Years since quitting: 27.2  . Smokeless tobacco: Never Used  . Tobacco comment: quit smoking in 1992  Substance and Sexual Activity  . Alcohol use: No    Alcohol/week: 0.0 standard drinks    Comment: occasionally  . Drug use: No  . Sexual activity: Yes    Birth control/protection: Post-menopausal  Lifestyle  . Physical activity:    Days per week: Not on file    Minutes per session: Not on file  . Stress: Not on file  Relationships  . Social connections:    Talks on phone: Not on file    Gets together: Not on file    Attends religious service: Not on file    Active member of club or organization: Not on file    Attends meetings of clubs or organizations: Not on file    Relationship status: Not on file  Other Topics Concern  . Not on file  Social History Narrative   Lives with husband in a 2 story home.  Has 2 children.  Works as a hospice nurse.     Right-handed   Caffeine: 2 cups per day   Review of Systems - See HPI.  All other ROS are negative.  BP 110/70   Pulse (!) 104   Temp 97.7 F (36.5 C) (Oral)   Resp 16   Ht 5' 2" (1.575 m)   Wt 149 lb (67.6 kg)   SpO2 98%   BMI 27.25 kg/m     Physical Exam Vitals signs reviewed.  Constitutional:      Appearance: Normal appearance.  HENT:     Head:  Normocephalic and atraumatic.     Right Ear: Tympanic membrane normal.     Left Ear: Tympanic membrane normal.     Nose: Nose normal.     Mouth/Throat:     Mouth: Mucous membranes are moist.  Eyes:     Conjunctiva/sclera: Conjunctivae normal.  Neck:     Musculoskeletal: Neck supple.  Cardiovascular:     Rate and Rhythm: Normal rate and regular rhythm.     Heart sounds: Normal heart sounds.  Pulmonary:     Effort: Pulmonary effort is normal.     Breath sounds: Normal breath sounds. No wheezing or rales.  Neurological:     General: No focal deficit present.     Mental Status: She is alert and oriented to person, place, and time.  Psychiatric:        Mood and Affect: Mood normal.    Assessment/Plan: 1. Viral URI with cough Afebrile. Good vitals. Lungs CTAB. Supportive measures and OTC medications reviewed. Rx Tussionex. She has been advised to stay home until feeling better. Call or return immediately for any new or worsening symptoms. - chlorpheniramine-HYDROcodone (TUSSIONEX PENNKINETIC ER) 10-8 MG/5ML SUER; Take 5 mLs by mouth every 12 (twelve) hours as needed.  Dispense: 70 mL; Refill: 0   Leeanne Rio, PA-C

## 2018-09-22 ENCOUNTER — Encounter: Payer: Self-pay | Admitting: Family Medicine

## 2018-09-23 ENCOUNTER — Other Ambulatory Visit: Payer: Self-pay | Admitting: *Deleted

## 2018-09-23 ENCOUNTER — Ambulatory Visit: Payer: Self-pay | Admitting: *Deleted

## 2018-09-23 ENCOUNTER — Telehealth: Payer: Self-pay | Admitting: *Deleted

## 2018-09-23 ENCOUNTER — Telehealth: Payer: Self-pay | Admitting: Emergency Medicine

## 2018-09-23 DIAGNOSIS — R509 Fever, unspecified: Secondary | ICD-10-CM

## 2018-09-23 DIAGNOSIS — R6889 Other general symptoms and signs: Secondary | ICD-10-CM

## 2018-09-23 NOTE — Progress Notes (Signed)
nov 

## 2018-09-23 NOTE — Telephone Encounter (Signed)
Pt seen by C. Hassell Done 09/21/2018, LGT,cough, 'URI.' Reports this AM 0800 temp 101.5, took tylenol, temp. now 101.8. Cough severe, non-productive. States moderate SOB with exertion, mild at rest. COVID-19 exposure at workplace per pt report, see MYChart messages 09/22/2018. [place] called practice spoke to [place]; states order placed for testing. Pt directed to [address], instructions provided. Reviewed precautions. Instructed to go to ED if symptoms worsen.Pt verbalizes understanding.  Reason for Disposition . [1] Fever or feeling feverish AND [2] within 14 Days of COVID-19 EXPOSURE    Order for testing  Answer Assessment - Initial Assessment Questions 1. PLACE of CONTACT: "Where were you when you were exposed to COVID-19  (coronavirus disease 2019)?" (e.g., city, state, country)    Possible; workplace with patient 2. TYPE of CONTACT: "How much contact was there?" (e.g., live in same house, work in same office, same school)     Patient care 3. DATE of CONTACT: "When did you have contact with a coronavirus patient?" (e.g., days)     1.5 weeks ago 4. DURATION of CONTACT: "How long were you in contact with the COVID-19 (coronavirus disease) patient?" (e.g., a few seconds, passed by person, a few minutes, live with the patient)     unsure 5. SYMPTOMS: "Do you have any symptoms?" (e.g., fever, cough, breathing difficulty)     Fever 101.8 after tylenol 6. PREGNANCY OR POSTPARTUM: "Is there any chance you are pregnant?" "When was your last menstrual period?" "Did you deliver in the last 2 weeks?"     no 7. HIGH RISK: "Do you have any heart or lung problems? Do you have a weakened immune system?" (e.g., CHF, COPD, asthma, HIV positive,  chemotherapy, renal failure, diabetes mellitus, sickle cell anemia) no  Protocols used: CORONAVIRUS (COVID-19) EXPOSURE-A-AH

## 2018-09-23 NOTE — Telephone Encounter (Signed)
Took call from [facility]. Pt called to get order for [facility] testing. She was in office on 09/20/17 for URI symptoms no fever. Her symptoms have got worse. Fever of 101.8 after Tylenol, worsening cough, SOB at rest. She works at the [facility] hospital where she was exposed to a patient that passed away due to Coronavirus.

## 2018-09-28 ENCOUNTER — Encounter: Payer: Self-pay | Admitting: Physician Assistant

## 2018-09-28 LAB — NOVEL CORONAVIRUS, NAA: SARS-CoV-2, NAA: NOT DETECTED

## 2018-09-29 ENCOUNTER — Encounter: Payer: Self-pay | Admitting: Physician Assistant

## 2018-10-13 ENCOUNTER — Other Ambulatory Visit: Payer: Self-pay

## 2018-10-13 ENCOUNTER — Ambulatory Visit (INDEPENDENT_AMBULATORY_CARE_PROVIDER_SITE_OTHER): Payer: Federal, State, Local not specified - PPO | Admitting: Family Medicine

## 2018-10-13 ENCOUNTER — Encounter: Payer: Self-pay | Admitting: Family Medicine

## 2018-10-13 VITALS — BP 120/82 | HR 86 | Temp 98.1°F | Wt 148.0 lb

## 2018-10-13 DIAGNOSIS — R140 Abdominal distension (gaseous): Secondary | ICD-10-CM | POA: Diagnosis not present

## 2018-10-13 DIAGNOSIS — R110 Nausea: Secondary | ICD-10-CM | POA: Diagnosis not present

## 2018-10-13 DIAGNOSIS — R6889 Other general symptoms and signs: Secondary | ICD-10-CM

## 2018-10-13 DIAGNOSIS — R05 Cough: Secondary | ICD-10-CM | POA: Diagnosis not present

## 2018-10-13 DIAGNOSIS — Z20822 Contact with and (suspected) exposure to covid-19: Secondary | ICD-10-CM

## 2018-10-13 DIAGNOSIS — Z87891 Personal history of nicotine dependence: Secondary | ICD-10-CM | POA: Diagnosis not present

## 2018-10-13 DIAGNOSIS — R109 Unspecified abdominal pain: Secondary | ICD-10-CM | POA: Diagnosis not present

## 2018-10-13 DIAGNOSIS — J111 Influenza due to unidentified influenza virus with other respiratory manifestations: Secondary | ICD-10-CM | POA: Diagnosis not present

## 2018-10-13 NOTE — Progress Notes (Signed)
I have discussed the procedure for the virtual visit with the patient who has given consent to proceed with assessment and treatment.  ° °Kathryn A Nelson, CMA ° ° ° ° °

## 2018-10-13 NOTE — Progress Notes (Signed)
Virtual Visit via Video   I connected with [name] [name] on 10/13/18 at  1:00 PM EDT by a video enabled telemedicine application and verified that I am speaking with the correct person using two identifiers. Location patient: Home Location provider: [facility], Office Persons participating in the virtual visit: pt and myself  I discussed the limitations of evaluation and management by telemedicine and the availability of in person appointments. The patient expressed understanding and agreed to proceed.  Subjective:   HPI:  SOB- pt continues to cough since visit in late March.  + weakness.  + SOB w/ exertion.  + COVID exposures.  Pt is more comfortable sitting or lying.  No fevers.  + dry cough.  Pt is SOB w/ showering and walking to mailbox.  Walking across the room to get phone made pt SOB.  If pt gets up without pausing to sit, she gets lightheaded.  Pt works as a Marine scientist at [employer] and has had multiple COVID exposures since testing negative in [place] pain- pt reports increased bloating since 'the end of March'.  Pt is on a clear liquid diet.  Has tried multiple bowel agents to 'clean my system out'.  'even water is bloating me'.  Discomfort is more R sided.  Pt reports abd is swollen from breasts to pelvis.  Having bowel movements, occasional diarrhea.  + nausea.  No vomiting.  No pain w/ urination.  No GERD sxs.  'the whole abdomen is sensitive.    ROS: See pertinent positives and negatives per HPI.  Patient Active Problem List   Diagnosis Date Noted   Anxiety and depression 05/04/2017   Genetic testing 11/03/2016   Vertigo 02/12/2016   Screening for malignant neoplasm of cervix 03/22/2014   Chronic cough 03/22/2014   Hot flashes, menopausal 12/07/2013   History of breast cancer 12/07/2013   Constipation 11/18/2013   Ductal carcinoma in situ of breast 06/02/2012   Osteopenia 04/27/2012   Low back pain radiating to right leg 03/17/2012    Hyperlipidemia 02/27/2012   Numbness and tingling of right leg 02/24/2012   Preventative health care 02/24/2012   FATIGUE 08/22/2010   BACK PAIN 06/06/2010   Neuropathy ([place]) 05/28/2010   GASTROENTERITIS 04/05/2010   PALPITATIONS 02/07/2010   MIGRAINE, CHRONIC 08/13/2009   B12 deficiency 07/12/2009   HSV 12/01/2006   Hypothyroidism 12/01/2006   ALLERGIC RHINITIS 12/01/2006   ASTHMA 12/01/2006    Social History   Tobacco Use   Smoking status: Former Smoker    Last attempt to quit: 07/13/1991    Years since quitting: 27.2   Smokeless tobacco: Never Used   Tobacco comment: quit smoking in 1992  Substance Use Topics   Alcohol use: No    Alcohol/week: 0.0 standard drinks    Comment: occasionally    Current Outpatient Medications:    ALPRAZolam (XANAX) 0.5 MG tablet, Take 1 tablet (0.5 mg total) by mouth 2 (two) times daily as needed for anxiety., Disp: 60 tablet, Rfl: 3   Ascorbic Acid (VITAMIN C PO), Take 2,000 mg by mouth daily., Disp: , Rfl:    buPROPion (WELLBUTRIN XL) 300 MG 24 hr tablet, TAKE 1 TABLET(300 MG) BY MOUTH DAILY, Disp: 30 tablet, Rfl: 6   Calcium Carbonate-Vitamin D (CALTRATE 600+D) 600-400 MG-UNIT per tablet, Take 1 tablet by mouth 2 (two) times daily., Disp: , Rfl:    cetirizine (ZYRTEC) 10 MG tablet, Take 10 mg by mouth daily., Disp: , Rfl:    Cholecalciferol (VITAMIN  D PO), Take 800 mcg by mouth daily., Disp: , Rfl:    citalopram (CELEXA) 20 MG tablet, Take 1 tablet (20 mg total) by mouth daily., Disp: 30 tablet, Rfl: 3   Multiple Vitamins-Minerals (MULTIVITAMIN ADULTS 50+ PO), Take 1 tablet by mouth daily., Disp: , Rfl:    polyethylene glycol (MIRALAX / GLYCOLAX) packet, Take by mouth., Disp: , Rfl:    valACYclovir (VALTREX) 500 MG tablet, Take 1 tablet (500 mg total) by mouth daily., Disp: 30 tablet, Rfl: 6   Biotin 10 MG TABS, Take by mouth daily., Disp: , Rfl:    chlorpheniramine-HYDROcodone (TUSSIONEX PENNKINETIC ER) 10-8  MG/5ML SUER, Take 5 mLs by mouth every 12 (twelve) hours as needed. (Patient not taking: Reported on 10/13/2018), Disp: 70 mL, Rfl: 0   EVENING PRIMROSE OIL PO, Take by mouth., Disp: , Rfl:    Iron Combinations (IRON COMPLEX PO), Take 65 mg by mouth every other day., Disp: , Rfl:    meloxicam (MOBIC) 15 MG tablet, TAKE 1 TABLET BY MOUTH EVERY DAY (Patient not taking: Reported on 10/13/2018), Disp: 30 tablet, Rfl: 1   promethazine (PHENERGAN) 25 MG tablet, Take 1 tablet (25 mg total) by mouth every 8 (eight) hours as needed for nausea or vomiting. (Patient not taking: Reported on 10/13/2018), Disp: 20 tablet, Rfl: 0   SYNTHROID 112 MCG tablet, , Disp: , Rfl: 5   trimethoprim (TRIMPEX) 100 MG tablet, TK 1 T PO QD, Disp: , Rfl: 11  Allergies  Allergen Reactions   Dilaudid [Hydromorphone Hcl] Nausea Only and Rash   Hydromorphone Hcl Nausea Only and Rash   Aspirin Nausea And Vomiting      stomach upset    Objective:   BP 120/82    Pulse 86    Temp 98.1 F (36.7 C) (Oral)    Wt 148 lb (67.1 kg)    BMI 27.07 kg/m  AAOx3, appears very fatigued NCAT, EOMI No obvious CN deficits Pale Pt is able to speak clearly but increased work of breathing while talking and obvious shortness of breath.  Near continuous dry cough that also results in SOB  Thought process is linear.  Mood is appropriate.   Assessment and Plan:   Suspected COVID infxn- pt's dry cough, SOB, fatigue, and now GI sxs are all concerning for COVID infxn despite being afebrile.  She is SOB w/ minimal exertion- at times during the visit talking was difficult.  She has had multiple COVID exposures.  Given the inability to get imaging- CXR or abd films- and the inability to check CBC or CMP, pt will go to ER for evaluation.  Daughter is available to drive her and pt has masks at home.  Stressed the need to be seen given her excessive SOB- pt is aware and agreeable to this plan.  Annye Asa, MD 10/13/2018

## 2018-10-14 ENCOUNTER — Encounter: Payer: Self-pay | Admitting: Family Medicine

## 2018-10-14 MED ORDER — OMEPRAZOLE 40 MG PO CPDR: 40.0000 mg | DELAYED_RELEASE_CAPSULE | Freq: Every day | ORAL | 3 refills | Status: DC

## 2018-10-25 DIAGNOSIS — E890 Postprocedural hypothyroidism: Secondary | ICD-10-CM | POA: Diagnosis not present

## 2018-10-26 ENCOUNTER — Other Ambulatory Visit: Payer: Self-pay | Admitting: Family Medicine

## 2018-10-27 ENCOUNTER — Encounter: Payer: Federal, State, Local not specified - PPO | Admitting: Family Medicine

## 2018-10-28 ENCOUNTER — Other Ambulatory Visit: Payer: Self-pay

## 2018-10-28 ENCOUNTER — Encounter: Payer: Self-pay | Admitting: Family Medicine

## 2018-10-28 ENCOUNTER — Ambulatory Visit (INDEPENDENT_AMBULATORY_CARE_PROVIDER_SITE_OTHER): Payer: Federal, State, Local not specified - PPO | Admitting: Family Medicine

## 2018-10-28 VITALS — BP 124/82 | HR 80 | Temp 100.7°F | Ht 62.0 in | Wt 145.0 lb

## 2018-10-28 DIAGNOSIS — R6889 Other general symptoms and signs: Secondary | ICD-10-CM | POA: Diagnosis not present

## 2018-10-28 DIAGNOSIS — Z20822 Contact with and (suspected) exposure to covid-19: Secondary | ICD-10-CM

## 2018-10-28 DIAGNOSIS — R509 Fever, unspecified: Secondary | ICD-10-CM | POA: Diagnosis not present

## 2018-10-28 DIAGNOSIS — R05 Cough: Secondary | ICD-10-CM

## 2018-10-28 DIAGNOSIS — R059 Cough, unspecified: Secondary | ICD-10-CM

## 2018-10-28 MED ORDER — DOXYCYCLINE HYCLATE 100 MG PO TABS: 100.0000 mg | ORAL_TABLET | Freq: Two times a day (BID) | ORAL | 0 refills | Status: DC

## 2018-10-28 MED ORDER — HYDROCOD POLST-CPM POLST ER 10-8 MG/5ML PO SUER: 5.0000 mL | Freq: Two times a day (BID) | ORAL | 0 refills | Status: DC | PRN

## 2018-10-28 NOTE — Progress Notes (Signed)
Virtual Visit via Video   I connected with patient on 10/28/18 at 11:00 AM EDT by a video enabled telemedicine application and verified that I am speaking with the correct person using two identifiers.  Location patient: Home Location provider: [facility], Office Persons participating in the virtual visit: Patient, Provider, [place] ([place])  I discussed the limitations of evaluation and management by telemedicine and the availability of in person appointments. The patient expressed understanding and agreed to proceed.  Subjective:   HPI:   Cough/Fever- pt reports she developed a cough on 4/13.  It has worsened.  Has been nasal wash w/o relief.  Started Mucinex and Robitussin w/o relief.  Initially cough was only sx.  + COVID exposure.  Pt was COVID tested at work on 4/20- was told she was negative yesterday but was not allowed to return to work until cleared by PCP.  Today developed a temp to 100.7.  Denies shortness of breath.  + HA yesterday, sore throat last night.    ROS:   See pertinent positives and negatives per HPI.  Patient Active Problem List   Diagnosis Date Noted  . Anxiety and depression 05/04/2017  . Genetic testing 11/03/2016  . Vertigo 02/12/2016  . Screening for malignant neoplasm of cervix 03/22/2014  . Chronic cough 03/22/2014  . Hot flashes, menopausal 12/07/2013  . History of breast cancer 12/07/2013  . Constipation 11/18/2013  . Ductal carcinoma in situ of breast 06/02/2012  . Osteopenia 04/27/2012  . Low back pain radiating to right leg 03/17/2012  . Hyperlipidemia 02/27/2012  . Numbness and tingling of right leg 02/24/2012  . Preventative health care 02/24/2012  . FATIGUE 08/22/2010  . BACK PAIN 06/06/2010  . Neuropathy ([place]) 05/28/2010  . GASTROENTERITIS 04/05/2010  . PALPITATIONS 02/07/2010  . MIGRAINE, CHRONIC 08/13/2009  . B12 deficiency 07/12/2009  . HSV 12/01/2006  . Hypothyroidism 12/01/2006  . ALLERGIC RHINITIS  12/01/2006  . ASTHMA 12/01/2006    Social History   Tobacco Use  . Smoking status: Former Smoker    Last attempt to quit: 07/13/1991    Years since quitting: 27.3  . Smokeless tobacco: Never Used  . Tobacco comment: quit smoking in 1992  Substance Use Topics  . Alcohol use: No    Alcohol/week: 0.0 standard drinks    Comment: occasionally    Current Outpatient Medications:  .  ALPRAZolam (XANAX) 0.5 MG tablet, Take 1 tablet (0.5 mg total) by mouth 2 (two) times daily as needed for anxiety., Disp: 60 tablet, Rfl: 3 .  Ascorbic Acid (VITAMIN C PO), Take 2,000 mg by mouth daily., Disp: , Rfl:  .  Biotin 10 MG TABS, Take by mouth daily., Disp: , Rfl:  .  buPROPion (WELLBUTRIN XL) 300 MG 24 hr tablet, TAKE 1 TABLET(300 MG) BY MOUTH DAILY, Disp: 30 tablet, Rfl: 6 .  Calcium Carbonate-Vitamin D (CALTRATE 600+D) 600-400 MG-UNIT per tablet, Take 1 tablet by mouth 2 (two) times daily., Disp: , Rfl:  .  cetirizine (ZYRTEC) 10 MG tablet, Take 10 mg by mouth daily., Disp: , Rfl:  .  chlorpheniramine-HYDROcodone (TUSSIONEX PENNKINETIC ER) 10-8 MG/5ML SUER, Take 5 mLs by mouth every 12 (twelve) hours as needed., Disp: 70 mL, Rfl: 0 .  Cholecalciferol (VITAMIN D PO), Take 800 mcg by mouth daily., Disp: , Rfl:  .  citalopram (CELEXA) 20 MG tablet, TAKE 1 TABLET(20 MG) BY MOUTH DAILY, Disp: 30 tablet, Rfl: 3 .  EVENING PRIMROSE OIL PO, Take by mouth., Disp: , Rfl:  .  Iron Combinations (IRON COMPLEX PO), Take 65 mg by mouth every other day., Disp: , Rfl:  .  meloxicam (MOBIC) 15 MG tablet, TAKE 1 TABLET BY MOUTH EVERY DAY, Disp: 30 tablet, Rfl: 1 .  Multiple Vitamins-Minerals (MULTIVITAMIN ADULTS 50+ PO), Take 1 tablet by mouth daily., Disp: , Rfl:  .  omeprazole (PRILOSEC) 40 MG capsule, Take 1 capsule (40 mg total) by mouth daily., Disp: 30 capsule, Rfl: 3 .  polyethylene glycol (MIRALAX / GLYCOLAX) packet, Take by mouth., Disp: , Rfl:  .  promethazine (PHENERGAN) 25 MG tablet, Take 1 tablet (25 mg  total) by mouth every 8 (eight) hours as needed for nausea or vomiting., Disp: 20 tablet, Rfl: 0 .  SYNTHROID 112 MCG tablet, , Disp: , Rfl: 5 .  trimethoprim (TRIMPEX) 100 MG tablet, TK 1 T PO QD, Disp: , Rfl: 11 .  valACYclovir (VALTREX) 500 MG tablet, Take 1 tablet (500 mg total) by mouth daily., Disp: 30 tablet, Rfl: 6  Allergies  Allergen Reactions  . Dilaudid [Hydromorphone Hcl] Nausea Only and Rash  . Hydromorphone Hcl Nausea Only and Rash  . Aspirin Nausea And Vomiting      stomach upset    Objective:   BP 124/82   Pulse 80   Temp (!) 100.7 F (38.2 C)   Ht 5\' 2"  (1.575 m)   Wt 145 lb (65.8 kg)   BMI 26.52 kg/m   AAOx3, NAD NCAT, EOMI No obvious CN deficits Coloring WNL Pt is able to speak clearly, coherently without shortness of breath or increased work of breathing.  Near continuous dry cough Thought process is linear.  Mood is appropriate.   Assessment and Plan:   Cough w/ fever (suspected COVID)- pt had negative test on Monday at work but did not develop a fever until today.  Could be bacterial vs false negative test.  Will send abx to cover bacterial illness but pt was given strict instructions to monitor her sxs and reach out immediately if anything changes.  Reviewed supportive care and red flags that should prompt return.  Pt expressed understanding and is in agreement w/ plan.    Annye Asa, MD 10/28/2018

## 2018-10-28 NOTE — Progress Notes (Signed)
I have discussed the procedure for the virtual visit with the patient who has given consent to proceed with assessment and treatment.   Coryn Mosso, CMA     

## 2018-10-31 ENCOUNTER — Encounter: Payer: Self-pay | Admitting: Family Medicine

## 2018-11-01 ENCOUNTER — Encounter: Payer: Self-pay | Admitting: Family Medicine

## 2018-11-03 DIAGNOSIS — E890 Postprocedural hypothyroidism: Secondary | ICD-10-CM | POA: Diagnosis not present

## 2018-11-04 ENCOUNTER — Encounter: Payer: Self-pay | Admitting: Family Medicine

## 2018-11-05 NOTE — Telephone Encounter (Signed)
Forms have been printed and placed in bin w/charge sheet.

## 2018-11-07 ENCOUNTER — Encounter: Payer: Self-pay | Admitting: Family Medicine

## 2018-11-08 ENCOUNTER — Telehealth: Payer: Self-pay

## 2018-11-08 NOTE — Telephone Encounter (Signed)
Copied from [place] [id number]. Topic: General - Inquiry >> Nov 08, 2018  2:31 PM Berneta Levins wrote: Reason for CRM:  See MyChart message from 11/04/2018.  Pt is calling to check on status of FMLA paperwork. Pt can be reached at [phone]

## 2018-11-09 ENCOUNTER — Encounter: Payer: Self-pay | Admitting: Family Medicine

## 2018-11-09 NOTE — Telephone Encounter (Signed)
Forms are in Dr. Virgil Benedict folder to be signed.

## 2018-11-10 ENCOUNTER — Encounter: Payer: Self-pay | Admitting: Family Medicine

## 2018-11-10 ENCOUNTER — Telehealth: Payer: Self-pay | Admitting: Family Medicine

## 2018-11-10 ENCOUNTER — Encounter: Payer: Self-pay | Admitting: General Practice

## 2018-11-10 DIAGNOSIS — Z0279 Encounter for issue of other medical certificate: Secondary | ICD-10-CM

## 2018-11-10 NOTE — Telephone Encounter (Signed)
Please advise 

## 2018-11-10 NOTE — Telephone Encounter (Signed)
[place] for letter indicating that she missed work due to Angus symptoms and that we strongly suspect her test was a false negative.

## 2018-11-10 NOTE — Telephone Encounter (Signed)
Letter was written and given to front desk to email patient. Patient also made aware she can print from [place].

## 2018-11-10 NOTE — Telephone Encounter (Signed)
Spoke to pt letting her know that I emailed her FMLA forms to her, pt now asked if KT could write a note to employer stating that she has COVID-19 symptoms. Pt states that her employer is not wanting to pay her for being out of work since her test was negative in March and she has not been retested.

## 2018-11-14 ENCOUNTER — Encounter: Payer: Self-pay | Admitting: Family Medicine

## 2018-11-15 ENCOUNTER — Encounter: Payer: Self-pay | Admitting: Family Medicine

## 2018-11-15 NOTE — Telephone Encounter (Signed)
Pt states that employer is not accepting work note.  Pt needs it to be very specific that she is symptoms free and can return to work. Please call pt to discuss.

## 2018-11-15 NOTE — Telephone Encounter (Signed)
Letter written

## 2018-11-15 NOTE — Telephone Encounter (Signed)
This is in regards to phone note I sent this morning.

## 2018-11-15 NOTE — Telephone Encounter (Signed)
Spoke with patient. She was advised that note was written and has already forwarded it to her supervisor.

## 2018-11-22 ENCOUNTER — Other Ambulatory Visit: Payer: Self-pay | Admitting: Family Medicine

## 2018-12-06 ENCOUNTER — Other Ambulatory Visit: Payer: Self-pay | Admitting: Family Medicine

## 2018-12-06 NOTE — Telephone Encounter (Signed)
Last refill:07/02/18 #60, 3 Last OV:10/28/18 acute appt               04/22/18 depression f/u

## 2019-01-06 ENCOUNTER — Encounter: Payer: Self-pay | Admitting: Family Medicine

## 2019-01-06 ENCOUNTER — Ambulatory Visit (INDEPENDENT_AMBULATORY_CARE_PROVIDER_SITE_OTHER): Payer: Federal, State, Local not specified - PPO | Admitting: Family Medicine

## 2019-01-06 ENCOUNTER — Other Ambulatory Visit: Payer: Self-pay

## 2019-01-06 VITALS — HR 101 | Temp 99.9°F

## 2019-01-06 DIAGNOSIS — R05 Cough: Secondary | ICD-10-CM | POA: Diagnosis not present

## 2019-01-06 DIAGNOSIS — R1314 Dysphagia, pharyngoesophageal phase: Secondary | ICD-10-CM | POA: Diagnosis not present

## 2019-01-06 DIAGNOSIS — F419 Anxiety disorder, unspecified: Secondary | ICD-10-CM | POA: Diagnosis not present

## 2019-01-06 DIAGNOSIS — Z79899 Other long term (current) drug therapy: Secondary | ICD-10-CM | POA: Diagnosis not present

## 2019-01-06 DIAGNOSIS — Z1159 Encounter for screening for other viral diseases: Secondary | ICD-10-CM | POA: Diagnosis not present

## 2019-01-06 DIAGNOSIS — K317 Polyp of stomach and duodenum: Secondary | ICD-10-CM | POA: Diagnosis not present

## 2019-01-06 DIAGNOSIS — K219 Gastro-esophageal reflux disease without esophagitis: Secondary | ICD-10-CM | POA: Diagnosis not present

## 2019-01-06 DIAGNOSIS — R0989 Other specified symptoms and signs involving the circulatory and respiratory systems: Secondary | ICD-10-CM | POA: Diagnosis not present

## 2019-01-06 DIAGNOSIS — K2950 Unspecified chronic gastritis without bleeding: Secondary | ICD-10-CM | POA: Diagnosis not present

## 2019-01-06 DIAGNOSIS — R51 Headache: Secondary | ICD-10-CM | POA: Diagnosis not present

## 2019-01-06 DIAGNOSIS — F329 Major depressive disorder, single episode, unspecified: Secondary | ICD-10-CM | POA: Diagnosis not present

## 2019-01-06 DIAGNOSIS — Z9013 Acquired absence of bilateral breasts and nipples: Secondary | ICD-10-CM | POA: Diagnosis not present

## 2019-01-06 DIAGNOSIS — K449 Diaphragmatic hernia without obstruction or gangrene: Secondary | ICD-10-CM | POA: Diagnosis not present

## 2019-01-06 DIAGNOSIS — R058 Other specified cough: Secondary | ICD-10-CM

## 2019-01-06 DIAGNOSIS — Z853 Personal history of malignant neoplasm of breast: Secondary | ICD-10-CM | POA: Diagnosis not present

## 2019-01-06 DIAGNOSIS — R1310 Dysphagia, unspecified: Secondary | ICD-10-CM | POA: Diagnosis not present

## 2019-01-06 DIAGNOSIS — J4530 Mild persistent asthma, uncomplicated: Secondary | ICD-10-CM | POA: Diagnosis not present

## 2019-01-06 DIAGNOSIS — E039 Hypothyroidism, unspecified: Secondary | ICD-10-CM | POA: Diagnosis not present

## 2019-01-06 DIAGNOSIS — Z87891 Personal history of nicotine dependence: Secondary | ICD-10-CM | POA: Diagnosis not present

## 2019-01-06 DIAGNOSIS — R1312 Dysphagia, oropharyngeal phase: Secondary | ICD-10-CM | POA: Diagnosis not present
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
## 2019-01-07 DIAGNOSIS — F419 Anxiety disorder, unspecified: Secondary | ICD-10-CM | POA: Diagnosis not present

## 2019-01-07 DIAGNOSIS — F329 Major depressive disorder, single episode, unspecified: Secondary | ICD-10-CM | POA: Diagnosis not present

## 2019-01-07 DIAGNOSIS — J4530 Mild persistent asthma, uncomplicated: Secondary | ICD-10-CM | POA: Diagnosis not present

## 2019-01-07 DIAGNOSIS — R1310 Dysphagia, unspecified: Secondary | ICD-10-CM | POA: Diagnosis not present

## 2019-01-08 DIAGNOSIS — K449 Diaphragmatic hernia without obstruction or gangrene: Secondary | ICD-10-CM | POA: Diagnosis not present

## 2019-01-08 DIAGNOSIS — R1314 Dysphagia, pharyngoesophageal phase: Secondary | ICD-10-CM | POA: Diagnosis not present

## 2019-01-08 DIAGNOSIS — K317 Polyp of stomach and duodenum: Secondary | ICD-10-CM | POA: Diagnosis not present

## 2019-01-08 DIAGNOSIS — D132 Benign neoplasm of duodenum: Secondary | ICD-10-CM | POA: Diagnosis not present

## 2019-01-08 DIAGNOSIS — K2950 Unspecified chronic gastritis without bleeding: Secondary | ICD-10-CM | POA: Diagnosis not present

## 2019-01-08 DIAGNOSIS — R1310 Dysphagia, unspecified: Secondary | ICD-10-CM | POA: Diagnosis not present

## 2019-01-08 DIAGNOSIS — K2970 Gastritis, unspecified, without bleeding: Secondary | ICD-10-CM | POA: Diagnosis not present

## 2019-01-08 DIAGNOSIS — K222 Esophageal obstruction: Secondary | ICD-10-CM | POA: Diagnosis not present

## 2019-01-13 DIAGNOSIS — R1013 Epigastric pain: Secondary | ICD-10-CM | POA: Diagnosis not present

## 2019-01-14 ENCOUNTER — Other Ambulatory Visit: Payer: Self-pay

## 2019-01-14 ENCOUNTER — Encounter: Payer: Self-pay | Admitting: Family Medicine

## 2019-01-14 ENCOUNTER — Ambulatory Visit (INDEPENDENT_AMBULATORY_CARE_PROVIDER_SITE_OTHER): Payer: Federal, State, Local not specified - PPO | Admitting: Family Medicine

## 2019-01-14 VITALS — BP 98/62 | HR 96 | Ht 62.0 in | Wt 144.0 lb

## 2019-01-14 DIAGNOSIS — K2900 Acute gastritis without bleeding: Secondary | ICD-10-CM | POA: Diagnosis not present

## 2019-01-14 DIAGNOSIS — Z9889 Other specified postprocedural states: Secondary | ICD-10-CM

## 2019-01-14 DIAGNOSIS — D132 Benign neoplasm of duodenum: Secondary | ICD-10-CM | POA: Diagnosis not present

## 2019-01-14 DIAGNOSIS — K2100 Gastro-esophageal reflux disease with esophagitis, without bleeding: Secondary | ICD-10-CM

## 2019-01-14 MED ORDER — SUCRALFATE 1 G PO TABS: 1.0000 g | ORAL_TABLET | Freq: Three times a day (TID) | ORAL | 0 refills | Status: DC

## 2019-01-14 NOTE — Progress Notes (Signed)
I have discussed the procedure for the virtual visit with the patient who has given consent to proceed with assessment and treatment.   Jessica L Brodmerkel, CMA     

## 2019-01-14 NOTE — Progress Notes (Signed)
Virtual Visit via Video   I connected with patient on 01/14/19 at  2:20 PM EDT by a video enabled telemedicine application and verified that I am speaking with the correct person using two identifiers.  Location patient: Home Location provider: Acupuncturist, Office Persons participating in the virtual visit: Patient, Provider, [place] (Jess B)  I discussed the limitations of evaluation and management by telemedicine and the availability of in person appointments. The patient expressed understanding and agreed to proceed.  Subjective:   HPI:   Hospital f/u- pt was admitted 7/2-7/4 due to severe dysphagia.  Was unable to eat or drink w/o coughing or choking.  Had EGD w/ biopsy and dilation.  Increased GERD, nausea.  Saw surgeon yesterday and she was started on Protonix and Simethicone.  Was told she needs a GI referral for flat duodenal polyp and mild gastritis.  Reviewed d/c summary, labs, imaging, procedure reports.  Reviewed past medical, surgical, family and social histories.   ROS:   See pertinent positives and negatives per HPI.  Patient Active Problem List   Diagnosis Date Noted  . Anxiety and depression 05/04/2017  . Genetic testing 11/03/2016  . Vertigo 02/12/2016  . Screening for malignant neoplasm of cervix 03/22/2014  . Chronic cough 03/22/2014  . Hot flashes, menopausal 12/07/2013  . History of breast cancer 12/07/2013  . Constipation 11/18/2013  . Ductal carcinoma in situ of breast 06/02/2012  . Osteopenia 04/27/2012  . Low back pain radiating to right leg 03/17/2012  . Hyperlipidemia 02/27/2012  . Numbness and tingling of right leg 02/24/2012  . Preventative health care 02/24/2012  . FATIGUE 08/22/2010  . BACK PAIN 06/06/2010  . Neuropathy ([place]) 05/28/2010  . GASTROENTERITIS 04/05/2010  . PALPITATIONS 02/07/2010  . MIGRAINE, CHRONIC 08/13/2009  . B12 deficiency 07/12/2009  . HSV 12/01/2006  . Hypothyroidism 12/01/2006  . ALLERGIC RHINITIS  12/01/2006  . ASTHMA 12/01/2006    Social History   Tobacco Use  . Smoking status: Former Smoker    Quit date: 07/13/1991    Years since quitting: 27.5  . Smokeless tobacco: Never Used  . Tobacco comment: quit smoking in 1992  Substance Use Topics  . Alcohol use: No    Alcohol/week: 0.0 standard drinks    Comment: occasionally    Current Outpatient Medications:  .  ALPRAZolam (XANAX) 0.5 MG tablet, TAKE 1 TABLET(0.5 MG) BY MOUTH TWICE DAILY AS NEEDED FOR ANXIETY, Disp: 60 tablet, Rfl: 1 .  Ascorbic Acid (VITAMIN C PO), Take 2,000 mg by mouth daily., Disp: , Rfl:  .  Biotin 10 MG TABS, Take by mouth daily., Disp: , Rfl:  .  buPROPion (WELLBUTRIN XL) 300 MG 24 hr tablet, TAKE 1 TABLET(300 MG) BY MOUTH DAILY, Disp: 30 tablet, Rfl: 6 .  Calcium Carbonate-Vitamin D (CALTRATE 600+D) 600-400 MG-UNIT per tablet, Take 1 tablet by mouth 2 (two) times daily., Disp: , Rfl:  .  cetirizine (ZYRTEC) 10 MG tablet, Take 10 mg by mouth daily., Disp: , Rfl:  .  Cholecalciferol (VITAMIN D PO), Take 800 mcg by mouth daily., Disp: , Rfl:  .  citalopram (CELEXA) 20 MG tablet, TAKE 1 TABLET(20 MG) BY MOUTH DAILY, Disp: 30 tablet, Rfl: 3 .  EVENING PRIMROSE OIL PO, Take by mouth., Disp: , Rfl:  .  Iron Combinations (IRON COMPLEX PO), Take 65 mg by mouth every other day., Disp: , Rfl:  .  meloxicam (MOBIC) 15 MG tablet, TAKE 1 TABLET BY MOUTH EVERY DAY, Disp: 30 tablet, Rfl: 1 .  Multiple Vitamins-Minerals (MULTIVITAMIN ADULTS 50+ PO), Take 1 tablet by mouth daily., Disp: , Rfl:  .  pantoprazole (PROTONIX) 40 MG tablet, , Disp: , Rfl:  .  polyethylene glycol (MIRALAX / GLYCOLAX) packet, Take by mouth., Disp: , Rfl:  .  Simethicone Extra Strength 125 MG CAPS, Take by mouth., Disp: , Rfl:  .  SYNTHROID 112 MCG tablet, , Disp: , Rfl: 5 .  valACYclovir (VALTREX) 500 MG tablet, TAKE 1 TABLET(500 MG) BY MOUTH DAILY, Disp: 30 tablet, Rfl: 6  Allergies  Allergen Reactions  . Dilaudid [Hydromorphone Hcl] Nausea  Only and Rash  . Hydromorphone Hcl Nausea Only and Rash  . Aspirin Nausea And Vomiting      stomach upset    Objective:   BP 98/62   Pulse 96   Ht 5\' 2"  (1.575 m)   Wt 144 lb (65.3 kg)   SpO2 99%   BMI 26.34 kg/m  AAOx3, NAD NCAT, EOMI No obvious CN deficits Coloring WNL Pt is able to speak clearly, coherently without shortness of breath or increased work of breathing.  Coughs w/ each swallow Thought process is linear.  Mood is appropriate.   Assessment and Plan:   Gastritis- new.  Pt was switched to once daily protonix yesterday but given the presence of known gastritis will increase this to BID.  Will also add Carafate to allow healing.  Will set pt up w/ local GI for ongoing evaluation and tx of this condition as her sxs have dramatically worsened recently and led to acute dysphagia.  Reviewed supportive care and red flags that should prompt return.  Pt expressed understanding and is in agreement w/ plan.    Annye Asa, MD 01/14/2019

## 2019-01-18 ENCOUNTER — Telehealth: Payer: Self-pay | Admitting: Gastroenterology

## 2019-01-18 NOTE — Telephone Encounter (Signed)
Dr. Ardis Hughs, this pt has GI hx with you.  [name] [name] recently was admitted at [facility] and had an EGD 01/08/19 (can be viewed in [facility]).  Pt would like to transfer care back.  Will you approve?

## 2019-01-19 NOTE — Telephone Encounter (Signed)
I actually looked through epic and care everywhere and although it looks like I should be able to pull up the EGD report I just cannot find it.  Can you please see if the EGD procedure report can be sent here for my review? thanks

## 2019-01-24 ENCOUNTER — Ambulatory Visit (INDEPENDENT_AMBULATORY_CARE_PROVIDER_SITE_OTHER): Payer: Federal, State, Local not specified - PPO | Admitting: Family Medicine

## 2019-01-24 ENCOUNTER — Encounter: Payer: Self-pay | Admitting: Gastroenterology

## 2019-01-24 ENCOUNTER — Other Ambulatory Visit: Payer: Self-pay

## 2019-01-24 ENCOUNTER — Encounter: Payer: Self-pay | Admitting: Family Medicine

## 2019-01-24 VITALS — BP 108/68 | HR 70 | Ht 62.0 in | Wt 144.3 lb

## 2019-01-24 DIAGNOSIS — E669 Obesity, unspecified: Secondary | ICD-10-CM | POA: Insufficient documentation

## 2019-01-24 DIAGNOSIS — Z0000 Encounter for general adult medical examination without abnormal findings: Secondary | ICD-10-CM

## 2019-01-24 DIAGNOSIS — E785 Hyperlipidemia, unspecified: Secondary | ICD-10-CM | POA: Diagnosis not present

## 2019-01-24 DIAGNOSIS — E538 Deficiency of other specified B group vitamins: Secondary | ICD-10-CM

## 2019-01-24 DIAGNOSIS — E663 Overweight: Secondary | ICD-10-CM | POA: Diagnosis not present

## 2019-01-24 MED ORDER — PANTOPRAZOLE SODIUM 40 MG PO TBEC: 40.0000 mg | DELAYED_RELEASE_TABLET | Freq: Two times a day (BID) | ORAL | 3 refills | Status: DC

## 2019-01-24 NOTE — Progress Notes (Signed)
I have discussed the procedure for the virtual visit with the patient who has given consent to proceed with assessment and treatment.   Pt states that depression is controlled with medication. States that she decreased to 1 alprazolam at bedtime and is working on her sleep.   Davis Gourd, CMA

## 2019-01-24 NOTE — Assessment & Plan Note (Signed)
Check labs and replete prn. 

## 2019-01-24 NOTE — Assessment & Plan Note (Signed)
pts BMI is now 26.4  Stressed need for healthy diet and regular exercise.  Will follow.

## 2019-01-24 NOTE — Progress Notes (Signed)
Virtual Visit via Video   I connected with patient on 01/24/19 at  3:30 PM EDT by a video enabled telemedicine application and verified that I am speaking with the correct person using two identifiers.  Location patient: Home Location provider: Acupuncturist, Office Persons participating in the virtual visit: Patient, Provider, [name]  I discussed the limitations of evaluation and management by telemedicine and the availability of in person appointments. The patient expressed understanding and agreed to proceed.  Subjective:   HPI:   CPE- UTD on colonoscopy, pap, DEXA, immunizations.  Denies concerns today.  ROS:  Patient reports no vision/ hearing changes, adenopathy,fever, weight change,  persistant/recurrent hoarseness , swallowing issues, chest pain, palpitations, edema, persistant/recurrent cough, hemoptysis, dyspnea (rest/exertional/paroxysmal nocturnal), gastrointestinal bleeding (melena, rectal bleeding), bowel changes, GU symptoms (dysuria, hematuria, incontinence), Gyn symptoms (abnormal  bleeding, pain),  syncope, focal weakness, memory loss, numbness & tingling, skin/hair/nail changes, abnormal bruising or bleeding, anxiety, or depression.   + GERD + abdominal bloating  Patient Active Problem List   Diagnosis Date Noted  . Overweight (BMI 25.0-29.9) 01/24/2019  . Anxiety and depression 05/04/2017  . Genetic testing 11/03/2016  . Vertigo 02/12/2016  . Screening for malignant neoplasm of cervix 03/22/2014  . Chronic cough 03/22/2014  . Hot flashes, menopausal 12/07/2013  . History of breast cancer 12/07/2013  . Constipation 11/18/2013  . Ductal carcinoma in situ of breast 06/02/2012  . Osteopenia 04/27/2012  . Low back pain radiating to right leg 03/17/2012  . Hyperlipidemia 02/27/2012  . Numbness and tingling of right leg 02/24/2012  . Preventative health care 02/24/2012  . FATIGUE 08/22/2010  . BACK PAIN 06/06/2010  . Neuropathy ([place]) 05/28/2010   . GASTROENTERITIS 04/05/2010  . PALPITATIONS 02/07/2010  . MIGRAINE, CHRONIC 08/13/2009  . B12 deficiency 07/12/2009  . HSV 12/01/2006  . Hypothyroidism 12/01/2006  . ALLERGIC RHINITIS 12/01/2006  . ASTHMA 12/01/2006    Social History   Tobacco Use  . Smoking status: Former Smoker    Quit date: 07/13/1991    Years since quitting: 27.5  . Smokeless tobacco: Never Used  . Tobacco comment: quit smoking in 1992  Substance Use Topics  . Alcohol use: No    Alcohol/week: 0.0 standard drinks    Comment: occasionally    Current Outpatient Medications:  .  ALPRAZolam (XANAX) 0.5 MG tablet, TAKE 1 TABLET(0.5 MG) BY MOUTH TWICE DAILY AS NEEDED FOR ANXIETY, Disp: 60 tablet, Rfl: 1 .  Ascorbic Acid (VITAMIN C PO), Take 2,000 mg by mouth daily., Disp: , Rfl:  .  Biotin 10 MG TABS, Take by mouth daily., Disp: , Rfl:  .  buPROPion (WELLBUTRIN XL) 300 MG 24 hr tablet, TAKE 1 TABLET(300 MG) BY MOUTH DAILY, Disp: 30 tablet, Rfl: 6 .  Calcium Carbonate-Vitamin D (CALTRATE 600+D) 600-400 MG-UNIT per tablet, Take 1 tablet by mouth 2 (two) times daily., Disp: , Rfl:  .  cetirizine (ZYRTEC) 10 MG tablet, Take 10 mg by mouth daily., Disp: , Rfl:  .  Cholecalciferol (VITAMIN D PO), Take 800 mcg by mouth daily., Disp: , Rfl:  .  citalopram (CELEXA) 20 MG tablet, TAKE 1 TABLET(20 MG) BY MOUTH DAILY, Disp: 30 tablet, Rfl: 3 .  EVENING PRIMROSE OIL PO, Take by mouth., Disp: , Rfl:  .  Iron Combinations (IRON COMPLEX PO), Take 65 mg by mouth every other day., Disp: , Rfl:  .  meloxicam (MOBIC) 15 MG tablet, TAKE 1 TABLET BY MOUTH EVERY DAY, Disp: 30 tablet, Rfl: 1 .  Multiple Vitamins-Minerals (MULTIVITAMIN ADULTS 50+ PO), Take 1 tablet by mouth daily., Disp: , Rfl:  .  pantoprazole (PROTONIX) 40 MG tablet, Take 1 tablet (40 mg total) by mouth 2 (two) times daily., Disp: 60 tablet, Rfl: 3 .  polyethylene glycol (MIRALAX / GLYCOLAX) packet, Take by mouth., Disp: , Rfl:  .  Simethicone Extra Strength 125 MG CAPS,  Take by mouth., Disp: , Rfl:  .  sucralfate (CARAFATE) 1 g tablet, Take 1 tablet (1 g total) by mouth 4 (four) times daily -  with meals and at bedtime., Disp: 120 tablet, Rfl: 0 .  SYNTHROID 112 MCG tablet, , Disp: , Rfl: 5 .  valACYclovir (VALTREX) 500 MG tablet, TAKE 1 TABLET(500 MG) BY MOUTH DAILY, Disp: 30 tablet, Rfl: 6  Allergies  Allergen Reactions  . Dilaudid [Hydromorphone Hcl] Nausea Only and Rash  . Hydromorphone Hcl Nausea Only and Rash  . Aspirin Nausea And Vomiting      stomach upset    Objective:   BP 108/68   Pulse 70   Ht 5\' 2"  (1.575 m)   Wt 144 lb 5 oz (65.5 kg)   SpO2 99%   BMI 26.40 kg/m   AAOx3, NAD NCAT, EOMI No obvious CN deficits Coloring WNL Pt is able to speak clearly, coherently without shortness of breath or increased work of breathing.  Thought process is linear.  Mood is appropriate.   Assessment and Plan:   CPE- UTD on pap, DEXA, colonoscopy, immunizations.  No concerns today.  Cursory video exam WNL.  Check labs on Thursday.  Anticipatory guidance provided.    Annye Asa, MD 01/24/2019

## 2019-01-25 ENCOUNTER — Telehealth: Payer: Self-pay | Admitting: Family Medicine

## 2019-01-25 DIAGNOSIS — Z0279 Encounter for issue of other medical certificate: Secondary | ICD-10-CM

## 2019-01-25 NOTE — Telephone Encounter (Signed)
fyi

## 2019-01-25 NOTE — Telephone Encounter (Signed)
Form completed and placed in basket  

## 2019-01-25 NOTE — Telephone Encounter (Signed)
Paperwork given to PCP for completion.  

## 2019-01-25 NOTE — Telephone Encounter (Signed)
I have placed FMLA forms in the bin up front with a charge sheet. FMLA reason was discussed at appt on 01/24/2019.

## 2019-01-25 NOTE — Telephone Encounter (Signed)
Picked up forms from the back and faxed and sent to scan///[email or web address]

## 2019-01-27 ENCOUNTER — Other Ambulatory Visit: Payer: Self-pay

## 2019-01-27 ENCOUNTER — Other Ambulatory Visit (INDEPENDENT_AMBULATORY_CARE_PROVIDER_SITE_OTHER): Payer: Federal, State, Local not specified - PPO

## 2019-01-27 DIAGNOSIS — E785 Hyperlipidemia, unspecified: Secondary | ICD-10-CM | POA: Diagnosis not present

## 2019-01-27 DIAGNOSIS — E538 Deficiency of other specified B group vitamins: Secondary | ICD-10-CM | POA: Diagnosis not present

## 2019-01-27 LAB — CBC WITH DIFFERENTIAL/PLATELET
Basophils Absolute: 0 10*3/uL (ref 0.0–0.1)
Basophils Relative: 0.3 % (ref 0.0–3.0)
Eosinophils Absolute: 0.1 10*3/uL (ref 0.0–0.7)
Eosinophils Relative: 1.5 % (ref 0.0–5.0)
HCT: 41.4 % (ref 36.0–46.0)
Hemoglobin: 13.5 g/dL (ref 12.0–15.0)
Lymphocytes Relative: 32.9 % (ref 12.0–46.0)
Lymphs Abs: 1.8 10*3/uL (ref 0.7–4.0)
MCHC: 32.7 g/dL (ref 30.0–36.0)
MCV: 90.4 fl (ref 78.0–100.0)
Monocytes Absolute: 0.4 10*3/uL (ref 0.1–1.0)
Monocytes Relative: 7.7 % (ref 3.0–12.0)
Neutro Abs: 3.1 10*3/uL (ref 1.4–7.7)
Neutrophils Relative %: 57.6 % (ref 43.0–77.0)
Platelets: 219 10*3/uL (ref 150.0–400.0)
RBC: 4.58 Mil/uL (ref 3.87–5.11)
RDW: 13.3 % (ref 11.5–15.5)
WBC: 5.4 10*3/uL (ref 4.0–10.5)

## 2019-01-27 LAB — HEPATIC FUNCTION PANEL
ALT: 17 U/L (ref 0–35)
AST: 18 U/L (ref 0–37)
Albumin: 4.6 g/dL (ref 3.5–5.2)
Alkaline Phosphatase: 79 U/L (ref 39–117)
Bilirubin, Direct: 0 mg/dL (ref 0.0–0.3)
Total Bilirubin: 0.4 mg/dL (ref 0.2–1.2)
Total Protein: 7.3 g/dL (ref 6.0–8.3)

## 2019-01-27 LAB — BASIC METABOLIC PANEL
BUN: 15 mg/dL (ref 6–23)
CO2: 31 mEq/L (ref 19–32)
Calcium: 10.2 mg/dL (ref 8.4–10.5)
Chloride: 102 mEq/L (ref 96–112)
Creatinine, Ser: 0.82 mg/dL (ref 0.40–1.20)
GFR: 71.02 mL/min (ref >60.00)
Glucose, Bld: 97 mg/dL (ref 70–99)
Potassium: 4.7 mEq/L (ref 3.5–5.1)
Sodium: 140 mEq/L (ref 135–145)

## 2019-01-27 LAB — LIPID PANEL
Cholesterol: 220 mg/dL — ABNORMAL HIGH (ref 0–200)
HDL: 70 mg/dL (ref >39.00)
LDL Cholesterol: 128 mg/dL — ABNORMAL HIGH (ref 0–99)
NonHDL: 150.3
Total CHOL/HDL Ratio: 3
Triglycerides: 114 mg/dL (ref 0.0–149.0)
VLDL: 22.8 mg/dL (ref 0.0–40.0)

## 2019-01-27 LAB — TSH: TSH: 0.76 u[IU]/mL (ref 0.35–4.50)

## 2019-01-27 LAB — VITAMIN B12: Vitamin B-12: 539 pg/mL (ref 211–911)

## 2019-02-14 ENCOUNTER — Other Ambulatory Visit: Payer: Self-pay | Admitting: Family Medicine

## 2019-02-14 NOTE — Telephone Encounter (Signed)
Last OV 01/24/19 Alprazolam last filled 12/06/18 #60 with 1

## 2019-02-25 ENCOUNTER — Encounter: Payer: Self-pay | Admitting: Gastroenterology

## 2019-02-25 ENCOUNTER — Encounter (HOSPITAL_COMMUNITY)
Admission: RE | Disposition: A | Discharge status: Home / Self Care | Payer: Self-pay | Source: Home / Self Care | Attending: Gastroenterology

## 2019-02-25 ENCOUNTER — Ambulatory Visit: Payer: Federal, State, Local not specified - PPO | Admitting: Gastroenterology

## 2019-02-25 VITALS — BP 100/60 | HR 88 | Temp 98.0°F | Ht 61.75 in | Wt 151.4 lb

## 2019-02-25 DIAGNOSIS — R112 Nausea with vomiting, unspecified: Secondary | ICD-10-CM | POA: Diagnosis not present

## 2019-02-25 DIAGNOSIS — K9289 Other specified diseases of the digestive system: Secondary | ICD-10-CM

## 2019-02-25 SURGERY — COLONOSCOPY WITH PROPOFOL
Anesthesia: Monitor Anesthesia Care
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
## 2019-02-25 NOTE — Patient Instructions (Addendum)
Please taper off your protonix over the next week   Then start Pepcid 20 mg twice daily OTC  Take Gas-X with every meal  You have been scheduled for an endoscopy. Please follow written instructions given to you at your visit today. If you use inhalers (even only as needed), please bring them with you on the day of your procedure. Your physician has requested that you go to www.startemmi.com and enter the access code given to you at your visit today. This web site gives a general overview about your procedure. However, you should still follow specific instructions given to you by our office regarding your preparation for the procedure.  Thank you for entrusting me with your care and choosing [facility].  Dr Ardis Hughs

## 2019-03-04 ENCOUNTER — Telehealth: Payer: Self-pay

## 2019-03-04 NOTE — Telephone Encounter (Signed)
Paperwork was placed in PCP folder for completion.

## 2019-03-04 NOTE — Telephone Encounter (Signed)
Copied from [place] [id number]. Topic: General - Other >> Mar 04, 2019 10:27 AM Celene Kras A wrote: Reason for CRM: Pt called and is requesting to know if PCP received FMLA paperwork through email yesterday. Please advise.

## 2019-03-10 NOTE — Telephone Encounter (Signed)
Patient called in wanting an update on paperwork. She talked with her claim manager today and he states that paperwork needs to be done tomorrow or it will be several weeks before it can be processed. He will not be in the office after tomorrow for several weeks, and no one will be available to do her claim.  She is asking if there is any way that Dr. Birdie Riddle can do the papers so that she does not have to wait weeks for an approval.

## 2019-03-11 NOTE — Telephone Encounter (Signed)
Form completed and placed in basket.  Please fax for pt

## 2019-03-11 NOTE — Telephone Encounter (Signed)
Picked up from the back emailed to  [email or web address] and sent to scan

## 2019-03-14 ENCOUNTER — Other Ambulatory Visit: Payer: Self-pay | Admitting: Family Medicine

## 2019-03-15 ENCOUNTER — Other Ambulatory Visit (HOSPITAL_COMMUNITY)
Admission: RE | Admit: 2019-03-15 | Discharge: 2019-03-15 | Disposition: A | Discharge status: Home / Self Care | Payer: Federal, State, Local not specified - PPO | Source: Ambulatory Visit | Attending: Gastroenterology | Admitting: Gastroenterology

## 2019-03-15 ENCOUNTER — Other Ambulatory Visit: Payer: Self-pay

## 2019-03-15 ENCOUNTER — Encounter (HOSPITAL_COMMUNITY): Payer: Self-pay

## 2019-03-15 ENCOUNTER — Other Ambulatory Visit (HOSPITAL_COMMUNITY): Payer: Self-pay | Admitting: *Deleted

## 2019-03-15 DIAGNOSIS — Z01812 Encounter for preprocedural laboratory examination: Secondary | ICD-10-CM | POA: Diagnosis not present

## 2019-03-15 DIAGNOSIS — Z20828 Contact with and (suspected) exposure to other viral communicable diseases: Secondary | ICD-10-CM | POA: Diagnosis not present

## 2019-03-15 LAB — SARS CORONAVIRUS 2 (TAT 6-24 HRS): SARS Coronavirus 2: NEGATIVE

## 2019-03-15 NOTE — Progress Notes (Signed)
SPOKE W/  [place]     SCREENING SYMPTOMS OF COVID 19:   COUGH--NO  RUNNY NOSE--- NO  SORE THROAT---NO  NASAL CONGESTION----NO  SNEEZING----NO  SHORTNESS OF BREATH---NO  DIFFICULTY BREATHING---NO  TEMP >100.0 -----NO  UNEXPLAINED BODY ACHES------NO  CHILLS -------- NO  HEADACHES ---------NO  LOSS OF SMELL/ TASTE --------NO    HAVE YOU OR ANY FAMILY MEMBER TRAVELLED PAST 14 DAYS OUT OF THE   COUNTY---Lives in [facility] STATE----NO COUNTRY----NO  HAVE YOU OR ANY FAMILY MEMBER BEEN EXPOSED TO ANYONE WITH COVID 19? NO

## 2019-03-15 NOTE — Progress Notes (Signed)
Left message for patient to call back for pre op history and instructions for 03-17-19 procedure

## 2019-03-16 NOTE — Progress Notes (Signed)
Pre-op call made,confirmed patient has been quarantined since covid test, and has not felt sick or been around anyone sick. All questions answered. 

## 2019-03-17 ENCOUNTER — Encounter (HOSPITAL_COMMUNITY): Payer: Self-pay | Admitting: Gastroenterology

## 2019-03-17 ENCOUNTER — Ambulatory Visit (HOSPITAL_COMMUNITY): Payer: Federal, State, Local not specified - PPO | Admitting: Certified Registered"

## 2019-03-17 ENCOUNTER — Other Ambulatory Visit: Payer: Self-pay

## 2019-03-17 ENCOUNTER — Ambulatory Visit (HOSPITAL_COMMUNITY)
Admission: RE | Admit: 2019-03-17 | Discharge: 2019-03-17 | Disposition: A | Discharge status: Home / Self Care | Payer: Federal, State, Local not specified - PPO | Attending: Gastroenterology | Admitting: Gastroenterology

## 2019-03-17 ENCOUNTER — Encounter (HOSPITAL_COMMUNITY)
Admission: RE | Disposition: A | Discharge status: Home / Self Care | Payer: Self-pay | Source: Home / Self Care | Attending: Gastroenterology

## 2019-03-17 DIAGNOSIS — K317 Polyp of stomach and duodenum: Secondary | ICD-10-CM | POA: Insufficient documentation

## 2019-03-17 DIAGNOSIS — K449 Diaphragmatic hernia without obstruction or gangrene: Secondary | ICD-10-CM | POA: Insufficient documentation

## 2019-03-17 DIAGNOSIS — K219 Gastro-esophageal reflux disease without esophagitis: Secondary | ICD-10-CM | POA: Insufficient documentation

## 2019-03-17 DIAGNOSIS — D132 Benign neoplasm of duodenum: Secondary | ICD-10-CM

## 2019-03-17 DIAGNOSIS — Z800 Family history of malignant neoplasm of digestive organs: Secondary | ICD-10-CM | POA: Insufficient documentation

## 2019-03-17 DIAGNOSIS — K2970 Gastritis, unspecified, without bleeding: Secondary | ICD-10-CM | POA: Diagnosis not present

## 2019-03-17 DIAGNOSIS — Z853 Personal history of malignant neoplasm of breast: Secondary | ICD-10-CM | POA: Insufficient documentation

## 2019-03-17 DIAGNOSIS — K2950 Unspecified chronic gastritis without bleeding: Secondary | ICD-10-CM | POA: Diagnosis not present

## 2019-03-17 DIAGNOSIS — Z79890 Hormone replacement therapy (postmenopausal): Secondary | ICD-10-CM | POA: Insufficient documentation

## 2019-03-17 DIAGNOSIS — R1013 Epigastric pain: Secondary | ICD-10-CM | POA: Diagnosis present

## 2019-03-17 DIAGNOSIS — M1990 Unspecified osteoarthritis, unspecified site: Secondary | ICD-10-CM | POA: Insufficient documentation

## 2019-03-17 DIAGNOSIS — Z885 Allergy status to narcotic agent status: Secondary | ICD-10-CM | POA: Insufficient documentation

## 2019-03-17 DIAGNOSIS — Z886 Allergy status to analgesic agent status: Secondary | ICD-10-CM | POA: Diagnosis not present

## 2019-03-17 DIAGNOSIS — E039 Hypothyroidism, unspecified: Secondary | ICD-10-CM | POA: Insufficient documentation

## 2019-03-17 DIAGNOSIS — F329 Major depressive disorder, single episode, unspecified: Secondary | ICD-10-CM | POA: Insufficient documentation

## 2019-03-17 DIAGNOSIS — Z79899 Other long term (current) drug therapy: Secondary | ICD-10-CM | POA: Insufficient documentation

## 2019-03-17 DIAGNOSIS — Z87891 Personal history of nicotine dependence: Secondary | ICD-10-CM | POA: Diagnosis not present

## 2019-03-17 DIAGNOSIS — B9681 Helicobacter pylori [H. pylori] as the cause of diseases classified elsewhere: Secondary | ICD-10-CM | POA: Diagnosis not present

## 2019-03-17 DIAGNOSIS — F419 Anxiety disorder, unspecified: Secondary | ICD-10-CM | POA: Diagnosis not present

## 2019-03-17 DIAGNOSIS — K2990 Gastroduodenitis, unspecified, without bleeding: Secondary | ICD-10-CM

## 2019-03-17 DIAGNOSIS — J45909 Unspecified asthma, uncomplicated: Secondary | ICD-10-CM | POA: Diagnosis not present

## 2019-03-17 HISTORY — DX: Esophageal polyp: K22.81

## 2019-03-17 HISTORY — DX: Dysphagia, unspecified: R13.10

## 2019-03-17 HISTORY — DX: Unspecified osteoarthritis, unspecified site: M19.90

## 2019-03-17 HISTORY — DX: Anemia, unspecified: D64.9

## 2019-03-17 HISTORY — PX: ESOPHAGOGASTRODUODENOSCOPY (EGD) WITH PROPOFOL: SHX5813

## 2019-03-17 HISTORY — PX: BIOPSY: SHX5522

## 2019-03-17 HISTORY — DX: Gastro-esophageal reflux disease without esophagitis: K21.9

## 2019-03-17 HISTORY — DX: Presence of spectacles and contact lenses: Z97.3

## 2019-03-17 SURGERY — ESOPHAGOGASTRODUODENOSCOPY (EGD) WITH PROPOFOL
Anesthesia: Monitor Anesthesia Care

## 2019-03-17 MED ORDER — LACTATED RINGERS IV SOLN
INTRAVENOUS | Status: DC
  Administered 2019-03-17: 09:00:00 via INTRAVENOUS

## 2019-03-17 MED ORDER — PROPOFOL 500 MG/50ML IV EMUL
INTRAVENOUS | Status: DC | PRN
  Administered 2019-03-17: 125 ug/kg/min via INTRAVENOUS

## 2019-03-17 MED ORDER — LIDOCAINE 2% (20 MG/ML) 5 ML SYRINGE
INTRAMUSCULAR | Status: DC | PRN
  Administered 2019-03-17: 80 mg via INTRAVENOUS

## 2019-03-17 MED ORDER — PROPOFOL 10 MG/ML IV BOLUS
INTRAVENOUS | Status: DC | PRN
  Administered 2019-03-17: 30 mg via INTRAVENOUS

## 2019-03-17 SURGICAL SUPPLY — 15 items

## 2019-03-17 NOTE — Interval H&P Note (Signed)
History and Physical Interval Note:  03/17/2019 8:41 AM  [name] [name]  has presented today for surgery, with the diagnosis of nausea bloating gas.  The various methods of treatment have been discussed with the patient and family. After consideration of risks, benefits and other options for treatment, the patient has consented to  Procedure(s): ESOPHAGOGASTRODUODENOSCOPY (EGD) WITH PROPOFOL (N/A) as a surgical intervention.  The patient's history has been reviewed, patient examined, no change in status, stable for surgery.  I have reviewed the patient's chart and labs.  Questions were answered to the patient's satisfaction.     Milus Banister

## 2019-03-17 NOTE — Discharge Instructions (Signed)
YOU HAD AN ENDOSCOPIC PROCEDURE TODAY: Refer to the procedure report and other information in the discharge instructions given to you for any specific questions about what was found during the examination. If this information does not answer your questions, please call [place] office at [phone] to clarify.   YOU SHOULD EXPECT: Some feelings of bloating in the abdomen. Passage of more gas than usual. Walking can help get rid of the air that was put into your GI tract during the procedure and reduce the bloating. If you had a lower endoscopy (such as a colonoscopy or flexible sigmoidoscopy) you may notice spotting of blood in your stool or on the toilet paper. Some abdominal soreness may be present for a day or two, also.  DIET: Your first meal following the procedure should be a light meal and then it is ok to progress to your normal diet. A half-sandwich or bowl of soup is an example of a good first meal. Heavy or fried foods are harder to digest and may make you feel nauseous or bloated. Drink plenty of fluids but you should avoid alcoholic beverages for 24 hours. If you had a esophageal dilation, please see attached instructions for diet.    ACTIVITY: Your care partner should take you home directly after the procedure. You should plan to take it easy, moving slowly for the rest of the day. You can resume normal activity the day after the procedure however YOU SHOULD NOT DRIVE, use power tools, machinery or perform tasks that involve climbing or major physical exertion for 24 hours (because of the sedation medicines used during the test).   SYMPTOMS TO REPORT IMMEDIATELY: A gastroenterologist can be reached at any hour. Please call [phone]  for any of the following symptoms:   Following upper endoscopy (EGD, EUS, ERCP, esophageal dilation) Vomiting of blood or coffee ground material  New, significant abdominal pain  New, significant chest pain or pain under the shoulder blades  Painful or  persistently difficult swallowing  New shortness of breath  Black, tarry-looking or red, bloody stools  FOLLOW UP:  If any biopsies were taken you will be contacted by phone or by letter within the next 1-3 weeks. Call [phone]  if you have not heard about the biopsies in 3 weeks.  Please also call with any specific questions about appointments or follow up tests.  

## 2019-03-17 NOTE — Transfer of Care (Signed)
Immediate Anesthesia Transfer of Care Note  Patient: [name] [name]  Procedure(s) Performed: ESOPHAGOGASTRODUODENOSCOPY (EGD) WITH PROPOFOL (N/A ) BIOPSY  Patient Location: Endoscopy Unit  Anesthesia Type:MAC  Level of Consciousness: awake, alert , oriented and patient cooperative  Airway & Oxygen Therapy: Patient Spontanous Breathing and Patient connected to face mask oxygen  Post-op Assessment: Report given to RN, Post -op Vital signs reviewed and stable and Patient moving all extremities  Post vital signs: Reviewed and stable  Last Vitals:  Vitals Value Taken Time  BP    Temp    Pulse 74 03/17/19 1017  Resp 20 03/17/19 1017  SpO2 99 % 03/17/19 1017  Vitals shown include unvalidated device data.  Last Pain:  Vitals:   03/17/19 0859  TempSrc: Oral  PainSc: 0-No pain         Complications: No apparent anesthesia complications

## 2019-03-17 NOTE — Op Note (Signed)
[facility] Patient Name: [name] [name] Procedure Date: 03/17/2019 MRN: [id number] Attending MD: Milus Banister , MD Date of Birth: 01-18-1961 CSN: [id number] Age: 58 Admit Type: Outpatient Procedure:                Upper GI endoscopy Indications:              Dyspepsia since outside EGD 2-3 months ago, was                            found to have a duodenal polyp (TA on path) at that                            EGD as well, poorly described on endoscopy report. Providers:                Milus Banister, MD, Cleda Daub, RN, Elspeth Cho Tech., Technician, Lazaro Arms, Technician Referring MD:              Medicines:                Monitored Anesthesia Care Complications:            No immediate complications. Estimated blood loss:                            None. Estimated Blood Loss:     Estimated blood loss: none. Procedure:                Pre-Anesthesia Assessment:                           - Prior to the procedure, a History and Physical                            was performed, and patient medications and                            allergies were reviewed. The patient's tolerance of                            previous anesthesia was also reviewed. The risks                            and benefits of the procedure and the sedation                            options and risks were discussed with the patient.                            All questions were answered, and informed consent                            was obtained. Prior Anticoagulants: The patient has  taken no previous anticoagulant or antiplatelet                            agents. ASA Grade Assessment: II - A patient with                            mild systemic disease. After reviewing the risks                            and benefits, the patient was deemed in                            satisfactory condition to undergo the procedure.                      After obtaining informed consent, the endoscope was                            passed under direct vision. Throughout the                            procedure, the patient's blood pressure, pulse, and                            oxygen saturations were monitored continuously. The                            [id number]-[id number]) Olympus gastroscope was                            introduced through the mouth, and advanced to the                            second part of duodenum. The upper GI endoscopy was                            accomplished without difficulty. The patient                            tolerated the procedure well. Scope In: Scope Out: Findings:      Small hiatal hernia.      Mild inflammation characterized by erythema and friability was found in       the entire examined stomach. Biopsies were taken with a cold forceps for       histology.      12cm sessile duodenal polyp adjacent to the major papilla but there is a       clear thin margin of normal mucosa between the polyp and the major       papilla (see images).      The exam was otherwise without abnormality. Impression:               - Non-specific gastritis, biopsied.                           - 2cm sessile duodenal polyp adjacent to the major  papilla but there is a clear thin margin of normal                            mucosa between the polyp and the major papilla (see                            images).                           - Small hiatal hernia. Moderate Sedation:      Not Applicable - Patient had care per Anesthesia. Recommendation:           - Patient has a contact number available for                            emergencies. The signs and symptoms of potential                            delayed complications were discussed with the                            patient. Return to normal activities tomorrow.                            Written discharge  instructions were provided to the                            patient.                           - Low fat diet trial for now.                           - Await pathology results.                           - Please resume protonix (pantoprazole 40mg  pill,                            one pill shortly before breakfast daily). Also take                            pepcid (20mg  pill, one pill at bedtime nightly).                           - Will likely refer her to Dr. Rush Landmark to                            consider duodenal polypectomy, EMR. Procedure Code(s):        --- Professional ---                           [phone], Esophagogastroduodenoscopy, flexible,  transoral; with biopsy, single or multiple Diagnosis Code(s):        --- Professional ---                           K29.70, Gastritis, unspecified, without bleeding                           R10.13, Epigastric pain CPT copyright 2019 American Medical Association. All rights reserved. The codes documented in this report are preliminary and upon coder review may  be revised to meet current compliance requirements. Milus Banister, MD 03/17/2019 10:28:05 AM This report has been signed electronically. Number of Addenda: 0

## 2019-03-18 NOTE — Anesthesia Postprocedure Evaluation (Signed)
Anesthesia Post Note  Patient: [name] [name]  Procedure(s) Performed: ESOPHAGOGASTRODUODENOSCOPY (EGD) WITH PROPOFOL (N/A ) BIOPSY     Patient location during evaluation: Endoscopy Anesthesia Type: General Level of consciousness: awake and alert Pain management: pain level controlled Vital Signs Assessment: post-procedure vital signs reviewed and stable Respiratory status: spontaneous breathing, nonlabored ventilation, respiratory function stable and patient connected to nasal cannula oxygen Cardiovascular status: blood pressure returned to baseline and stable Postop Assessment: no apparent nausea or vomiting Anesthetic complications: no    Last Vitals:  Vitals:   03/17/19 1018 03/17/19 1030  BP:  118/87  Pulse: 80 83  Resp: 20 19  Temp: 37 C   SpO2: 100% 99%    Last Pain:  Vitals:   03/18/19 1515  TempSrc:   PainSc: 0-No pain                 Tessi Eustache L Ruhama Lehew

## 2019-03-18 NOTE — Anesthesia Preprocedure Evaluation (Signed)
Anesthesia Evaluation  Patient identified by MRN, date of birth, ID band Patient awake    Reviewed: Allergy & Precautions, NPO status , Patient's Chart, lab work & pertinent test results  Airway Mallampati: II  TM Distance: >3 FB Neck ROM: Full    Dental no notable dental hx.    Pulmonary asthma , former smoker,    Pulmonary exam normal breath sounds clear to auscultation       Cardiovascular negative cardio ROS Normal cardiovascular exam Rhythm:Regular Rate:Normal     Neuro/Psych  Headaches, PSYCHIATRIC DISORDERS Anxiety Depression    GI/Hepatic Neg liver ROS, hiatal hernia, PUD, GERD  ,  Endo/Other  Hypothyroidism   Renal/GU negative Renal ROS  negative genitourinary   Musculoskeletal  (+) Arthritis ,   Abdominal   Peds  Hematology negative hematology ROS (+)   Anesthesia Other Findings   Reproductive/Obstetrics                             Anesthesia Physical Anesthesia Plan  ASA: III  Anesthesia Plan: MAC   Post-op Pain Management:    Induction: Intravenous  PONV Risk Score and Plan: 2 and Propofol infusion and Treatment may vary due to age or medical condition  Airway Management Planned: Natural Airway  Additional Equipment:   Intra-op Plan:   Post-operative Plan:   Informed Consent: I have reviewed the patients History and Physical, chart, labs and discussed the procedure including the risks, benefits and alternatives for the proposed anesthesia with the patient or authorized representative who has indicated his/her understanding and acceptance.     Dental advisory given  Plan Discussed with: CRNA  Anesthesia Plan Comments:         Anesthesia Quick Evaluation

## 2019-03-21 ENCOUNTER — Other Ambulatory Visit: Payer: Self-pay | Admitting: Gastroenterology

## 2019-03-21 ENCOUNTER — Telehealth: Payer: Self-pay | Admitting: Gastroenterology

## 2019-03-21 ENCOUNTER — Other Ambulatory Visit: Payer: Self-pay

## 2019-03-21 MED ORDER — TETRACYCLINE HCL 500 MG PO CAPS: 500.0000 mg | ORAL_CAPSULE | Freq: Four times a day (QID) | ORAL | 0 refills | Status: AC

## 2019-03-21 MED ORDER — METRONIDAZOLE 500 MG PO TABS: 500.0000 mg | ORAL_TABLET | Freq: Four times a day (QID) | ORAL | 0 refills | Status: AC

## 2019-03-21 MED ORDER — PYLERA 140-125-125 MG PO CAPS: 3.0000 | ORAL_CAPSULE | Freq: Three times a day (TID) | ORAL | 0 refills | Status: DC

## 2019-03-21 MED ORDER — BISMUTH SUBSALICYLATE 262 MG PO TABS: 2.0000 | ORAL_TABLET | Freq: Four times a day (QID) | ORAL | 0 refills | Status: AC

## 2019-03-21 NOTE — Telephone Encounter (Signed)
lmom for patient to call back 

## 2019-03-21 NOTE — Telephone Encounter (Signed)
PA for Pylera was sent to patient plan and denied. Patient has been notified and will pick up the compponent parts to pylera at her pharmacy. She knows to stay on her daily PPI.

## 2019-03-22 NOTE — Telephone Encounter (Signed)
Confused with the medications she has at the pharmacy. Is looking for clarifications.

## 2019-03-22 NOTE — Telephone Encounter (Signed)
Spoke to patient, All questions answered regarding component parts to pylera. Patient voiced understanding.

## 2019-03-28 ENCOUNTER — Telehealth: Payer: Self-pay | Admitting: Gastroenterology

## 2019-03-28 DIAGNOSIS — R197 Diarrhea, unspecified: Secondary | ICD-10-CM

## 2019-03-28 NOTE — Telephone Encounter (Signed)
Pt states that she is having a reaction to her antibiotic treatment. She states that she has been experiencing diarrhea. Pt would like some advise.

## 2019-03-28 NOTE — Telephone Encounter (Signed)
The pt has been advised and will come in tomorrow for the stool test and start imodium

## 2019-03-28 NOTE — Telephone Encounter (Signed)
I doubt c. Diff since the pylera was just started so recently but lets get c. Diff stool testing to be safe (by PCR and toxin assay).  Recommend imodium PRN for her diarrhea.

## 2019-03-28 NOTE — Telephone Encounter (Signed)
The pt started taking H pylori treatment last week (pylera components) and has had diarrhea and bloating since starting.  She wants to know if she can have a letter for work due to the fact she can not work with diarrhea.  She says she will finish the abx but just needs a work note.  Please advise

## 2019-03-28 NOTE — Telephone Encounter (Signed)
Noted  

## 2019-03-28 NOTE — Telephone Encounter (Signed)
Pt called again stating that she will be here in an hour to pick up test for c-diff. She wanted to make you aware of that.

## 2019-03-29 ENCOUNTER — Other Ambulatory Visit: Payer: Federal, State, Local not specified - PPO

## 2019-03-29 DIAGNOSIS — R197 Diarrhea, unspecified: Secondary | ICD-10-CM

## 2019-03-30 LAB — CLOSTRIDIUM DIFFICILE TOXIN B, QUALITATIVE, REAL-TIME PCR: Toxigenic C. Difficile by PCR: NOT DETECTED

## 2019-04-01 ENCOUNTER — Telehealth: Payer: Self-pay | Admitting: Gastroenterology

## 2019-04-01 NOTE — Telephone Encounter (Signed)
See result note.  

## 2019-04-07 HISTORY — PX: SMALL INTESTINE SURGERY: SHX150

## 2019-04-13 ENCOUNTER — Telehealth: Payer: Self-pay | Admitting: Gastroenterology

## 2019-04-13 NOTE — Telephone Encounter (Signed)
Pt called and said that she needs to cancel her F/U appt bc her daughter is having surgrey that day(15th). She wanted to reschedule but Dr. Rush Landmark does not have an available days that would fall before her procedure on the 19th at the hospital. She said if the procedure needs to be moved that is fine just to let her know.

## 2019-04-14 ENCOUNTER — Telehealth: Payer: Self-pay | Admitting: Gastroenterology

## 2019-04-14 NOTE — Telephone Encounter (Signed)
Dr. Ardis Hughs pt calling stating that she finished her tx for H pylori on 9/30. She felt at that time she was back to her baseline where she was prior to the antibiotics. She thought she was getting better but this morning she had runny BM's and pain on the right side of her abdomen started. Reports the pain is constant and she rates it an 8 on a scaled of 1-10. She also reports indigestion and bloating. She is taking Protonix 40mg  in the am and pepcid 20mg  at bedtime. As DOD please advise.

## 2019-04-14 NOTE — Telephone Encounter (Signed)
Sent to me in error. I am not the DOD. Forwarding to Dr. Bryan Lemma as requested.

## 2019-04-15 DIAGNOSIS — Z20828 Contact with and (suspected) exposure to other viral communicable diseases: Secondary | ICD-10-CM | POA: Diagnosis not present

## 2019-04-15 DIAGNOSIS — Z9013 Acquired absence of bilateral breasts and nipples: Secondary | ICD-10-CM | POA: Diagnosis not present

## 2019-04-15 DIAGNOSIS — E876 Hypokalemia: Secondary | ICD-10-CM | POA: Diagnosis not present

## 2019-04-15 DIAGNOSIS — D372 Neoplasm of uncertain behavior of small intestine: Secondary | ICD-10-CM | POA: Diagnosis not present

## 2019-04-15 DIAGNOSIS — Z87891 Personal history of nicotine dependence: Secondary | ICD-10-CM | POA: Diagnosis not present

## 2019-04-15 DIAGNOSIS — G8918 Other acute postprocedural pain: Secondary | ICD-10-CM | POA: Diagnosis not present

## 2019-04-15 DIAGNOSIS — E872 Acidosis: Secondary | ICD-10-CM | POA: Diagnosis not present

## 2019-04-15 DIAGNOSIS — I959 Hypotension, unspecified: Secondary | ICD-10-CM | POA: Diagnosis not present

## 2019-04-15 DIAGNOSIS — K219 Gastro-esophageal reflux disease without esophagitis: Secondary | ICD-10-CM | POA: Diagnosis not present

## 2019-04-15 DIAGNOSIS — Z853 Personal history of malignant neoplasm of breast: Secondary | ICD-10-CM | POA: Diagnosis not present

## 2019-04-15 DIAGNOSIS — A419 Sepsis, unspecified organism: Secondary | ICD-10-CM | POA: Diagnosis not present

## 2019-04-15 DIAGNOSIS — K5792 Diverticulitis of intestine, part unspecified, without perforation or abscess without bleeding: Secondary | ICD-10-CM | POA: Diagnosis not present

## 2019-04-15 DIAGNOSIS — R1084 Generalized abdominal pain: Secondary | ICD-10-CM | POA: Diagnosis not present

## 2019-04-15 DIAGNOSIS — K5712 Diverticulitis of small intestine without perforation or abscess without bleeding: Secondary | ICD-10-CM | POA: Diagnosis not present

## 2019-04-15 DIAGNOSIS — R0902 Hypoxemia: Secondary | ICD-10-CM | POA: Diagnosis not present

## 2019-04-15 DIAGNOSIS — R933 Abnormal findings on diagnostic imaging of other parts of digestive tract: Secondary | ICD-10-CM | POA: Diagnosis not present

## 2019-04-15 DIAGNOSIS — F419 Anxiety disorder, unspecified: Secondary | ICD-10-CM | POA: Diagnosis not present

## 2019-04-15 DIAGNOSIS — K5700 Diverticulitis of small intestine with perforation and abscess without bleeding: Secondary | ICD-10-CM | POA: Diagnosis not present

## 2019-04-15 DIAGNOSIS — G4700 Insomnia, unspecified: Secondary | ICD-10-CM | POA: Diagnosis not present

## 2019-04-15 DIAGNOSIS — K660 Peritoneal adhesions (postprocedural) (postinfection): Secondary | ICD-10-CM | POA: Diagnosis not present

## 2019-04-15 DIAGNOSIS — T40415A Adverse effect of fentanyl or fentanyl analogs, initial encounter: Secondary | ICD-10-CM | POA: Diagnosis not present

## 2019-04-15 DIAGNOSIS — E039 Hypothyroidism, unspecified: Secondary | ICD-10-CM | POA: Diagnosis not present

## 2019-04-15 DIAGNOSIS — R100 Acute abdomen: Secondary | ICD-10-CM | POA: Diagnosis not present

## 2019-04-15 DIAGNOSIS — F329 Major depressive disorder, single episode, unspecified: Secondary | ICD-10-CM | POA: Diagnosis not present

## 2019-04-15 NOTE — Telephone Encounter (Signed)
I tried contacting the patient myself.  No answer.  I reviewed through prior notes and looks like she had loose stools dating back through 03/2019.  C. difficile was negative.  Has since completed H. pylori treatment.  Can try to continue to contact patient.  May benefit from a course of probiotics, and otherwise can follow-up with Dr. Ardis Hughs or 1 of the APPs if he has no availability.

## 2019-04-16 DIAGNOSIS — E039 Hypothyroidism, unspecified: Secondary | ICD-10-CM | POA: Diagnosis not present

## 2019-04-16 DIAGNOSIS — A419 Sepsis, unspecified organism: Secondary | ICD-10-CM | POA: Diagnosis not present

## 2019-04-16 DIAGNOSIS — D72829 Elevated white blood cell count, unspecified: Secondary | ICD-10-CM | POA: Diagnosis not present

## 2019-04-16 DIAGNOSIS — E876 Hypokalemia: Secondary | ICD-10-CM | POA: Diagnosis not present

## 2019-04-16 DIAGNOSIS — K5712 Diverticulitis of small intestine without perforation or abscess without bleeding: Secondary | ICD-10-CM | POA: Diagnosis not present

## 2019-04-16 DIAGNOSIS — E878 Other disorders of electrolyte and fluid balance, not elsewhere classified: Secondary | ICD-10-CM | POA: Diagnosis not present

## 2019-04-17 DIAGNOSIS — A419 Sepsis, unspecified organism: Secondary | ICD-10-CM | POA: Diagnosis not present

## 2019-04-17 DIAGNOSIS — E876 Hypokalemia: Secondary | ICD-10-CM | POA: Diagnosis not present

## 2019-04-17 DIAGNOSIS — K5712 Diverticulitis of small intestine without perforation or abscess without bleeding: Secondary | ICD-10-CM | POA: Diagnosis not present

## 2019-04-17 DIAGNOSIS — D72829 Elevated white blood cell count, unspecified: Secondary | ICD-10-CM | POA: Diagnosis not present

## 2019-04-17 NOTE — Telephone Encounter (Signed)
Please offer her my first available ROV or sooner OV with APP if available.  Thanks

## 2019-04-18 DIAGNOSIS — R918 Other nonspecific abnormal finding of lung field: Secondary | ICD-10-CM | POA: Diagnosis not present

## 2019-04-18 DIAGNOSIS — K5712 Diverticulitis of small intestine without perforation or abscess without bleeding: Secondary | ICD-10-CM | POA: Diagnosis not present

## 2019-04-18 DIAGNOSIS — J90 Pleural effusion, not elsewhere classified: Secondary | ICD-10-CM | POA: Diagnosis not present

## 2019-04-18 DIAGNOSIS — E878 Other disorders of electrolyte and fluid balance, not elsewhere classified: Secondary | ICD-10-CM | POA: Diagnosis not present

## 2019-04-18 DIAGNOSIS — E039 Hypothyroidism, unspecified: Secondary | ICD-10-CM | POA: Diagnosis not present

## 2019-04-18 DIAGNOSIS — A419 Sepsis, unspecified organism: Secondary | ICD-10-CM | POA: Diagnosis not present

## 2019-04-18 NOTE — Telephone Encounter (Signed)
Left message for pt to call back  °

## 2019-04-18 NOTE — Telephone Encounter (Signed)
Left message for pt to return call if she continues to have complaints.  Will need office visit.

## 2019-04-19 DIAGNOSIS — I493 Ventricular premature depolarization: Secondary | ICD-10-CM | POA: Diagnosis not present

## 2019-04-19 DIAGNOSIS — E878 Other disorders of electrolyte and fluid balance, not elsewhere classified: Secondary | ICD-10-CM | POA: Diagnosis not present

## 2019-04-19 DIAGNOSIS — A419 Sepsis, unspecified organism: Secondary | ICD-10-CM | POA: Diagnosis not present

## 2019-04-19 DIAGNOSIS — E872 Acidosis: Secondary | ICD-10-CM | POA: Diagnosis not present

## 2019-04-19 DIAGNOSIS — K5700 Diverticulitis of small intestine with perforation and abscess without bleeding: Secondary | ICD-10-CM | POA: Diagnosis not present

## 2019-04-19 NOTE — Telephone Encounter (Signed)
appt has been cancelled and the pt will call to set up an ROV with Dr Ardis Hughs in about 4-5 weeks.

## 2019-04-19 NOTE — Telephone Encounter (Signed)
Pt reported that she is currently in the ED in ICU.  FYI.

## 2019-04-19 NOTE — Telephone Encounter (Signed)
FYI Dr Ardis Hughs and [place]

## 2019-04-19 NOTE — Telephone Encounter (Signed)
Okay, thanks.  Please cancel her next week EGD EUS with Dr. Rush Landmark.  She is currently at a [facility] facility and underwent urgent abdominal surgery in the past few days.  It is difficult to exactly tell from reviewing care everywhere but this might have been for small bowel diverticulitis, inflammatory phlegmon, there is also mention of some sort of a "calcified mass".  I could never find a surgical path report associated with the op note.  Regardless I do not think it has anything to do with her duodenal periampullary adenoma.  I think she is going to be at least several weeks recovering before her ampullary adenoma should be readdressed.   Patty, please schedule her for an office visit with me in 4-5 weeks from now.  I will see how she has recovered from her small bowel surgery, hopefully I'll have a better idea than exactly what they did for her and what they found.  From that visit if appropriate I will reach out again to Dr. Rush Landmark about helping with the periampullary duodenal diverticulum.  Thank you

## 2019-04-20 DIAGNOSIS — A419 Sepsis, unspecified organism: Secondary | ICD-10-CM | POA: Diagnosis not present

## 2019-04-20 DIAGNOSIS — K5712 Diverticulitis of small intestine without perforation or abscess without bleeding: Secondary | ICD-10-CM | POA: Diagnosis not present

## 2019-04-20 DIAGNOSIS — E872 Acidosis: Secondary | ICD-10-CM | POA: Diagnosis not present

## 2019-04-20 DIAGNOSIS — E039 Hypothyroidism, unspecified: Secondary | ICD-10-CM | POA: Diagnosis not present

## 2019-04-20 NOTE — Telephone Encounter (Signed)
I am sorry to hear about her current medical issues. Dan, when she is better, let me know so we can move forward with getting her scheduled for EMR discussion and procedure. Thanks. GM

## 2019-04-21 ENCOUNTER — Ambulatory Visit: Payer: Federal, State, Local not specified - PPO | Admitting: Gastroenterology

## 2019-04-21 ENCOUNTER — Other Ambulatory Visit (HOSPITAL_COMMUNITY): Payer: Federal, State, Local not specified - PPO

## 2019-04-21 DIAGNOSIS — A419 Sepsis, unspecified organism: Secondary | ICD-10-CM | POA: Diagnosis not present

## 2019-04-21 DIAGNOSIS — K5700 Diverticulitis of small intestine with perforation and abscess without bleeding: Secondary | ICD-10-CM | POA: Diagnosis not present

## 2019-04-21 DIAGNOSIS — E878 Other disorders of electrolyte and fluid balance, not elsewhere classified: Secondary | ICD-10-CM | POA: Diagnosis not present

## 2019-04-21 DIAGNOSIS — E872 Acidosis: Secondary | ICD-10-CM | POA: Diagnosis not present

## 2019-04-22 DIAGNOSIS — E039 Hypothyroidism, unspecified: Secondary | ICD-10-CM | POA: Diagnosis not present

## 2019-04-22 DIAGNOSIS — K5700 Diverticulitis of small intestine with perforation and abscess without bleeding: Secondary | ICD-10-CM | POA: Diagnosis not present

## 2019-04-25 ENCOUNTER — Ambulatory Visit (HOSPITAL_COMMUNITY): Admit: 2019-04-25 | Payer: Federal, State, Local not specified - PPO | Admitting: Gastroenterology

## 2019-04-25 ENCOUNTER — Encounter (HOSPITAL_COMMUNITY): Payer: Self-pay

## 2019-04-25 SURGERY — UPPER ENDOSCOPIC ULTRASOUND (EUS) RADIAL
Anesthesia: Monitor Anesthesia Care

## 2019-04-28 ENCOUNTER — Encounter: Payer: Self-pay | Admitting: Family Medicine

## 2019-04-28 ENCOUNTER — Ambulatory Visit (INDEPENDENT_AMBULATORY_CARE_PROVIDER_SITE_OTHER): Payer: Federal, State, Local not specified - PPO | Admitting: Family Medicine

## 2019-04-28 ENCOUNTER — Other Ambulatory Visit: Payer: Self-pay

## 2019-04-28 VITALS — BP 88/60 | HR 90 | Ht 62.0 in | Wt 141.0 lb

## 2019-04-28 DIAGNOSIS — F419 Anxiety disorder, unspecified: Secondary | ICD-10-CM | POA: Diagnosis not present

## 2019-04-28 DIAGNOSIS — K5700 Diverticulitis of small intestine with perforation and abscess without bleeding: Secondary | ICD-10-CM | POA: Diagnosis not present

## 2019-04-28 DIAGNOSIS — F32A Depression, unspecified: Secondary | ICD-10-CM

## 2019-04-28 DIAGNOSIS — E038 Other specified hypothyroidism: Secondary | ICD-10-CM

## 2019-04-28 DIAGNOSIS — F329 Major depressive disorder, single episode, unspecified: Secondary | ICD-10-CM

## 2019-04-28 MED ORDER — ALPRAZOLAM 0.5 MG PO TABS: ORAL_TABLET | ORAL | 1 refills | Status: DC

## 2019-04-28 NOTE — Progress Notes (Signed)
Virtual Visit via Video   I connected with patient on 04/28/19 at 11:00 AM EDT by a video enabled telemedicine application and verified that I am speaking with the correct person using two identifiers.  Location patient: Home Location provider: Acupuncturist, Office Persons participating in the virtual visit: Patient, Provider, [place] (Jess B)  I discussed the limitations of evaluation and management by telemedicine and the availability of in person appointments. The patient expressed understanding and agreed to proceed.  Subjective:   HPI:   Hospital f/u- Admitted 10/9 to [facility] w/ sepsis due to diverticulitis.  D/c'd 10/16.  During hospitalization had small bowel resection.  She had NG tube successfully removed and was transitioned to oral Augmentin to complete a 10 day course.  She was d/c'd home on Percocet for pain control.  Has not required pain medication since prior to d/c- only using tylenol.  Able to eat soft diet, tolerating w/o difficulty.  Bowels are moving.  Completed abx.  No nausea/vomiting.  Reviewed H&P, d/c summary, imaging, labs.  Med list reconciled.  Mood- pt reports overall she is doing well.  'it was rough in the hospital'.  Did not get Wellbutrin or Celexa while in hospital.  Last dose of medication was 10/7.  Pt 'got through the worst' of the withdrawal sxs while hospitalized.  Did not restart meds b/c she feared the dose was too high to initiate.  Pt feels that she is doing 'really well' and wants to hold off on restart.  Ex-husband moved back in while pt was in the hospital- which is very stressful given their hx.  Hypothyroid- they did not give pt her Levothyroxine while hospitalized and required a rapid response due to a thyroid crisis.  ROS:   See pertinent positives and negatives per HPI.  Patient Active Problem List   Diagnosis Date Noted  . Gastritis and gastroduodenitis   . Adenomatous duodenal polyp   . Overweight (BMI 25.0-29.9)  01/24/2019  . Anxiety and depression 05/04/2017  . Genetic testing 11/03/2016  . Vertigo 02/12/2016  . Screening for malignant neoplasm of cervix 03/22/2014  . Chronic cough 03/22/2014  . Hot flashes, menopausal 12/07/2013  . History of breast cancer 12/07/2013  . Constipation 11/18/2013  . Ductal carcinoma in situ of breast 06/02/2012  . Osteopenia 04/27/2012  . Low back pain radiating to right leg 03/17/2012  . Hyperlipidemia 02/27/2012  . Numbness and tingling of right leg 02/24/2012  . Preventative health care 02/24/2012  . FATIGUE 08/22/2010  . BACK PAIN 06/06/2010  . Neuropathy ([place]) 05/28/2010  . GASTROENTERITIS 04/05/2010  . PALPITATIONS 02/07/2010  . MIGRAINE, CHRONIC 08/13/2009  . B12 deficiency 07/12/2009  . HSV 12/01/2006  . Hypothyroidism 12/01/2006  . ALLERGIC RHINITIS 12/01/2006  . ASTHMA 12/01/2006    Social History   Tobacco Use  . Smoking status: Former Smoker    Quit date: 07/13/1991    Years since quitting: 27.8  . Smokeless tobacco: Never Used  . Tobacco comment: quit smoking in 1992  Substance Use Topics  . Alcohol use: Not Currently    Alcohol/week: 0.0 standard drinks    Comment: occasionally    Current Outpatient Medications:  .  ALPRAZolam (XANAX) 0.5 MG tablet, TAKE 1 TABLET(0.5 MG) BY MOUTH TWICE DAILY AS NEEDED FOR ANXIETY (Patient taking differently: Take 0.5 mg by mouth 2 (two) times daily as needed for anxiety. ), Disp: 60 tablet, Rfl: 1 .  Ascorbic Acid (VITAMIN C PO), Take 1 tablet by mouth  daily at 2 PM. , Disp: , Rfl:  .  BIOTIN PO, Take by mouth., Disp: , Rfl:  .  Calcium Carbonate-Vitamin D (CALTRATE 600+D PO), Take 1 tablet by mouth 2 (two) times daily., Disp: , Rfl:  .  cetirizine (ZYRTEC) 10 MG tablet, Take 10 mg by mouth daily., Disp: , Rfl:  .  famotidine (PEPCID AC MAXIMUM STRENGTH) 20 MG tablet, Take 20 mg by mouth daily at 2 PM., Disp: , Rfl:  .  Multiple Vitamins-Minerals (MULTIVITAMIN ADULTS 50+ PO), Take 1 tablet by mouth  daily at 2 PM. , Disp: , Rfl:  .  polyethylene glycol (MIRALAX / GLYCOLAX) packet, Take 17 g by mouth daily as needed for mild constipation. , Disp: , Rfl:  .  SYNTHROID 112 MCG tablet, Take 112 mcg by mouth daily before breakfast. , Disp: , Rfl: 5 .  valACYclovir (VALTREX) 500 MG tablet, TAKE 1 TABLET(500 MG) BY MOUTH DAILY (Patient taking differently: Take 500 mg by mouth daily as needed (fever blisters). ), Disp: 30 tablet, Rfl: 6 .  buPROPion (WELLBUTRIN XL) 300 MG 24 hr tablet, TAKE 1 TABLET(300 MG) BY MOUTH DAILY (Patient not taking: No sig reported), Disp: 30 tablet, Rfl: 6 .  citalopram (CELEXA) 20 MG tablet, TAKE 1 TABLET(20 MG) BY MOUTH DAILY (Patient not taking: No sig reported), Disp: 30 tablet, Rfl: 3  Allergies  Allergen Reactions  . Dilaudid [Hydromorphone Hcl] Nausea Only and Rash  . Aspirin Nausea And Vomiting      stomach upset    Objective:   BP (!) 88/60   Pulse 90   Ht 5\' 2"  (1.575 m)   Wt 141 lb (64 kg)   SpO2 99%   BMI 25.79 kg/m   AAOx3, NAD NCAT, EOMI No obvious CN deficits Coloring WNL Pt is able to speak clearly, coherently without shortness of breath or increased work of breathing.  Thought process is linear.  Mood is appropriate.   Assessment and Plan:   Diverticulitis w/ small bowel resection- new to provider.  Pt was admitted w/ sepsis due to diverticulitis and required small bowel resection.  She completed her course of abx and reports doing very well.  Has not required any pain medication.  Has f/u tomorrow.  Tolerating soft diet w/o difficulty.  + BMs.  Overall reports healing w/o difficulty.  Anxiety/Depression- ongoing.  Pt was not given her meds while in the hospital and went through withdrawal.  Since she was already off the medication and feeling well, she decided not to restart.  She is aware to contact me immediately if she starts to backslide.  Will hold on meds at this time.    Hypothyroid- pt was not given her medication while  hospitalized and actually had a rapid response due to thyroid crisis.  She has appt upcoming w/ Endo and is back on her regular dose of medication.  Will continue to follow along.   Annye Asa, MD 04/28/2019

## 2019-04-28 NOTE — Telephone Encounter (Signed)
Last refill: 8.10.20 #60, 1 Last OV: 10.22.20 dx. Anxiety and depression

## 2019-05-20 ENCOUNTER — Telehealth: Payer: Self-pay

## 2019-05-20 NOTE — Telephone Encounter (Signed)
Thanks for update. GM 

## 2019-05-20 NOTE — Telephone Encounter (Signed)
-----   Message from Milus Banister, MD sent at 05/20/2019  5:37 AM EST ----- Regarding: RE: Follow-up Thanks.  She had a SBO last month and required surgery but we've not heard from her since.  Loran Auguste, Can you reach out again to her for ROV with me to discuss how she's done since surgery, the duodenal polyp, and consider [facility] referral for EMR.    thanks ----- Message ----- From: Irving Copas., MD Sent: 05/20/2019   5:18 AM EST To: Milus Banister, MD Subject: Follow-up                                      Dan,Just following up on patients who are waiting for procedures.I had seen that the patient has followed up with you as of yet.  I also do not see a clinic visit pending.Just let me know once you have an opportunity to follow-up with her about [place] moving forward and trying to get this polyp off.Thanks.GM

## 2019-05-20 NOTE — Telephone Encounter (Signed)
I called and spoke with the pt and she tells me that she is not at a good place to set up any appointments at this time.  She said she will call when she is ready.

## 2019-06-27 ENCOUNTER — Other Ambulatory Visit: Payer: Self-pay | Admitting: General Practice

## 2019-06-27 ENCOUNTER — Other Ambulatory Visit: Payer: Self-pay

## 2019-06-27 ENCOUNTER — Telehealth: Payer: Self-pay

## 2019-06-27 MED ORDER — PANTOPRAZOLE SODIUM 40 MG PO TBEC: 40.0000 mg | DELAYED_RELEASE_TABLET | Freq: Every day | ORAL | 1 refills | Status: DC

## 2019-06-27 MED ORDER — PANTOPRAZOLE SODIUM 40 MG PO TBEC: 40.0000 mg | DELAYED_RELEASE_TABLET | Freq: Two times a day (BID) | ORAL | 1 refills | Status: DC

## 2019-06-27 NOTE — Telephone Encounter (Signed)
Patient requesting new script for PROTONIX 40 MG Please advise

## 2019-06-27 NOTE — Addendum Note (Signed)
Addended by: Davis Gourd on: 06/27/2019 09:40 AM   Modules accepted: Orders

## 2019-06-27 NOTE — Telephone Encounter (Signed)
Medication filled to pharmacy as requested.   

## 2019-06-27 NOTE — Telephone Encounter (Signed)
Ok for Protonix 40mg  1 tab daily, #90, 1 refill

## 2019-07-19 ENCOUNTER — Other Ambulatory Visit: Payer: Self-pay | Admitting: General Practice

## 2019-07-19 NOTE — Telephone Encounter (Signed)
Last OV 04/28/19 Alprazolam last filled 04/28/19 #60 with 1

## 2019-07-20 MED ORDER — ALPRAZOLAM 0.5 MG PO TABS: ORAL_TABLET | ORAL | 1 refills | Status: DC

## 2019-07-20 NOTE — Telephone Encounter (Signed)
filled

## 2019-09-09 ENCOUNTER — Other Ambulatory Visit: Payer: Self-pay

## 2019-09-09 ENCOUNTER — Encounter: Payer: Self-pay | Admitting: Family Medicine

## 2019-09-09 ENCOUNTER — Ambulatory Visit (INDEPENDENT_AMBULATORY_CARE_PROVIDER_SITE_OTHER): Payer: Federal, State, Local not specified - PPO | Admitting: Family Medicine

## 2019-09-09 DIAGNOSIS — F419 Anxiety disorder, unspecified: Secondary | ICD-10-CM

## 2019-09-09 DIAGNOSIS — F32A Depression, unspecified: Secondary | ICD-10-CM

## 2019-09-09 DIAGNOSIS — M5410 Radiculopathy, site unspecified: Secondary | ICD-10-CM

## 2019-09-09 DIAGNOSIS — F329 Major depressive disorder, single episode, unspecified: Secondary | ICD-10-CM

## 2019-09-09 MED ORDER — BUPROPION HCL ER (XL) 150 MG PO TB24: 150.0000 mg | ORAL_TABLET | Freq: Every day | ORAL | 3 refills | Status: DC

## 2019-09-09 MED ORDER — PREDNISONE 10 MG PO TABS: ORAL_TABLET | ORAL | 0 refills | Status: DC

## 2019-09-09 NOTE — Progress Notes (Signed)
I have discussed the procedure for the virtual visit with the patient who has given consent to proceed with assessment and treatment.   Pt unable to obtain vitals.   Jessica L Brodmerkel, CMA     

## 2019-09-09 NOTE — Progress Notes (Signed)
Virtual Visit via Video   I connected with patient on 09/09/19 at 11:00 AM EST by a video enabled telemedicine application and verified that I am speaking with the correct person using two identifiers.  Location patient: Home Location provider: Acupuncturist, Office Persons participating in the virtual visit: Patient, Provider, [place] (Jess B)  I discussed the limitations of evaluation and management by telemedicine and the availability of in person appointments. The patient expressed understanding and agreed to proceed.  Subjective:   HPI:   Mood- pt stopped her Citalopram and Wellbutrin during her hospitalization this fall.  Estranged husband was in a 'horrific accident' in [place].  Since his accident, pt has been providing care for him.  Since then, pt has noticed mood worsening.  Pt prefers to restart Wellbutrin alone and monitor for improvement.  Leg pain/numbness- sxs started 'several weeks ago'.  Reports pain started in hip- 'an achy sensation'.  Then developed sensation of 'bee stings'.  Reports leg weakness, foot is falling asleep.  sxs are L sided.  States L foot is cold.  No known injury.  Denies back pain.  Starts in L buttock and hip and radiates down 'my whole leg'.  Nothing improves pain, nothing worsens pain.  sxs are now keeping her from sleep.  ROS:   See pertinent positives and negatives per HPI.  Patient Active Problem List   Diagnosis Date Noted  . Gastritis and gastroduodenitis   . Adenomatous duodenal polyp   . Overweight (BMI 25.0-29.9) 01/24/2019  . Anxiety and depression 05/04/2017  . Genetic testing 11/03/2016  . Vertigo 02/12/2016  . Screening for malignant neoplasm of cervix 03/22/2014  . Chronic cough 03/22/2014  . Hot flashes, menopausal 12/07/2013  . History of breast cancer 12/07/2013  . Constipation 11/18/2013  . Ductal carcinoma in situ of breast 06/02/2012  . Osteopenia 04/27/2012  . Low back pain radiating to right leg 03/17/2012   . Hyperlipidemia 02/27/2012  . Numbness and tingling of right leg 02/24/2012  . Preventative health care 02/24/2012  . FATIGUE 08/22/2010  . BACK PAIN 06/06/2010  . Neuropathy ([place]) 05/28/2010  . GASTROENTERITIS 04/05/2010  . PALPITATIONS 02/07/2010  . MIGRAINE, CHRONIC 08/13/2009  . B12 deficiency 07/12/2009  . HSV 12/01/2006  . Hypothyroidism 12/01/2006  . ALLERGIC RHINITIS 12/01/2006  . ASTHMA 12/01/2006    Social History   Tobacco Use  . Smoking status: Former Smoker    Quit date: 07/13/1991    Years since quitting: 28.1  . Smokeless tobacco: Never Used  . Tobacco comment: quit smoking in 1992  Substance Use Topics  . Alcohol use: Not Currently    Alcohol/week: 0.0 standard drinks    Comment: occasionally    Current Outpatient Medications:  .  ALPRAZolam (XANAX) 0.5 MG tablet, TAKE 1 TABLET(0.5 MG) BY MOUTH TWICE DAILY AS NEEDED FOR ANXIETY, Disp: 60 tablet, Rfl: 1 .  Ascorbic Acid (VITAMIN C PO), Take 1 tablet by mouth daily at 2 PM. , Disp: , Rfl:  .  Biotin 10 MG TABS, Take by mouth., Disp: , Rfl:  .  Calcium Carbonate-Vitamin D (CALTRATE 600+D PO), Take 1 tablet by mouth 2 (two) times daily., Disp: , Rfl:  .  cetirizine (ZYRTEC) 10 MG tablet, Take 10 mg by mouth daily., Disp: , Rfl:  .  famotidine (PEPCID AC MAXIMUM STRENGTH) 20 MG tablet, Take 20 mg by mouth daily at 2 PM., Disp: , Rfl:  .  Multiple Vitamins-Minerals (MULTIVITAMIN ADULTS 50+ PO), Take 1 tablet by mouth  daily at 2 PM. , Disp: , Rfl:  .  pantoprazole (PROTONIX) 40 MG tablet, Take 1 tablet (40 mg total) by mouth 2 (two) times daily., Disp: 180 tablet, Rfl: 1 .  polyethylene glycol (MIRALAX / GLYCOLAX) packet, Take 17 g by mouth daily as needed for mild constipation. , Disp: , Rfl:  .  SYNTHROID 112 MCG tablet, Take 112 mcg by mouth daily before breakfast. , Disp: , Rfl: 5 .  valACYclovir (VALTREX) 500 MG tablet, TAKE 1 TABLET(500 MG) BY MOUTH DAILY (Patient taking differently: Take 500 mg by mouth daily  as needed (fever blisters). ), Disp: 30 tablet, Rfl: 6  Allergies  Allergen Reactions  . Dilaudid [Hydromorphone Hcl] Nausea Only and Rash  . Aspirin Nausea And Vomiting      stomach upset    Objective:   There were no vitals taken for this visit. AAOx3, NAD NCAT, EOMI No obvious CN deficits Coloring WNL Pt is able to speak clearly, coherently without shortness of breath or increased work of breathing.  Thought process is linear.  Mood is appropriate.   Assessment and Plan:   Anxiety/depression- deteriorated.  Will restart Wellbutrin and monitor closely for improvement  Radicular leg pain- new.  L sided.  Pain starts in L buttock or hip and radiated down entirety of legs.  Will start prednisone taper and refer to Ortho for complete evaluation and tx.  Pt expressed understanding and is in agreement w/ plan.    Annye Asa, MD 09/09/2019

## 2019-09-14 ENCOUNTER — Other Ambulatory Visit: Payer: Self-pay | Admitting: Family Medicine

## 2019-09-14 DIAGNOSIS — M5410 Radiculopathy, site unspecified: Secondary | ICD-10-CM

## 2019-09-16 DIAGNOSIS — M5416 Radiculopathy, lumbar region: Secondary | ICD-10-CM | POA: Diagnosis not present

## 2019-09-23 ENCOUNTER — Other Ambulatory Visit: Payer: Self-pay | Admitting: Emergency Medicine

## 2019-09-23 MED ORDER — ALPRAZOLAM 0.5 MG PO TABS: ORAL_TABLET | ORAL | 1 refills | Status: DC

## 2019-09-23 NOTE — Telephone Encounter (Signed)
Xanax last rx 07/20/19 #60 1 RF  LOV: 09/09/19 Anxiety/Depression

## 2019-10-10 DIAGNOSIS — M4727 Other spondylosis with radiculopathy, lumbosacral region: Secondary | ICD-10-CM | POA: Diagnosis not present

## 2019-10-10 DIAGNOSIS — M4726 Other spondylosis with radiculopathy, lumbar region: Secondary | ICD-10-CM | POA: Diagnosis not present

## 2019-10-10 DIAGNOSIS — M5117 Intervertebral disc disorders with radiculopathy, lumbosacral region: Secondary | ICD-10-CM | POA: Diagnosis not present

## 2019-10-10 DIAGNOSIS — M4316 Spondylolisthesis, lumbar region: Secondary | ICD-10-CM | POA: Diagnosis not present

## 2019-10-10 DIAGNOSIS — M5116 Intervertebral disc disorders with radiculopathy, lumbar region: Secondary | ICD-10-CM | POA: Diagnosis not present

## 2019-10-11 ENCOUNTER — Ambulatory Visit: Payer: Federal, State, Local not specified - PPO | Admitting: Family Medicine

## 2019-10-12 ENCOUNTER — Telehealth (INDEPENDENT_AMBULATORY_CARE_PROVIDER_SITE_OTHER): Payer: Federal, State, Local not specified - PPO | Admitting: Family Medicine

## 2019-10-12 ENCOUNTER — Encounter: Payer: Self-pay | Admitting: Family Medicine

## 2019-10-12 ENCOUNTER — Other Ambulatory Visit: Payer: Self-pay

## 2019-10-12 VITALS — BP 128/76 | HR 80 | Temp 97.6°F | Ht 62.0 in | Wt 148.0 lb

## 2019-10-12 DIAGNOSIS — M5410 Radiculopathy, site unspecified: Secondary | ICD-10-CM | POA: Diagnosis not present

## 2019-10-12 DIAGNOSIS — F419 Anxiety disorder, unspecified: Secondary | ICD-10-CM | POA: Diagnosis not present

## 2019-10-12 DIAGNOSIS — F32A Depression, unspecified: Secondary | ICD-10-CM

## 2019-10-12 DIAGNOSIS — F329 Major depressive disorder, single episode, unspecified: Secondary | ICD-10-CM | POA: Diagnosis not present

## 2019-10-12 MED ORDER — PREDNISONE 10 MG PO TABS: ORAL_TABLET | ORAL | 0 refills | Status: DC

## 2019-10-12 NOTE — Progress Notes (Signed)
I have discussed the procedure for the virtual visit with the patient who has given consent to proceed with assessment and treatment.   [name] [name] L Tarshia Kot, CMA     

## 2019-10-12 NOTE — Progress Notes (Signed)
Virtual Visit via Video   I connected with patient on 10/12/19 at 11:30 AM EDT by a video enabled telemedicine application and verified that I am speaking with the correct person using two identifiers.  Location patient: Home Location provider: Acupuncturist, Office Persons participating in the virtual visit: Patient, Provider, [place] (Jess B)  I discussed the limitations of evaluation and management by telemedicine and the availability of in person appointments. The patient expressed understanding and agreed to proceed.  Subjective:   HPI:   Depression/anxiety- pt was restarted on Wellbutrin 150mg  daily.  Pt reports depression is better but she continues to struggle w/ back issues.  Back pain- pt had MRI done 4/5 that shows spondylosis at L4-5 w/ anterolisthesis causing moderate central canal stenosis and mild bilateral neural foraminal narrowing.  Also has L5-S1 L paracentral protrusion causing lateral recess stenosis and abutment upon S1 nerve root.  Is taking tylenol, ibuprofen w/o relief.  Neurosurg has mentioned pain management referral.  She has not seen them or spoken w/ them to discuss MRI results.  Wants to hold off on pain medicine but 'I can't go on like this'.  ROS:   See pertinent positives and negatives per HPI.  Patient Active Problem List   Diagnosis Date Noted  . Gastritis and gastroduodenitis   . Adenomatous duodenal polyp   . Overweight (BMI 25.0-29.9) 01/24/2019  . Anxiety and depression 05/04/2017  . Genetic testing 11/03/2016  . Vertigo 02/12/2016  . Screening for malignant neoplasm of cervix 03/22/2014  . Chronic cough 03/22/2014  . Hot flashes, menopausal 12/07/2013  . History of breast cancer 12/07/2013  . Constipation 11/18/2013  . Ductal carcinoma in situ of breast 06/02/2012  . Osteopenia 04/27/2012  . Low back pain radiating to right leg 03/17/2012  . Hyperlipidemia 02/27/2012  . Numbness and tingling of right leg 02/24/2012  .  Preventative health care 02/24/2012  . FATIGUE 08/22/2010  . BACK PAIN 06/06/2010  . Neuropathy ([place]) 05/28/2010  . GASTROENTERITIS 04/05/2010  . PALPITATIONS 02/07/2010  . MIGRAINE, CHRONIC 08/13/2009  . B12 deficiency 07/12/2009  . HSV 12/01/2006  . Hypothyroidism 12/01/2006  . ALLERGIC RHINITIS 12/01/2006  . ASTHMA 12/01/2006    Social History   Tobacco Use  . Smoking status: Former Smoker    Quit date: 07/13/1991    Years since quitting: 28.2  . Smokeless tobacco: Never Used  . Tobacco comment: quit smoking in 1992  Substance Use Topics  . Alcohol use: Not Currently    Alcohol/week: 0.0 standard drinks    Comment: occasionally    Current Outpatient Medications:  .  ALPRAZolam (XANAX) 0.5 MG tablet, TAKE 1 TABLET(0.5 MG) BY MOUTH TWICE DAILY AS NEEDED FOR ANXIETY, Disp: 60 tablet, Rfl: 1 .  Ascorbic Acid (VITAMIN C PO), Take 1 tablet by mouth daily at 2 PM. , Disp: , Rfl:  .  buPROPion (WELLBUTRIN XL) 150 MG 24 hr tablet, Take 1 tablet (150 mg total) by mouth daily., Disp: 30 tablet, Rfl: 3 .  Calcium Carbonate-Vitamin D (CALTRATE 600+D PO), Take 1 tablet by mouth 2 (two) times daily., Disp: , Rfl:  .  cetirizine (ZYRTEC) 10 MG tablet, Take 10 mg by mouth daily., Disp: , Rfl:  .  famotidine (PEPCID AC MAXIMUM STRENGTH) 20 MG tablet, Take 20 mg by mouth daily at 2 PM., Disp: , Rfl:  .  Multiple Vitamins-Minerals (MULTIVITAMIN ADULTS 50+ PO), Take 1 tablet by mouth daily at 2 PM. , Disp: , Rfl:  .  pantoprazole (  PROTONIX) 40 MG tablet, Take 1 tablet (40 mg total) by mouth 2 (two) times daily., Disp: 180 tablet, Rfl: 1 .  polyethylene glycol (MIRALAX / GLYCOLAX) packet, Take 17 g by mouth daily as needed for mild constipation. , Disp: , Rfl:  .  SYNTHROID 112 MCG tablet, Take 112 mcg by mouth daily before breakfast. , Disp: , Rfl: 5 .  valACYclovir (VALTREX) 500 MG tablet, TAKE 1 TABLET(500 MG) BY MOUTH DAILY (Patient taking differently: Take 500 mg by mouth daily as needed (fever  blisters). ), Disp: 30 tablet, Rfl: 6  Allergies  Allergen Reactions  . Dilaudid [Hydromorphone Hcl] Nausea Only and Rash  . Aspirin Nausea And Vomiting      stomach upset    Objective:   BP 128/76   Pulse 80   Temp 97.6 F (36.4 C) (Oral)   Ht 5\' 2"  (1.575 m)   Wt 148 lb (67.1 kg)   SpO2 99%   BMI 27.07 kg/m  AAOx3, obviously uncomfortable NCAT, EOMI No obvious CN deficits Coloring WNL Pt is able to speak clearly, coherently without shortness of breath or increased work of breathing.  Thought process is linear.  Mood is appropriate.   Assessment and Plan:   Anxiety/depression- improved since restarting Wellbutrin.  No changes at this time  Radicular back pain- Deteriorated.  Reviewed outside MRI and pt has apparent disc protrusion at L5-S1.  She is in quite a bit of pain and doesn't have any scheduled f/u w/ neurosurg.  Asked her to call and get an appt ASAP to discuss next steps.  Will restart prednisone as pt wants to avoid opiates.  Pt expressed understanding and is in agreement w/ plan.    Annye Asa, MD 10/12/2019

## 2019-10-14 DIAGNOSIS — M5416 Radiculopathy, lumbar region: Secondary | ICD-10-CM | POA: Diagnosis not present

## 2019-10-25 DIAGNOSIS — E890 Postprocedural hypothyroidism: Secondary | ICD-10-CM | POA: Diagnosis not present

## 2019-10-26 DIAGNOSIS — M5416 Radiculopathy, lumbar region: Secondary | ICD-10-CM | POA: Diagnosis not present

## 2019-10-26 DIAGNOSIS — Z6828 Body mass index (BMI) 28.0-28.9, adult: Secondary | ICD-10-CM | POA: Diagnosis not present

## 2019-10-31 ENCOUNTER — Other Ambulatory Visit: Payer: Self-pay

## 2019-10-31 MED ORDER — VALACYCLOVIR HCL 500 MG PO TABS: ORAL_TABLET | ORAL | 6 refills | Status: DC

## 2019-11-28 DIAGNOSIS — M5416 Radiculopathy, lumbar region: Secondary | ICD-10-CM | POA: Diagnosis not present

## 2019-12-02 ENCOUNTER — Other Ambulatory Visit: Payer: Self-pay | Admitting: General Practice

## 2019-12-02 MED ORDER — PANTOPRAZOLE SODIUM 40 MG PO TBEC: 40.0000 mg | DELAYED_RELEASE_TABLET | Freq: Two times a day (BID) | ORAL | 1 refills | Status: DC

## 2019-12-02 MED ORDER — ALPRAZOLAM 0.5 MG PO TABS: ORAL_TABLET | ORAL | 1 refills | Status: DC

## 2019-12-02 NOTE — Telephone Encounter (Signed)
Last OV 10/12/19 Alprazolam last filled 09/23/19 #60 with 1

## 2019-12-20 DIAGNOSIS — M4316 Spondylolisthesis, lumbar region: Secondary | ICD-10-CM | POA: Diagnosis not present

## 2019-12-20 DIAGNOSIS — M5416 Radiculopathy, lumbar region: Secondary | ICD-10-CM | POA: Diagnosis not present

## 2019-12-21 ENCOUNTER — Telehealth: Payer: Self-pay

## 2019-12-21 NOTE — Telephone Encounter (Signed)
-----   Message from Milus Banister, MD sent at 12/21/2019  3:09 PM EDT ----- Regarding: RE: Follow up Gabe, thanks   Wynter Isaacs, can you reach out to her.  Please offer her my next available follow-up appointment to catch up with her, see how she is doing and see if she has reconsidered endoscopic polyp removal.  Thanks   ----- Message ----- From: Irving Copas., MD Sent: 12/21/2019   9:05 AM EDT To: Milus Banister, MD, Timothy Lasso, RN Subject: Follow up                                      DJ, This was a patient that we had previously discussed back in 2020 but she had issues that she did not feel comfortable with pursuing any interventions. Do you want to see her back in clinic first before I try to meet her or if she is doing better see if she wants to see me in clinic to discuss the duodenal adenoma resection? Thanks. GM

## 2019-12-22 NOTE — Telephone Encounter (Signed)
Thank you for update. I would make sure that she has the understanding and results of the endoscopy and biopsies and the reason that follow-up was indicated for this precancerous lesion.  GM

## 2019-12-22 NOTE — Telephone Encounter (Signed)
The pt was called and she tells me that she has switched GI practices.

## 2019-12-26 ENCOUNTER — Other Ambulatory Visit: Payer: Self-pay | Admitting: General Practice

## 2019-12-26 MED ORDER — BUPROPION HCL ER (XL) 150 MG PO TB24: 150.0000 mg | ORAL_TABLET | Freq: Every day | ORAL | 3 refills | Status: DC

## 2020-01-26 ENCOUNTER — Ambulatory Visit (HOSPITAL_BASED_OUTPATIENT_CLINIC_OR_DEPARTMENT_OTHER)
Admission: RE | Admit: 2020-01-26 | Discharge: 2020-01-26 | Disposition: A | Discharge status: Home / Self Care | Payer: Federal, State, Local not specified - PPO | Source: Ambulatory Visit | Attending: Family Medicine | Admitting: Family Medicine

## 2020-01-26 ENCOUNTER — Other Ambulatory Visit: Payer: Self-pay

## 2020-01-26 ENCOUNTER — Ambulatory Visit (INDEPENDENT_AMBULATORY_CARE_PROVIDER_SITE_OTHER): Payer: Federal, State, Local not specified - PPO | Admitting: Family Medicine

## 2020-01-26 ENCOUNTER — Other Ambulatory Visit (INDEPENDENT_AMBULATORY_CARE_PROVIDER_SITE_OTHER): Payer: Federal, State, Local not specified - PPO

## 2020-01-26 ENCOUNTER — Encounter: Payer: Self-pay | Admitting: Family Medicine

## 2020-01-26 VITALS — BP 124/80 | HR 84 | Temp 97.6°F | Resp 16 | Ht 62.0 in | Wt 164.2 lb

## 2020-01-26 DIAGNOSIS — R053 Chronic cough: Secondary | ICD-10-CM

## 2020-01-26 DIAGNOSIS — R05 Cough: Secondary | ICD-10-CM | POA: Diagnosis not present

## 2020-01-26 DIAGNOSIS — F17200 Nicotine dependence, unspecified, uncomplicated: Secondary | ICD-10-CM | POA: Diagnosis not present

## 2020-01-26 DIAGNOSIS — Z0000 Encounter for general adult medical examination without abnormal findings: Secondary | ICD-10-CM

## 2020-01-26 DIAGNOSIS — E669 Obesity, unspecified: Secondary | ICD-10-CM

## 2020-01-26 DIAGNOSIS — Z23 Encounter for immunization: Secondary | ICD-10-CM

## 2020-01-26 DIAGNOSIS — E538 Deficiency of other specified B group vitamins: Secondary | ICD-10-CM

## 2020-01-26 DIAGNOSIS — M8580 Other specified disorders of bone density and structure, unspecified site: Secondary | ICD-10-CM

## 2020-01-26 LAB — HEPATIC FUNCTION PANEL
ALT: 15 U/L (ref 0–35)
AST: 19 U/L (ref 0–37)
Albumin: 4.2 g/dL (ref 3.5–5.2)
Alkaline Phosphatase: 87 U/L (ref 39–117)
Bilirubin, Direct: 0.1 mg/dL (ref 0.0–0.3)
Total Bilirubin: 0.3 mg/dL (ref 0.2–1.2)
Total Protein: 6.7 g/dL (ref 6.0–8.3)

## 2020-01-26 LAB — BASIC METABOLIC PANEL
BUN: 13 mg/dL (ref 6–23)
CO2: 28 mEq/L (ref 19–32)
Calcium: 9.9 mg/dL (ref 8.4–10.5)
Chloride: 105 mEq/L (ref 96–112)
Creatinine, Ser: 0.75 mg/dL (ref 0.40–1.20)
GFR: 78.46 mL/min (ref >60.00)
Glucose, Bld: 82 mg/dL (ref 70–99)
Potassium: 4.1 mEq/L (ref 3.5–5.1)
Sodium: 140 mEq/L (ref 135–145)

## 2020-01-26 LAB — LIPID PANEL
Cholesterol: 225 mg/dL — ABNORMAL HIGH (ref 0–200)
HDL: 77.2 mg/dL (ref >39.00)
LDL Cholesterol: 132 mg/dL — ABNORMAL HIGH (ref 0–99)
NonHDL: 147.73
Total CHOL/HDL Ratio: 3
Triglycerides: 79 mg/dL (ref 0.0–149.0)
VLDL: 15.8 mg/dL (ref 0.0–40.0)

## 2020-01-26 LAB — CBC WITH DIFFERENTIAL/PLATELET
Basophils Absolute: 0 10*3/uL (ref 0.0–0.1)
Basophils Relative: 0.4 % (ref 0.0–3.0)
Eosinophils Absolute: 0.1 10*3/uL (ref 0.0–0.7)
Eosinophils Relative: 1.3 % (ref 0.0–5.0)
HCT: 38.9 % (ref 36.0–46.0)
Hemoglobin: 12.8 g/dL (ref 12.0–15.0)
Lymphocytes Relative: 25.8 % (ref 12.0–46.0)
Lymphs Abs: 1.8 10*3/uL (ref 0.7–4.0)
MCHC: 32.9 g/dL (ref 30.0–36.0)
MCV: 91.3 fl (ref 78.0–100.0)
Monocytes Absolute: 0.5 10*3/uL (ref 0.1–1.0)
Monocytes Relative: 7.6 % (ref 3.0–12.0)
Neutro Abs: 4.6 10*3/uL (ref 1.4–7.7)
Neutrophils Relative %: 64.9 % (ref 43.0–77.0)
Platelets: 248 10*3/uL (ref 150.0–400.0)
RBC: 4.26 Mil/uL (ref 3.87–5.11)
RDW: 14.1 % (ref 11.5–15.5)
WBC: 7.1 10*3/uL (ref 4.0–10.5)

## 2020-01-26 LAB — VITAMIN B12: Vitamin B-12: 567 pg/mL (ref 211–911)

## 2020-01-26 LAB — VITAMIN D 25 HYDROXY (VIT D DEFICIENCY, FRACTURES): VITD: 73.52 ng/mL (ref 30.00–100.00)

## 2020-01-26 LAB — TSH: TSH: 2.78 u[IU]/mL (ref 0.35–4.50)

## 2020-01-26 NOTE — Assessment & Plan Note (Signed)
Check labs and replete prn. 

## 2020-01-26 NOTE — Assessment & Plan Note (Signed)
Check Vit D level and replete prn.  DEXA ordered at [facility] (pt preference)

## 2020-01-26 NOTE — Assessment & Plan Note (Signed)
Ongoing issue for pt.  She feels it is GI related but would like a CXR for peace of mind.  CXR ordered.

## 2020-01-26 NOTE — Patient Instructions (Signed)
Follow up in 1 year or as needed We'll notify you of your lab results and make any changes if needed Continue to work on healthy diet and regular exercise- you can do it! You can go to the [facility] at any time for the chest xray but we will need to schedule the bone density (orders are in) Call with any questions or concerns Hang in there!

## 2020-01-26 NOTE — Addendum Note (Signed)
Addended by: Fritz Pickerel on: 01/26/2020 12:08 PM   Modules accepted: Orders

## 2020-01-26 NOTE — Assessment & Plan Note (Signed)
Pt's PE WNL w/ exception of obesity and known back problems.  UTD on pap, colonoscopy.  DEXA ordered today.  Tdap updated.  Check labs.  Anticipatory guidance provided.

## 2020-01-26 NOTE — Addendum Note (Signed)
Addended by: Fritz Pickerel on: 01/26/2020 11:34 AM   Modules accepted: Orders

## 2020-01-26 NOTE — Progress Notes (Signed)
   Subjective:    Patient ID: [name] [name], female    DOB: August 16, 1960, 59 y.o.   MRN: [id number]  HPI CPE- UTD on pap, no need for mammo due to bilateral mastectomy.  UTD on colonoscopy.  UTD on COVID vaccine.  Due for Tdap.  Reviewed past medical, surgical, family and social histories.    Review of Systems Patient reports no vision/ hearing changes, adenopathy,fever, persistant/recurrent hoarseness, chest pain, palpitations, edema, hemoptysis, dyspnea (rest/exertional/paroxysmal nocturnal), gastrointestinal bleeding (melena, rectal bleeding), abdominal pain, significant heartburn, bowel changes, GU symptoms (dysuria, hematuria, incontinence), Gyn symptoms (abnormal  bleeding, pain),  syncope, focal weakness, memory loss, skin/hair/nail changes, abnormal bruising or bleeding, anxiety, or depression.   + weight gain- 16 lbs since last visit + back pain- pt needs to have surgery but is unable to do this right now, seeing pain clinic for injxns, she is not able to exercise due to her back pain + ongoing cough- pt feels this is GERD related but she would like a CXR to assess + dysphagia- has GI f/u pending + bilateral numbness of feet due to back  This visit occurred during the SARS-CoV-2 public health emergency.  Safety protocols were in place, including screening questions prior to the visit, additional usage of staff PPE, and extensive cleaning of exam room while observing appropriate contact time as indicated for disinfecting solutions.      Objective:   Physical Exam General Appearance:    Alert, cooperative, no distress, appears stated age, obese  Head:    Normocephalic, without obvious abnormality, atraumatic  Eyes:    PERRL, conjunctiva/corneas clear, EOM's intact, fundi    benign, both eyes  Ears:    Normal TM's and external ear canals, both ears  Nose:   Deferred due to COVID  Throat:   Neck:   Supple, symmetrical, trachea midline, no adenopathy;    Thyroid: no  enlargement/tenderness/nodules  Back:     Symmetric, no curvature, ROM normal, no CVA tenderness  Lungs:     Clear to auscultation bilaterally, respirations unlabored  Chest Wall:    No tenderness or deformity   Heart:    Regular rate and rhythm, S1 and S2 normal, no murmur, rub   or gallop  Breast Exam:    Deferred  Abdomen:     Soft, non-tender, bowel sounds active all four quadrants,    no masses, no organomegaly  Genitalia:    Deferred  Rectal:    Extremities:   Extremities normal, atraumatic, no cyanosis or edema  Pulses:   2+ and symmetric all extremities  Skin:   Skin color, texture, turgor normal, no rashes or lesions  Lymph nodes:   Cervical, supraclavicular, and axillary nodes normal  Neurologic:   CNII-XII intact, antalgic gait          Assessment & Plan:

## 2020-01-26 NOTE — Assessment & Plan Note (Signed)
Pt has gained 16 lbs since April.  Stressed need for healthy diet and exercise as able (limited due to ongoing back issues).  Check labs to risk stratify.  Will follow.

## 2020-01-27 ENCOUNTER — Encounter: Payer: Self-pay | Admitting: General Practice

## 2020-01-30 IMAGING — XA DG FLUORO GUIDE NDL PLC/BX
1 series · 1 of 1 positions shown · non-contrast
Comparison: none

CLINICAL DATA: Wrist pain.

[Series 1: ortho standard · 1 of 1 slices shown]
[im 1/1]
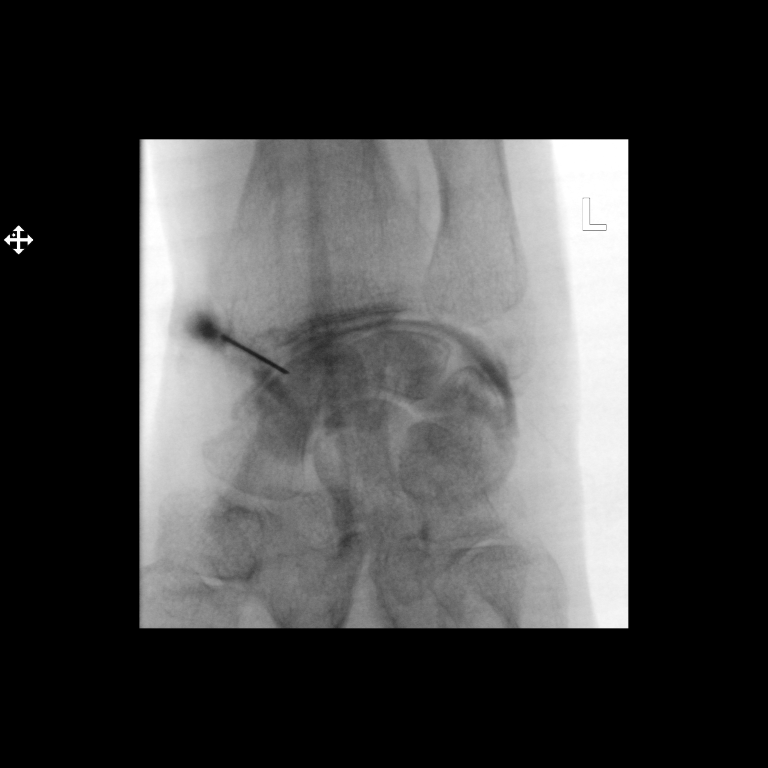

[1 of 1 positions shown; findings below may reference images not displayed]

FLUOROSCOPY TIME:  35 seconds corresponding to a Dose Area Product
of 2.8 Gy*m2

PROCEDURE:
LEFT WRIST INJECTION FOR MRI

After a thorough discussion of risks and benefits of the procedure,
written and oral informed consent was obtained. Verbal consent was
obtained by Dr. WIDJAJA. Time out form Completed when appropriate. We
discussed the high likelihood of good diagnostic study.

Preliminary localization was performed over the LEFT wrist. The area
was marked over dorsal scaphoid.

After prep and drape in the usual sterile fashion, the skin and
deeper subcutaneous tissues were anesthetized with 1% Lidocaine
without Epinephrine. Under fluoroscopic guidance, a 23 gauge
hypodermic needle was advanced into the joint between distal radius
using a dorsal approach. The joint was distended with 2.0 ml of a
[DATE] dilution of Multihance contrast. The MR arthrogram solution
was as follows: 2.5 mL of Isovue M 200 0.05 mL Multihance, 7.5 ml of
Lidocaine. After opacification of the joint, injection was
discontinued, the needle removed, and a sterile dressing applied.
The patient was taken to MRI for subsequent imaging.

The patient tolerated the procedure well and there were no
complications.
IMPRESSION: Successful LEFT wrist fluoroscopically guided injection.

## 2020-01-31 ENCOUNTER — Telehealth: Payer: Self-pay | Admitting: Family Medicine

## 2020-01-31 DIAGNOSIS — Z0279 Encounter for issue of other medical certificate: Secondary | ICD-10-CM

## 2020-01-31 NOTE — Progress Notes (Signed)
Called pt and lmovm to return call.

## 2020-01-31 NOTE — Telephone Encounter (Signed)
Patient has called [name] Billow B. Back in regard to x ray.    Patient has seen Dr. Vir[facility] response in [place].    Did not have any other questions or concerns in regard at this time.

## 2020-02-01 ENCOUNTER — Encounter: Payer: Self-pay | Admitting: General Practice

## 2020-02-01 DIAGNOSIS — D2261 Melanocytic nevi of right upper limb, including shoulder: Secondary | ICD-10-CM | POA: Diagnosis not present

## 2020-02-01 NOTE — Progress Notes (Signed)
Called pt and lmovm to return call.  Letter also mailed.

## 2020-02-02 ENCOUNTER — Other Ambulatory Visit: Payer: Self-pay | Admitting: Family Medicine

## 2020-02-02 NOTE — Telephone Encounter (Signed)
Last OV 01/26/20 Alprazolam last filled 12/02/19 #60 with 1

## 2020-02-03 ENCOUNTER — Ambulatory Visit (HOSPITAL_BASED_OUTPATIENT_CLINIC_OR_DEPARTMENT_OTHER)
Admission: RE | Admit: 2020-02-03 | Discharge: 2020-02-03 | Disposition: A | Discharge status: Home / Self Care | Payer: Federal, State, Local not specified - PPO | Source: Ambulatory Visit | Attending: Family Medicine | Admitting: Family Medicine

## 2020-02-03 ENCOUNTER — Other Ambulatory Visit: Payer: Self-pay

## 2020-02-03 DIAGNOSIS — M8580 Other specified disorders of bone density and structure, unspecified site: Secondary | ICD-10-CM | POA: Diagnosis not present

## 2020-02-03 DIAGNOSIS — R569 Unspecified convulsions: Secondary | ICD-10-CM | POA: Diagnosis not present

## 2020-02-03 DIAGNOSIS — Z87891 Personal history of nicotine dependence: Secondary | ICD-10-CM | POA: Diagnosis not present

## 2020-02-03 DIAGNOSIS — M8589 Other specified disorders of bone density and structure, multiple sites: Secondary | ICD-10-CM | POA: Diagnosis not present

## 2020-02-03 NOTE — Progress Notes (Signed)
Called pt and lmovm to return call.

## 2020-02-06 DIAGNOSIS — M5416 Radiculopathy, lumbar region: Secondary | ICD-10-CM | POA: Diagnosis not present

## 2020-02-13 DIAGNOSIS — M25862 Other specified joint disorders, left knee: Secondary | ICD-10-CM | POA: Diagnosis not present

## 2020-02-13 DIAGNOSIS — M25562 Pain in left knee: Secondary | ICD-10-CM | POA: Diagnosis not present

## 2020-02-29 DIAGNOSIS — I872 Venous insufficiency (chronic) (peripheral): Secondary | ICD-10-CM | POA: Diagnosis not present

## 2020-02-29 DIAGNOSIS — I83813 Varicose veins of bilateral lower extremities with pain: Secondary | ICD-10-CM | POA: Diagnosis not present

## 2020-03-05 DIAGNOSIS — M4316 Spondylolisthesis, lumbar region: Secondary | ICD-10-CM | POA: Diagnosis not present

## 2020-03-05 DIAGNOSIS — M5416 Radiculopathy, lumbar region: Secondary | ICD-10-CM | POA: Diagnosis not present

## 2020-03-06 DIAGNOSIS — M25462 Effusion, left knee: Secondary | ICD-10-CM | POA: Diagnosis not present

## 2020-03-06 DIAGNOSIS — M23362 Other meniscus derangements, other lateral meniscus, left knee: Secondary | ICD-10-CM | POA: Diagnosis not present

## 2020-03-06 DIAGNOSIS — M94262 Chondromalacia, left knee: Secondary | ICD-10-CM | POA: Diagnosis not present

## 2020-03-06 DIAGNOSIS — S83282A Other tear of lateral meniscus, current injury, left knee, initial encounter: Secondary | ICD-10-CM | POA: Diagnosis not present

## 2020-03-06 DIAGNOSIS — R936 Abnormal findings on diagnostic imaging of limbs: Secondary | ICD-10-CM | POA: Diagnosis not present

## 2020-03-30 ENCOUNTER — Other Ambulatory Visit: Payer: Self-pay

## 2020-03-30 ENCOUNTER — Ambulatory Visit: Payer: Federal, State, Local not specified - PPO | Admitting: Family Medicine

## 2020-03-30 ENCOUNTER — Encounter: Payer: Self-pay | Admitting: Family Medicine

## 2020-03-30 VITALS — BP 120/70 | HR 94 | Temp 97.3°F | Ht 62.0 in | Wt 166.8 lb

## 2020-03-30 DIAGNOSIS — M25551 Pain in right hip: Secondary | ICD-10-CM

## 2020-03-30 DIAGNOSIS — Z23 Encounter for immunization: Secondary | ICD-10-CM

## 2020-03-30 MED ORDER — PREDNISONE 10 MG PO TABS: ORAL_TABLET | ORAL | 0 refills | Status: DC

## 2020-03-30 MED ORDER — METHOCARBAMOL 500 MG PO TABS: 500.0000 mg | ORAL_TABLET | Freq: Three times a day (TID) | ORAL | 0 refills | Status: DC | PRN

## 2020-03-30 NOTE — Patient Instructions (Addendum)
Follow up as needed or as scheduled This seems to be trochanteric bursitis We'll call you with your Ortho appt ICE! Take the Prednisone as directed- w/ food. Use the Methocarbamol as needed for pain/spasm Call with any questions or concerns Hang in there!!!

## 2020-03-30 NOTE — Progress Notes (Signed)
   Subjective:    Patient ID: [name] [name], female    DOB: 1961-04-23, 59 y.o.   MRN: [id number]  HPI Hip pain- R sided.  She fell on L knee in July and feels that maybe she was compensating but putting more weight on R side.  Sxs started ~3 weeks ago.  Painful to get up from chair.  Unable to stand on R leg alone, unable to sit '[place] style'.  Pain is predominately posterolateral thigh.  Pt feels like leg is going to give out and she notices she's not lifting leg when walking- 'I seem to drag it'.  No relief w/ tylenol/ibuprofen, heat/ice.  Does not have Ortho.   Review of Systems For ROS see HPI   This visit occurred during the SARS-CoV-2 public health emergency.  Safety protocols were in place, including screening questions prior to the visit, additional usage of staff PPE, and extensive cleaning of exam room while observing appropriate contact time as indicated for disinfecting solutions.       Objective:   Physical Exam Vitals reviewed.  Constitutional:      Appearance: Normal appearance. She is not ill-appearing or toxic-appearing.     Comments: Obviously uncomfortable  Cardiovascular:     Pulses: Normal pulses.  Musculoskeletal:        General: Tenderness (TTP over R greater trochanteric bursa) present.     Comments: Full flexion/extension of R hip Limited and painful external rotation Full internal rotation  Skin:    General: Skin is warm and dry.  Neurological:     General: No focal deficit present.     Mental Status: She is alert and oriented to person, place, and time.     Motor: Weakness (R hip weakness upon standing and w/ each step) present.     Gait: Gait abnormal (antalgic gait).           Assessment & Plan:  R hip pain- new.  Pt seemingly has trochanteric bursitis given the TTP over her greater trochanter.  Will start Prednisone taper, Methocarbamol 500mg  TID prn.  Ice.  Ortho referral.  Reviewed supportive care and red flags that should prompt return.   Pt expressed understanding and is in agreement w/ plan.

## 2020-04-16 ENCOUNTER — Other Ambulatory Visit: Payer: Self-pay | Admitting: Family Medicine

## 2020-04-16 NOTE — Telephone Encounter (Signed)
Last OV 03/30/20 Alprazolam last filled 02/02/20 #60 with 1

## 2020-04-17 DIAGNOSIS — M7061 Trochanteric bursitis, right hip: Secondary | ICD-10-CM | POA: Diagnosis not present

## 2020-04-17 DIAGNOSIS — M2242 Chondromalacia patellae, left knee: Secondary | ICD-10-CM | POA: Diagnosis not present

## 2020-04-26 ENCOUNTER — Other Ambulatory Visit: Payer: Self-pay | Admitting: Family Medicine

## 2020-04-27 DIAGNOSIS — R110 Nausea: Secondary | ICD-10-CM | POA: Diagnosis not present

## 2020-04-27 DIAGNOSIS — K5900 Constipation, unspecified: Secondary | ICD-10-CM | POA: Diagnosis not present

## 2020-04-27 DIAGNOSIS — R140 Abdominal distension (gaseous): Secondary | ICD-10-CM | POA: Diagnosis not present

## 2020-04-27 DIAGNOSIS — Z86010 Personal history of colonic polyps: Secondary | ICD-10-CM | POA: Diagnosis not present

## 2020-04-30 DIAGNOSIS — D2261 Melanocytic nevi of right upper limb, including shoulder: Secondary | ICD-10-CM | POA: Diagnosis not present

## 2020-05-14 DIAGNOSIS — Z1211 Encounter for screening for malignant neoplasm of colon: Secondary | ICD-10-CM | POA: Diagnosis not present

## 2020-05-14 DIAGNOSIS — D132 Benign neoplasm of duodenum: Secondary | ICD-10-CM | POA: Diagnosis not present

## 2020-05-14 DIAGNOSIS — D131 Benign neoplasm of stomach: Secondary | ICD-10-CM | POA: Diagnosis not present

## 2020-05-22 DIAGNOSIS — M2242 Chondromalacia patellae, left knee: Secondary | ICD-10-CM | POA: Diagnosis not present

## 2020-05-22 DIAGNOSIS — M7061 Trochanteric bursitis, right hip: Secondary | ICD-10-CM | POA: Diagnosis not present

## 2020-05-28 DIAGNOSIS — I83813 Varicose veins of bilateral lower extremities with pain: Secondary | ICD-10-CM | POA: Diagnosis not present

## 2020-06-04 DIAGNOSIS — L308 Other specified dermatitis: Secondary | ICD-10-CM | POA: Diagnosis not present

## 2020-06-04 DIAGNOSIS — L089 Local infection of the skin and subcutaneous tissue, unspecified: Secondary | ICD-10-CM | POA: Diagnosis not present

## 2020-06-06 DIAGNOSIS — I83813 Varicose veins of bilateral lower extremities with pain: Secondary | ICD-10-CM | POA: Diagnosis not present

## 2020-06-12 DIAGNOSIS — I83813 Varicose veins of bilateral lower extremities with pain: Secondary | ICD-10-CM | POA: Diagnosis not present

## 2020-06-22 DIAGNOSIS — I83813 Varicose veins of bilateral lower extremities with pain: Secondary | ICD-10-CM | POA: Diagnosis not present

## 2020-07-04 ENCOUNTER — Other Ambulatory Visit: Payer: Self-pay | Admitting: Family Medicine

## 2020-07-04 DIAGNOSIS — I83813 Varicose veins of bilateral lower extremities with pain: Secondary | ICD-10-CM | POA: Diagnosis not present

## 2020-07-04 NOTE — Telephone Encounter (Signed)
Alprazolam LFD 04/16/20 #60 with 1 refill Valacyclovir LFD 10/31/19 #30 with 6 refills LOV 03/30/20 for hip pain NOV 01/28/21

## 2020-07-13 ENCOUNTER — Telehealth (INDEPENDENT_AMBULATORY_CARE_PROVIDER_SITE_OTHER): Payer: Federal, State, Local not specified - PPO | Admitting: Family Medicine

## 2020-07-13 ENCOUNTER — Encounter: Payer: Self-pay | Admitting: Family Medicine

## 2020-07-13 VITALS — BP 124/76 | Temp 98.2°F | Resp 18 | Wt 162.5 lb

## 2020-07-13 DIAGNOSIS — F32A Depression, unspecified: Secondary | ICD-10-CM

## 2020-07-13 DIAGNOSIS — R110 Nausea: Secondary | ICD-10-CM

## 2020-07-13 DIAGNOSIS — F419 Anxiety disorder, unspecified: Secondary | ICD-10-CM

## 2020-07-13 MED ORDER — ONDANSETRON HCL 4 MG PO TABS: 4.0000 mg | ORAL_TABLET | Freq: Three times a day (TID) | ORAL | 0 refills | Status: DC | PRN

## 2020-07-13 MED ORDER — BUPROPION HCL ER (XL) 300 MG PO TB24: 300.0000 mg | ORAL_TABLET | Freq: Every day | ORAL | 3 refills | Status: DC

## 2020-07-13 NOTE — Progress Notes (Signed)
I connected with  [name] [name] on 07/13/20 by a video enabled telemedicine application and verified that I am speaking with the correct person using two identifiers.   I discussed the limitations of evaluation and management by telemedicine. The patient expressed understanding and agreed to proceed.
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
## 2020-07-23 DIAGNOSIS — I83813 Varicose veins of bilateral lower extremities with pain: Secondary | ICD-10-CM | POA: Diagnosis not present

## 2020-08-01 DIAGNOSIS — I83813 Varicose veins of bilateral lower extremities with pain: Secondary | ICD-10-CM | POA: Diagnosis not present

## 2020-08-09 ENCOUNTER — Telehealth: Payer: Self-pay | Admitting: Family Medicine

## 2020-08-09 ENCOUNTER — Telehealth: Payer: Self-pay

## 2020-08-09 NOTE — Telephone Encounter (Signed)
I first recommend OTC Delsym or Robitussin.  If cough is not improving on OTC meds, will need visit to discuss.

## 2020-08-09 NOTE — Telephone Encounter (Signed)
Called and spoke with patient regarding her cough. Per provider pt is to take OTC Delsym and Robitussin. If cough does not get better after taking these then call office and schedule a visit.

## 2020-08-09 NOTE — Telephone Encounter (Signed)
Patient calls and has been coughing for the last 2 days - she has been tested twice a week at work and they were both negative.  She wants to know if you could call her in something just for the cough.

## 2020-08-13 DIAGNOSIS — Z20828 Contact with and (suspected) exposure to other viral communicable diseases: Secondary | ICD-10-CM | POA: Diagnosis not present

## 2020-08-14 DIAGNOSIS — I83813 Varicose veins of bilateral lower extremities with pain: Secondary | ICD-10-CM | POA: Diagnosis not present

## 2020-08-15 ENCOUNTER — Telehealth (INDEPENDENT_AMBULATORY_CARE_PROVIDER_SITE_OTHER): Payer: Federal, State, Local not specified - PPO | Admitting: Family Medicine

## 2020-08-15 ENCOUNTER — Encounter: Payer: Self-pay | Admitting: Family Medicine

## 2020-08-15 VITALS — BP 126/68 | HR 70 | Temp 97.2°F | Resp 16

## 2020-08-15 DIAGNOSIS — F32A Depression, unspecified: Secondary | ICD-10-CM

## 2020-08-15 DIAGNOSIS — F419 Anxiety disorder, unspecified: Secondary | ICD-10-CM | POA: Diagnosis not present

## 2020-08-15 DIAGNOSIS — R058 Other specified cough: Secondary | ICD-10-CM

## 2020-08-15 MED ORDER — AZITHROMYCIN 250 MG PO TABS: ORAL_TABLET | ORAL | 0 refills | Status: DC

## 2020-08-15 MED ORDER — ALBUTEROL SULFATE HFA 108 (90 BASE) MCG/ACT IN AERS: 2.0000 | INHALATION_SPRAY | Freq: Four times a day (QID) | RESPIRATORY_TRACT | 0 refills | Status: DC | PRN

## 2020-08-15 NOTE — Progress Notes (Signed)
I connected with  [name] [name] on 08/15/20 by a video enabled telemedicine application and verified that I am speaking with the correct person using two identifiers.   I discussed the limitations of evaluation and management by telemedicine. The patient expressed understanding and agreed to proceed.
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
## 2020-08-23 DIAGNOSIS — I83813 Varicose veins of bilateral lower extremities with pain: Secondary | ICD-10-CM | POA: Diagnosis not present

## 2020-09-03 DIAGNOSIS — I83813 Varicose veins of bilateral lower extremities with pain: Secondary | ICD-10-CM | POA: Diagnosis not present

## 2020-09-06 ENCOUNTER — Other Ambulatory Visit: Payer: Self-pay | Admitting: Family Medicine

## 2020-09-07 NOTE — Telephone Encounter (Signed)
Requesting:Xanax 0.5mg  Contract: UDS: Last Visit:08/15/20 v/v Next Visit:02/07/21 Last Refill:07/04/20 60 tabs 1 refill  Please Advise

## 2020-09-11 DIAGNOSIS — I83813 Varicose veins of bilateral lower extremities with pain: Secondary | ICD-10-CM | POA: Diagnosis not present

## 2020-09-19 DIAGNOSIS — I83813 Varicose veins of bilateral lower extremities with pain: Secondary | ICD-10-CM | POA: Diagnosis not present

## 2020-09-20 DIAGNOSIS — E890 Postprocedural hypothyroidism: Secondary | ICD-10-CM | POA: Diagnosis not present

## 2020-11-05 ENCOUNTER — Other Ambulatory Visit: Payer: Self-pay

## 2020-11-05 DIAGNOSIS — F32A Depression, unspecified: Secondary | ICD-10-CM

## 2020-11-05 DIAGNOSIS — F419 Anxiety disorder, unspecified: Secondary | ICD-10-CM

## 2020-11-05 MED ORDER — BUPROPION HCL ER (XL) 300 MG PO TB24: 300.0000 mg | ORAL_TABLET | Freq: Every day | ORAL | 3 refills | Status: DC

## 2020-11-06 ENCOUNTER — Other Ambulatory Visit: Payer: Self-pay | Admitting: Family Medicine

## 2020-11-06 NOTE — Telephone Encounter (Signed)
LFD 09/07/20 #60 with 1 refill LOV  08/15/20 NOV 02/07/21

## 2020-11-26 DIAGNOSIS — M7061 Trochanteric bursitis, right hip: Secondary | ICD-10-CM | POA: Diagnosis not present

## 2020-11-26 DIAGNOSIS — M7062 Trochanteric bursitis, left hip: Secondary | ICD-10-CM | POA: Diagnosis not present

## 2020-11-27 DIAGNOSIS — M7061 Trochanteric bursitis, right hip: Secondary | ICD-10-CM | POA: Diagnosis not present

## 2020-12-13 DIAGNOSIS — I83813 Varicose veins of bilateral lower extremities with pain: Secondary | ICD-10-CM | POA: Diagnosis not present

## 2020-12-13 DIAGNOSIS — I872 Venous insufficiency (chronic) (peripheral): Secondary | ICD-10-CM | POA: Diagnosis not present

## 2020-12-14 ENCOUNTER — Other Ambulatory Visit: Payer: Self-pay

## 2020-12-14 DIAGNOSIS — K2100 Gastro-esophageal reflux disease with esophagitis, without bleeding: Secondary | ICD-10-CM

## 2020-12-14 MED ORDER — PANTOPRAZOLE SODIUM 40 MG PO TBEC: 40.0000 mg | DELAYED_RELEASE_TABLET | Freq: Two times a day (BID) | ORAL | 1 refills | Status: DC

## 2020-12-18 DIAGNOSIS — E890 Postprocedural hypothyroidism: Secondary | ICD-10-CM | POA: Diagnosis not present

## 2020-12-21 ENCOUNTER — Telehealth: Payer: Self-pay | Admitting: Family Medicine

## 2020-12-21 ENCOUNTER — Other Ambulatory Visit: Payer: Self-pay

## 2020-12-21 DIAGNOSIS — K2100 Gastro-esophageal reflux disease with esophagitis, without bleeding: Secondary | ICD-10-CM

## 2020-12-21 MED ORDER — PANTOPRAZOLE SODIUM 40 MG PO TBEC: 40.0000 mg | DELAYED_RELEASE_TABLET | Freq: Two times a day (BID) | ORAL | 1 refills | Status: DC

## 2020-12-21 NOTE — Telephone Encounter (Signed)
Refill sent to pharmacy.   

## 2020-12-21 NOTE — Telephone Encounter (Signed)
..  Medication Refills  Last OV:  Medication:  Protonix - generic  Pharmacy:  [facility] at [place]  Let patient know to contact pharmacy at the end of the day to make sure medication is ready.   Please notify patient to allow 48-72 hours to process.  Encourage patient to contact the pharmacy for refills or they can request refills through [place] out below:   Last refill:  QTY:  Refill Date:    Other Comments:  Patient states that pharmacy said that it must have prior approval.  Please send.   Okay for refill?  Please advise.

## 2021-01-02 ENCOUNTER — Encounter: Payer: Self-pay | Admitting: *Deleted

## 2021-01-09 ENCOUNTER — Telehealth: Payer: Self-pay | Admitting: Family Medicine

## 2021-01-09 NOTE — Telephone Encounter (Signed)
..  Type of form received:  FMLA  Additional comments:   Received by:  Tye Savoy should be Faxed to:  Form should be mailed to:    Is patient requesting call for pickup:Yes - [phone]   Form placed:  In Dr. Virgil Benedict bin up front  Attach charge sheet.  Provider will determine charge.  Individual made aware of 3-5 business day turn around (Y/N)?yes

## 2021-01-14 ENCOUNTER — Other Ambulatory Visit: Payer: Self-pay | Admitting: Family Medicine

## 2021-01-14 NOTE — Telephone Encounter (Signed)
Last filled 11/06/20 #60 with 1 refill Last visit 08/15/20 [facility] visit 03/26/21

## 2021-01-21 NOTE — Telephone Encounter (Signed)
Patient called back wanting to know if her paperwork as been completed.  Please advise

## 2021-01-21 NOTE — Telephone Encounter (Signed)
Spoke with patient and informed her that  her FMLA forms are still being completed and will  update her when finished. Patient understood. No further concerns at this time.

## 2021-01-22 NOTE — Telephone Encounter (Signed)
Forms complete.

## 2021-01-22 NOTE — Telephone Encounter (Signed)
Spoke with patient and informed her that per Dr Birdie Riddle her forms are filled. Patient voiced understanding and stated that she wanted to come here and pick up forms tomorrow. I informed her that the forms will be held up front.

## 2021-01-27 ENCOUNTER — Encounter: Payer: Self-pay | Admitting: Gastroenterology

## 2021-01-28 ENCOUNTER — Encounter: Payer: Federal, State, Local not specified - PPO | Admitting: Family Medicine

## 2021-01-29 ENCOUNTER — Other Ambulatory Visit: Payer: Self-pay | Admitting: Family Medicine

## 2021-01-29 DIAGNOSIS — F32A Depression, unspecified: Secondary | ICD-10-CM

## 2021-01-29 DIAGNOSIS — F419 Anxiety disorder, unspecified: Secondary | ICD-10-CM

## 2021-02-07 ENCOUNTER — Encounter: Payer: Federal, State, Local not specified - PPO | Admitting: Family Medicine

## 2021-03-04 ENCOUNTER — Other Ambulatory Visit: Payer: Self-pay | Admitting: Family Medicine

## 2021-03-04 DIAGNOSIS — F32A Depression, unspecified: Secondary | ICD-10-CM

## 2021-03-13 ENCOUNTER — Other Ambulatory Visit: Payer: Self-pay | Admitting: Family Medicine

## 2021-03-13 NOTE — Telephone Encounter (Signed)
LFD 01/14/21 #60 with 1 refill LOV 08/15/20 NOV 03/26/21

## 2021-03-15 ENCOUNTER — Telehealth: Payer: Self-pay | Admitting: Family Medicine

## 2021-03-15 NOTE — Telephone Encounter (Signed)
LFD 07/04/20 #30 with 6 refills LOV 08/15/20 NOV 03/26/21

## 2021-03-26 ENCOUNTER — Encounter: Payer: Self-pay | Admitting: Family Medicine

## 2021-03-26 ENCOUNTER — Other Ambulatory Visit: Payer: Self-pay

## 2021-03-26 ENCOUNTER — Ambulatory Visit (INDEPENDENT_AMBULATORY_CARE_PROVIDER_SITE_OTHER): Payer: Federal, State, Local not specified - PPO | Admitting: Family Medicine

## 2021-03-26 VITALS — BP 118/78 | HR 84 | Temp 97.5°F | Resp 16 | Ht 62.0 in | Wt 162.4 lb

## 2021-03-26 DIAGNOSIS — E785 Hyperlipidemia, unspecified: Secondary | ICD-10-CM

## 2021-03-26 DIAGNOSIS — Z23 Encounter for immunization: Secondary | ICD-10-CM

## 2021-03-26 DIAGNOSIS — Z0000 Encounter for general adult medical examination without abnormal findings: Secondary | ICD-10-CM

## 2021-03-26 DIAGNOSIS — M8580 Other specified disorders of bone density and structure, unspecified site: Secondary | ICD-10-CM

## 2021-03-26 DIAGNOSIS — E038 Other specified hypothyroidism: Secondary | ICD-10-CM | POA: Diagnosis not present

## 2021-03-26 LAB — CBC WITH DIFFERENTIAL/PLATELET
Basophils Absolute: 0 10*3/uL (ref 0.0–0.1)
Basophils Relative: 0.3 % (ref 0.0–3.0)
Eosinophils Absolute: 0.1 10*3/uL (ref 0.0–0.7)
Eosinophils Relative: 1.6 % (ref 0.0–5.0)
HCT: 41.6 % (ref 36.0–46.0)
Hemoglobin: 13.6 g/dL (ref 12.0–15.0)
Lymphocytes Relative: 29.3 % (ref 12.0–46.0)
Lymphs Abs: 2.1 10*3/uL (ref 0.7–4.0)
MCHC: 32.8 g/dL (ref 30.0–36.0)
MCV: 89.1 fl (ref 78.0–100.0)
Monocytes Absolute: 0.5 10*3/uL (ref 0.1–1.0)
Monocytes Relative: 7.6 % (ref 3.0–12.0)
Neutro Abs: 4.3 10*3/uL (ref 1.4–7.7)
Neutrophils Relative %: 61.2 % (ref 43.0–77.0)
Platelets: 236 10*3/uL (ref 150.0–400.0)
RBC: 4.67 Mil/uL (ref 3.87–5.11)
RDW: 14.7 % (ref 11.5–15.5)
WBC: 7 10*3/uL (ref 4.0–10.5)

## 2021-03-26 LAB — VITAMIN D 25 HYDROXY (VIT D DEFICIENCY, FRACTURES): VITD: 73.3 ng/mL (ref 30.00–100.00)

## 2021-03-26 LAB — TSH: TSH: 1.24 u[IU]/mL (ref 0.35–5.50)

## 2021-03-26 NOTE — Assessment & Plan Note (Signed)
Chronic problem.  Currently asymptomatic.  Check labs.  Adjust meds prn  

## 2021-03-26 NOTE — Progress Notes (Signed)
   Subjective:    Patient ID: [name] [name], female    DOB: [birth year], 60 y.o.   MRN: [id number]  HPI CPE- UTD  colonoscopy, Tdap, pap.  Due for flu and shingles  Patient Care Team    Relationship Specialty Notifications Start End  Midge Minium, MD PCP - General Family Medicine  06/10/12   Magrinat, Virgie Dad, MD Consulting Physician Oncology  05/30/15   Fanny Skates, MD Consulting Physician General Surgery  05/30/15   Malachy Mood, MD Consulting Physician Endocrinology  07/24/17   Delaney Meigs, MD  Gastroenterology  03/26/21     Health Maintenance  Topic Date Due  . Zoster Vaccines- Shingrix (1 of 2) Never done  . COVID-19 Vaccine (4 - Booster for Pfizer series) 10/24/2020  . INFLUENZA VACCINE  02/04/2021  . DEXA SCAN  02/02/2022  . PAP SMEAR-Modifier  07/23/2022  . COLONOSCOPY (Pts 45-42yrs Insurance coverage will need to be confirmed)  05/02/2025  . TETANUS/TDAP  01/25/2030  . Hepatitis C Screening  Completed  . HIV Screening  Completed  . HPV VACCINES  Aged Out      Review of Systems Patient reports no vision/ hearing changes, adenopathy,fever, weight change,  persistant/recurrent hoarseness , swallowing issues, chest pain, palpitations, edema, persistant/recurrent cough, hemoptysis, dyspnea (rest/exertional/paroxysmal nocturnal), gastrointestinal bleeding (melena, rectal bleeding), abdominal pain, significant heartburn, bowel changes, GU symptoms (dysuria, hematuria, incontinence), Gyn symptoms (abnormal  bleeding, pain),  syncope, focal weakness, memory loss, numbness & tingling, skin/hair/nail changes, abnormal bruising or bleeding.   This visit occurred during the SARS-CoV-2 public health emergency.  Safety protocols were in place, including screening questions prior to the visit, additional usage of staff PPE, and extensive cleaning of exam room while observing appropriate contact time as indicated for disinfecting solutions.      Objective:   Physical  Exam General Appearance:    Alert, cooperative, no distress, appears stated age  Head:    Normocephalic, without obvious abnormality, atraumatic  Eyes:    PERRL, conjunctiva/corneas clear, EOM's intact, fundi    benign, both eyes  Ears:    Normal TM's and external ear canals, both ears  Nose:   Deferred due to COVID  Throat:   Neck:   Supple, symmetrical, trachea midline, no adenopathy;    Thyroid: no enlargement/tenderness/nodules  Back:     Symmetric, no curvature, ROM normal, no CVA tenderness  Lungs:     Clear to auscultation bilaterally, respirations unlabored  Chest Wall:    No tenderness or deformity   Heart:    Regular rate and rhythm, S1 and S2 normal, no murmur, rub   or gallop  Breast Exam:    Deferred to GYN  Abdomen:     Soft, non-tender, bowel sounds active all four quadrants,    no masses, no organomegaly  Genitalia:    Deferred to GYN  Rectal:    Extremities:   Extremities normal, atraumatic, no cyanosis or edema  Pulses:   2+ and symmetric all extremities  Skin:   Skin color, texture, turgor normal, no rashes or lesions  Lymph nodes:   Cervical, supraclavicular, and axillary nodes normal  Neurologic:   CNII-XII intact, normal strength, sensation and reflexes    throughout          Assessment & Plan:

## 2021-03-26 NOTE — Assessment & Plan Note (Signed)
Check Vit d and replete prn.

## 2021-03-26 NOTE — Assessment & Plan Note (Signed)
Pt's PE WNL w/ exception of weight.  UTD on colonoscopy, Tdap, pap.  Flu and Shingrix given today.  Check labs.  Anticipatory guidance provided.

## 2021-03-26 NOTE — Patient Instructions (Signed)
Follow up in 1 year or as needed We'll notify you of your lab results and make any changes if needed Continue to work on healthy diet and regular exercise- you can do it! Call with any questions or concerns Stay Safe!  Stay Healthy! Happy Fall!! 

## 2021-03-26 NOTE — Assessment & Plan Note (Signed)
Chronic problem.  Not currently on medication.  Check labs and start meds if needed.

## 2021-03-27 LAB — BASIC METABOLIC PANEL
BUN: 15 mg/dL (ref 6–23)
CO2: 26 mEq/L (ref 19–32)
Calcium: 10.4 mg/dL (ref 8.4–10.5)
Chloride: 100 mEq/L (ref 96–112)
Creatinine, Ser: 0.67 mg/dL (ref 0.40–1.20)
GFR: 93.6 mL/min (ref >60.00)
Glucose, Bld: 71 mg/dL (ref 70–99)
Potassium: 4.6 mEq/L (ref 3.5–5.1)
Sodium: 140 mEq/L (ref 135–145)

## 2021-03-27 LAB — LIPID PANEL
Cholesterol: 264 mg/dL — ABNORMAL HIGH (ref 0–200)
HDL: 79.2 mg/dL (ref >39.00)
LDL Cholesterol: 159 mg/dL — ABNORMAL HIGH (ref 0–99)
NonHDL: 185.1
Total CHOL/HDL Ratio: 3
Triglycerides: 132 mg/dL (ref 0.0–149.0)
VLDL: 26.4 mg/dL (ref 0.0–40.0)

## 2021-03-27 LAB — HEPATIC FUNCTION PANEL
ALT: 27 U/L (ref 0–35)
AST: 28 U/L (ref 0–37)
Albumin: 4.8 g/dL (ref 3.5–5.2)
Alkaline Phosphatase: 96 U/L (ref 39–117)
Bilirubin, Direct: 0.1 mg/dL (ref 0.0–0.3)
Total Bilirubin: 0.3 mg/dL (ref 0.2–1.2)
Total Protein: 7.8 g/dL (ref 6.0–8.3)

## 2021-03-28 DIAGNOSIS — M5416 Radiculopathy, lumbar region: Secondary | ICD-10-CM | POA: Diagnosis not present

## 2021-04-12 ENCOUNTER — Telehealth: Payer: Self-pay | Admitting: Family Medicine

## 2021-04-12 NOTE — Telephone Encounter (Signed)
Type of form received:   Reasonable accommodation form  Additional comments: Patient dropped off form to be completed  Received by:  [name] [name] should be Faxed/mailed to: (address/ fax #)  [phone]  Is patient requesting call for pickup:  Yes - Please call her at [phone]  Form placed:  In Dr. Virgil Benedict bin  Attach charge sheet.  Provider will determine charge.  Individual made aware of 3-5 business day turn around Yes?

## 2021-04-13 ENCOUNTER — Other Ambulatory Visit: Payer: Self-pay | Admitting: Family

## 2021-04-15 ENCOUNTER — Other Ambulatory Visit: Payer: Self-pay | Admitting: Family

## 2021-04-16 NOTE — Telephone Encounter (Signed)
Form completed and placed in basket  

## 2021-04-17 ENCOUNTER — Other Ambulatory Visit: Payer: Self-pay | Admitting: Family Medicine

## 2021-04-17 DIAGNOSIS — F32A Depression, unspecified: Secondary | ICD-10-CM

## 2021-04-17 DIAGNOSIS — Z0279 Encounter for issue of other medical certificate: Secondary | ICD-10-CM

## 2021-04-17 MED ORDER — ALPRAZOLAM 0.5 MG PO TABS: ORAL_TABLET | ORAL | 0 refills | Status: DC

## 2021-04-17 NOTE — Telephone Encounter (Signed)
Last office visit on 03/26/2021 Last filled: 03/13/2021 by Dutch Quint, FNP with no refills Yearly appointment scheduled for 2023

## 2021-04-17 NOTE — Telephone Encounter (Signed)
..  Caller name:Allyse Ayars  Caller callback #:  [phone]  Encourage patient to contact the pharmacy for refills or they can request refills through [place]:  (Please schedule appointment if longer than 1 year)  NEXT APPOINTMENT DATE: na  MEDICATION NAME & DOSE:  Alprazolam  Is the patient out of medication? YES/NO: Yes   PHARMACY: [facility] in [place]  Let patient know to contact pharmacy at the end of the day to make sure medication is ready.  Please notify patient to allow 48-72 hours to process  (CLINICAL TO FILL OR ROUTE PER PROTOCOLS)

## 2021-04-21 DIAGNOSIS — E118 Type 2 diabetes mellitus with unspecified complications: Secondary | ICD-10-CM | POA: Diagnosis not present

## 2021-04-21 DIAGNOSIS — Z0000 Encounter for general adult medical examination without abnormal findings: Secondary | ICD-10-CM | POA: Diagnosis not present

## 2021-04-21 DIAGNOSIS — Z13220 Encounter for screening for lipoid disorders: Secondary | ICD-10-CM | POA: Diagnosis not present

## 2021-04-21 DIAGNOSIS — Z125 Encounter for screening for malignant neoplasm of prostate: Secondary | ICD-10-CM | POA: Diagnosis not present

## 2021-04-21 DIAGNOSIS — Z1329 Encounter for screening for other suspected endocrine disorder: Secondary | ICD-10-CM | POA: Diagnosis not present

## 2021-04-22 DIAGNOSIS — M4316 Spondylolisthesis, lumbar region: Secondary | ICD-10-CM | POA: Diagnosis not present

## 2021-04-22 DIAGNOSIS — Z6829 Body mass index (BMI) 29.0-29.9, adult: Secondary | ICD-10-CM | POA: Diagnosis not present

## 2021-04-22 DIAGNOSIS — M5416 Radiculopathy, lumbar region: Secondary | ICD-10-CM | POA: Diagnosis not present

## 2021-04-22 DIAGNOSIS — R030 Elevated blood-pressure reading, without diagnosis of hypertension: Secondary | ICD-10-CM | POA: Diagnosis not present

## 2021-05-14 DIAGNOSIS — E118 Type 2 diabetes mellitus with unspecified complications: Secondary | ICD-10-CM | POA: Diagnosis not present

## 2021-05-14 DIAGNOSIS — I110 Hypertensive heart disease with heart failure: Secondary | ICD-10-CM | POA: Diagnosis not present

## 2021-05-14 DIAGNOSIS — Z0000 Encounter for general adult medical examination without abnormal findings: Secondary | ICD-10-CM | POA: Diagnosis not present

## 2021-05-14 DIAGNOSIS — I501 Left ventricular failure: Secondary | ICD-10-CM | POA: Diagnosis not present

## 2021-05-14 DIAGNOSIS — F329 Major depressive disorder, single episode, unspecified: Secondary | ICD-10-CM | POA: Diagnosis not present

## 2021-05-16 ENCOUNTER — Other Ambulatory Visit: Payer: Self-pay | Admitting: Family Medicine

## 2021-05-16 DIAGNOSIS — F419 Anxiety disorder, unspecified: Secondary | ICD-10-CM

## 2021-05-16 DIAGNOSIS — F32A Depression, unspecified: Secondary | ICD-10-CM

## 2021-05-21 DIAGNOSIS — D132 Benign neoplasm of duodenum: Secondary | ICD-10-CM | POA: Diagnosis not present

## 2021-05-27 DIAGNOSIS — R1310 Dysphagia, unspecified: Secondary | ICD-10-CM | POA: Diagnosis not present

## 2021-05-27 DIAGNOSIS — D132 Benign neoplasm of duodenum: Secondary | ICD-10-CM | POA: Diagnosis not present

## 2021-05-27 DIAGNOSIS — K219 Gastro-esophageal reflux disease without esophagitis: Secondary | ICD-10-CM | POA: Diagnosis not present

## 2021-05-27 DIAGNOSIS — K209 Esophagitis, unspecified without bleeding: Secondary | ICD-10-CM | POA: Diagnosis not present

## 2021-06-04 DIAGNOSIS — M5416 Radiculopathy, lumbar region: Secondary | ICD-10-CM | POA: Diagnosis not present

## 2021-06-04 DIAGNOSIS — M4316 Spondylolisthesis, lumbar region: Secondary | ICD-10-CM | POA: Diagnosis not present

## 2021-06-24 ENCOUNTER — Other Ambulatory Visit: Payer: Self-pay | Admitting: Family Medicine

## 2021-06-24 DIAGNOSIS — F419 Anxiety disorder, unspecified: Secondary | ICD-10-CM

## 2021-07-02 ENCOUNTER — Ambulatory Visit: Payer: Federal, State, Local not specified - PPO | Admitting: Family Medicine

## 2021-07-02 ENCOUNTER — Encounter: Payer: Self-pay | Admitting: Family Medicine

## 2021-07-02 VITALS — BP 100/70 | HR 90 | Temp 98.2°F | Resp 16 | Wt 164.6 lb

## 2021-07-02 DIAGNOSIS — E890 Postprocedural hypothyroidism: Secondary | ICD-10-CM

## 2021-07-02 DIAGNOSIS — M79661 Pain in right lower leg: Secondary | ICD-10-CM | POA: Diagnosis not present

## 2021-07-02 DIAGNOSIS — Z23 Encounter for immunization: Secondary | ICD-10-CM | POA: Diagnosis not present

## 2021-07-02 LAB — TSH: TSH: 1.3 u[IU]/mL (ref 0.35–5.50)

## 2021-07-02 LAB — T3, FREE: T3, Free: 3.6 pg/mL (ref 2.3–4.2)

## 2021-07-02 LAB — T4, FREE: Free T4: 0.97 ng/dL (ref 0.60–1.60)

## 2021-07-02 NOTE — Assessment & Plan Note (Signed)
Chronic problem.  Pt currently follows w/ Endo but they are out of the office for awhile.  Reports worsening sxs of hypothyroid- cold intolerance, hair loss, fatigue, weight gain, constipation.  Check labs.  Adjust meds prn

## 2021-07-02 NOTE — Progress Notes (Signed)
° °  Subjective:    Patient ID: [name] [name], female    DOB: 1961/03/15, 60 y.o.   MRN: [id number]  HPI Hypothyroid- pt was previously on Levothyroxine 137mcg daily.  Currently on Levothyroxine 159mcg daily and following w/ Dr Mare Ferrari.  'i'm so tired.  I just want to sleep all the time'.  Reports 'i'm losing tons of hair'.  Feels cold 'all the time'.  + constipation.  Feels that she needs to have levels checked but Endo is out of the office.  R calf pain- over the 1.5 months.  Has chronic back pain that is worsening and she wonders if the leg pain can be related.  Pt denies redness, no TTP.  'it's getting hard'.  Taking Advil alternating w/ Tylenol.  Used a heating pad.   Review of Systems For ROS see HPI   This visit occurred during the SARS-CoV-2 public health emergency.  Safety protocols were in place, including screening questions prior to the visit, additional usage of staff PPE, and extensive cleaning of exam room while observing appropriate contact time as indicated for disinfecting solutions.      Objective:   Physical Exam Vitals reviewed.  Constitutional:      General: She is not in acute distress.    Appearance: Normal appearance. She is well-developed. She is not ill-appearing.  HENT:     Head: Normocephalic and atraumatic.  Eyes:     Conjunctiva/sclera: Conjunctivae normal.     Pupils: Pupils are equal, round, and reactive to light.  Neck:     Thyroid: No thyromegaly.  Cardiovascular:     Rate and Rhythm: Normal rate and regular rhythm.     Pulses: Normal pulses.     Heart sounds: Normal heart sounds. No murmur heard. Pulmonary:     Effort: Pulmonary effort is normal. No respiratory distress.     Breath sounds: Normal breath sounds.  Abdominal:     General: There is no distension.     Palpations: Abdomen is soft.     Tenderness: There is no abdominal tenderness.  Musculoskeletal:        General: No swelling or tenderness.     Cervical back: Normal range of  motion and neck supple.     Right lower leg: No edema.     Left lower leg: No edema.  Lymphadenopathy:     Cervical: No cervical adenopathy.  Skin:    General: Skin is warm and dry.     Findings: No erythema.  Neurological:     Mental Status: She is alert and oriented to person, place, and time.  Psychiatric:        Mood and Affect: Mood normal.        Behavior: Behavior normal.          Assessment & Plan:  R calf pain- new.  No redness, TTP, swelling, palpable cord.  No evidence of DVT.  Most likely related to her chronic back pain/radiculopathy.  Pt to discuss at upcoming appt w/ neurosurg.

## 2021-07-02 NOTE — Patient Instructions (Signed)
Follow up as needed or as scheduled We'll notify you of your lab results and make any changes if needed The calf pain is most likely coming from your back- it does NOT seem like a DVT Continue to alternate ice and heat- whichever feels better Call with any questions or concerns Stay Safe!  Stay Healthy! Happy New Year!!!

## 2021-07-03 ENCOUNTER — Encounter: Payer: Self-pay | Admitting: Family Medicine

## 2021-07-19 DIAGNOSIS — R030 Elevated blood-pressure reading, without diagnosis of hypertension: Secondary | ICD-10-CM | POA: Diagnosis not present

## 2021-07-19 DIAGNOSIS — Z6830 Body mass index (BMI) 30.0-30.9, adult: Secondary | ICD-10-CM | POA: Diagnosis not present

## 2021-07-19 DIAGNOSIS — M4316 Spondylolisthesis, lumbar region: Secondary | ICD-10-CM | POA: Diagnosis not present

## 2021-07-22 ENCOUNTER — Telehealth: Payer: Self-pay

## 2021-07-22 MED ORDER — BUPROPION HCL ER (XL) 150 MG PO TB24: 150.0000 mg | ORAL_TABLET | Freq: Every day | ORAL | 1 refills | Status: DC

## 2021-07-22 NOTE — Telephone Encounter (Signed)
Caller name:Tabatha Lavigne   On DPR? :Yes  Call back [phone]  Provider they see: [facility]   Reason for call:Pt is on buPROPion (WELLBUTRIN XL) 300 MG 24 hr tablet  pt is wanting to know if she can go down to 150mg  she feels that she is doing really well with the medication ?

## 2021-07-22 NOTE — Telephone Encounter (Signed)
Wellbutrin 300 mg last fill 07/04/21 30 tablets with 3 refills. Pt is now asking that the dose be changed from 300 mg to 150 mg as pt feels she is doing really well.

## 2021-07-22 NOTE — Telephone Encounter (Signed)
Called pt no answer left general message stating requested med has been called in and if she has questions she should contact us.

## 2021-07-22 NOTE — Telephone Encounter (Signed)
Prescription sent for the lower dose.

## 2021-07-23 ENCOUNTER — Other Ambulatory Visit: Payer: Self-pay | Admitting: Neurological Surgery

## 2021-07-25 DIAGNOSIS — M79604 Pain in right leg: Secondary | ICD-10-CM | POA: Diagnosis not present

## 2021-07-25 DIAGNOSIS — M48061 Spinal stenosis, lumbar region without neurogenic claudication: Secondary | ICD-10-CM | POA: Diagnosis not present

## 2021-07-25 DIAGNOSIS — M4316 Spondylolisthesis, lumbar region: Secondary | ICD-10-CM | POA: Diagnosis not present

## 2021-07-25 DIAGNOSIS — M545 Low back pain, unspecified: Secondary | ICD-10-CM | POA: Diagnosis not present

## 2021-08-01 NOTE — Progress Notes (Signed)
Surgical Instructions    Your procedure is scheduled on January 31,2023.  Report to [facility] Main Entrance "A" at 1022 A.M., then check in with the Admitting office.  Call this number if you have problems the morning of surgery:  [phone]   If you have any questions prior to your surgery date call [phone]: Open Monday-Friday 8am-4pm    Remember:  Do not eat after midnight the night before your surgery  You may drink clear liquids until 0922 the morning of your surgery.   Clear liquids allowed are: Water, Non-Citrus Juices (without pulp), Carbonated Beverages, Clear Tea, Black Coffee ONLY (NO MILK, CREAM OR POWDERED CREAMER of any kind), and Gatorade    Take these medicines the morning of surgery with A SIP OF WATER:             ALPRAZolam (XANAX)   if needed            buPROPion (WELLBUTRIN XL)            famotidine (PEPCID)            levothyroxine (SYNTHROID)            traMADol (ULTRAM)            valACYclovir (VALTREX) if needed   As of today, STOP taking any Aspirin (unless otherwise instructed by your surgeon) Aleve, Naproxen, Ibuprofen, Motrin, Advil, Goody's, BC's, all herbal medications, fish oil, and all vitamins.  After your COVID test   You are not required to quarantine however you are required to wear a well-fitting mask when you are out and around people not in your household.  If your mask becomes wet or soiled, replace with a new one.  Wash your hands often with soap and water for 20 seconds or clean your hands with an alcohol-based hand sanitizer that contains at least 60% alcohol.  Do not share personal items.  Notify your provider: if you are in close contact with someone who has COVID  or if you develop a fever of 100.4 or greater, sneezing, cough, sore throat, shortness of breath or body aches.           Do not wear jewelry or makeup Do not wear lotions, powders, perfumes/colognes, or deodorant. Do not shave 48 hours prior to surgery.  Men  may shave face and neck. Do not bring valuables to the hospital. DO Not wear nail polish, gel polish, artificial nails, or any other type of covering on natural nails (fingers and toes) If you have artificial nails or gel coating that need to be removed by a nail salon, please have this removed prior to surgery. Artificial nails or gel coating may interfere with anesthesia's ability to adequately monitor your vital signs.             [facility] is not responsible for any belongings or valuables.  Do NOT Smoke (Tobacco/Vaping)  24 hours prior to your procedure  If you use a CPAP at night, you may bring your mask for your overnight stay.   Contacts, glasses, hearing aids, dentures or partials may not be worn into surgery, please bring cases for these belongings   For patients admitted to the hospital, discharge time will be determined by your treatment team.   Patients discharged the day of surgery will not be allowed to drive home, and someone needs to stay with them for 24 hours.  NO VISITORS WILL BE ALLOWED IN PRE-OP WHERE PATIENTS ARE PREPPED FOR SURGERY.  ONLY 1 SUPPORT PERSON MAY BE PRESENT IN THE WAITING ROOM WHILE YOU ARE IN SURGERY.  IF YOU ARE TO BE ADMITTED, ONCE YOU ARE IN YOUR ROOM YOU WILL BE ALLOWED TWO (2) VISITORS. 1 (ONE) VISITOR MAY STAY OVERNIGHT BUT MUST ARRIVE TO THE ROOM BY 8pm.  Minor children may have two parents present. Special consideration for safety and communication needs will be reviewed on a case by case basis.  Special instructions:    Oral Hygiene is also important to reduce your risk of infection.  Remember - BRUSH YOUR TEETH THE MORNING OF SURGERY WITH YOUR REGULAR TOOTHPASTE   [facility]- Preparing For Surgery  Before surgery, you can play an important role. Because skin is not sterile, your skin needs to be as free of germs as possible. You can reduce the number of germs on your skin by washing with CHG (chlorahexidine gluconate) Soap before surgery.   CHG is an antiseptic cleaner which kills germs and bonds with the skin to continue killing germs even after washing.     Please do not use if you have an allergy to CHG or antibacterial soaps. If your skin becomes reddened/irritated stop using the CHG.  Do not shave (including legs and underarms) for at least 48 hours prior to first CHG shower. It is OK to shave your face.  Please follow these instructions carefully.     Shower the NIGHT BEFORE SURGERY and the MORNING OF SURGERY with CHG Soap.   If you chose to wash your hair, wash your hair first as usual with your normal shampoo. After you shampoo, rinse your hair and body thoroughly to remove the shampoo.  Then [employer] and genitals (private parts) with your normal soap and rinse thoroughly to remove soap.  After that Use CHG Soap as you would any other liquid soap. You can apply CHG directly to the skin and wash gently with a scrungie or a clean washcloth.   Apply the CHG Soap to your body ONLY FROM THE NECK DOWN.  Do not use on open wounds or open sores. Avoid contact with your eyes, ears, mouth and genitals (private parts). Wash Face and genitals (private parts)  with your normal soap.   Wash thoroughly, paying special attention to the area where your surgery will be performed.  Thoroughly rinse your body with warm water from the neck down.  DO NOT shower/wash with your normal soap after using and rinsing off the CHG Soap.  Pat yourself dry with a CLEAN TOWEL.  Wear CLEAN PAJAMAS to bed the night before surgery  Place CLEAN SHEETS on your bed the night before your surgery  DO NOT SLEEP WITH PETS.   Day of Surgery:  Take a shower with CHG soap. Wear Clean/Comfortable clothing the morning of surgery Do not apply any deodorants/lotions.   Remember to brush your teeth WITH YOUR REGULAR TOOTHPASTE.   Please read over the following fact sheets that you were given.

## 2021-08-02 ENCOUNTER — Encounter (HOSPITAL_COMMUNITY): Payer: Self-pay

## 2021-08-02 ENCOUNTER — Other Ambulatory Visit: Payer: Self-pay

## 2021-08-02 ENCOUNTER — Encounter (HOSPITAL_COMMUNITY)
Admission: RE | Admit: 2021-08-02 | Discharge: 2021-08-02 | Disposition: A | Discharge status: Home / Self Care | Payer: Federal, State, Local not specified - PPO | Source: Ambulatory Visit | Attending: Neurological Surgery | Admitting: Neurological Surgery

## 2021-08-02 VITALS — BP 128/72 | HR 94 | Temp 97.8°F | Resp 17 | Ht 62.0 in | Wt 162.0 lb

## 2021-08-02 DIAGNOSIS — Z01818 Encounter for other preprocedural examination: Secondary | ICD-10-CM

## 2021-08-02 DIAGNOSIS — Z20822 Contact with and (suspected) exposure to covid-19: Secondary | ICD-10-CM | POA: Diagnosis not present

## 2021-08-02 DIAGNOSIS — Z01812 Encounter for preprocedural laboratory examination: Secondary | ICD-10-CM | POA: Diagnosis not present

## 2021-08-02 LAB — TYPE AND SCREEN
ABO/RH(D): AB POS
Antibody Screen: NEGATIVE

## 2021-08-02 LAB — CBC
HCT: 41.3 % (ref 36.0–46.0)
Hemoglobin: 13.6 g/dL (ref 12.0–15.0)
MCH: 29.9 pg (ref 26.0–34.0)
MCHC: 32.9 g/dL (ref 30.0–36.0)
MCV: 90.8 fL (ref 80.0–100.0)
Platelets: 262 10*3/uL (ref 150–400)
RBC: 4.55 MIL/uL (ref 3.87–5.11)
RDW: 12.7 % (ref 11.5–15.5)
WBC: 8.7 10*3/uL (ref 4.0–10.5)
nRBC: 0 % (ref 0.0–0.2)

## 2021-08-02 LAB — SURGICAL PCR SCREEN
MRSA, PCR: NEGATIVE
Staphylococcus aureus: NEGATIVE

## 2021-08-02 LAB — SARS CORONAVIRUS 2 (TAT 6-24 HRS): SARS Coronavirus 2: NEGATIVE

## 2021-08-02 NOTE — Progress Notes (Signed)
PCP - Aundra Millet. Tabori,MD Cardiologist - denies  PPM/ICD - denies Device Orders -  Rep Notified -   Chest x-ray - 01/07/20 EKG - 04/19/19 Stress Test - no ECHO - no Cardiac Cath -no   Sleep Study - none CPAP - none  Fasting Blood Sugar - n/a Checks Blood Sugar _____ times a day  Blood Thinner Instructions:n/a Aspirin Instructions:n/a  ERAS Protcol -clear liquids until 0922 PRE-SURGERY Ensure or G2- no  COVID TEST- 08/02/21   Anesthesia review: no  Patient denies shortness of breath, fever, cough and chest pain at PAT appointment   All instructions explained to the patient, with a verbal understanding of the material. Patient agrees to go over the instructions while at home for a better understanding. Patient also instructed to self quarantine after being tested for COVID-19. The opportunity to ask questions was provided.

## 2021-08-06 ENCOUNTER — Ambulatory Visit (HOSPITAL_COMMUNITY): Payer: Federal, State, Local not specified - PPO | Admitting: Anesthesiology

## 2021-08-06 ENCOUNTER — Ambulatory Visit (HOSPITAL_COMMUNITY): Payer: Federal, State, Local not specified - PPO

## 2021-08-06 ENCOUNTER — Ambulatory Visit (HOSPITAL_COMMUNITY): Payer: Federal, State, Local not specified - PPO | Admitting: Vascular Surgery

## 2021-08-06 ENCOUNTER — Encounter (HOSPITAL_COMMUNITY): Payer: Self-pay | Admitting: Neurological Surgery

## 2021-08-06 ENCOUNTER — Other Ambulatory Visit: Payer: Self-pay

## 2021-08-06 ENCOUNTER — Observation Stay (HOSPITAL_COMMUNITY)
Admission: AD | Admit: 2021-08-06 | Discharge: 2021-08-07 | Disposition: A | Discharge status: Home / Self Care | Payer: Federal, State, Local not specified - PPO | Attending: Neurological Surgery | Admitting: Neurological Surgery

## 2021-08-06 ENCOUNTER — Encounter (HOSPITAL_COMMUNITY)
Admission: AD | Disposition: A | Discharge status: Home / Self Care | Payer: Self-pay | Source: Home / Self Care | Attending: Neurological Surgery

## 2021-08-06 DIAGNOSIS — M4316 Spondylolisthesis, lumbar region: Principal | ICD-10-CM | POA: Diagnosis present

## 2021-08-06 DIAGNOSIS — Z87891 Personal history of nicotine dependence: Secondary | ICD-10-CM | POA: Insufficient documentation

## 2021-08-06 DIAGNOSIS — Z853 Personal history of malignant neoplasm of breast: Secondary | ICD-10-CM | POA: Insufficient documentation

## 2021-08-06 DIAGNOSIS — M4326 Fusion of spine, lumbar region: Secondary | ICD-10-CM | POA: Diagnosis not present

## 2021-08-06 DIAGNOSIS — Z419 Encounter for procedure for purposes other than remedying health state, unspecified: Secondary | ICD-10-CM

## 2021-08-06 DIAGNOSIS — J45909 Unspecified asthma, uncomplicated: Secondary | ICD-10-CM | POA: Diagnosis not present

## 2021-08-06 DIAGNOSIS — E039 Hypothyroidism, unspecified: Secondary | ICD-10-CM | POA: Diagnosis not present

## 2021-08-06 HISTORY — PX: TRANSFORAMINAL LUMBAR INTERBODY FUSION W/ MIS 1 LEVEL: SHX6145

## 2021-08-06 LAB — ABO/RH: ABO/RH(D): AB POS

## 2021-08-06 SURGERY — MINIMALLY INVASIVE (MIS) TRANSFORAMINAL LUMBAR INTERBODY FUSION (TLIF) 1 LEVEL
Anesthesia: General | Site: Spine Lumbar | Laterality: Left

## 2021-08-06 MED ORDER — CYCLOBENZAPRINE HCL 10 MG PO TABS
10.0000 mg | ORAL_TABLET | Freq: Three times a day (TID) | ORAL | Status: DC | PRN
  Administered 2021-08-06 – 2021-08-07 (×2): 10 mg via ORAL
  Filled 2021-08-06 (×2): qty 1

## 2021-08-06 MED ORDER — SODIUM CHLORIDE 0.9% FLUSH
3.0000 mL | Freq: Two times a day (BID) | INTRAVENOUS | Status: DC
  Administered 2021-08-06: 3 mL via INTRAVENOUS

## 2021-08-06 MED ORDER — PROPOFOL 10 MG/ML IV BOLUS
INTRAVENOUS | Status: DC | PRN
  Administered 2021-08-06: 150 mg via INTRAVENOUS

## 2021-08-06 MED ORDER — KETAMINE HCL 50 MG/5ML IJ SOSY
PREFILLED_SYRINGE | INTRAMUSCULAR | Status: AC
  Filled 2021-08-06: qty 5

## 2021-08-06 MED ORDER — PHENYLEPHRINE 40 MCG/ML (10ML) SYRINGE FOR IV PUSH (FOR BLOOD PRESSURE SUPPORT)
PREFILLED_SYRINGE | INTRAVENOUS | Status: DC | PRN
  Administered 2021-08-06: 120 ug via INTRAVENOUS

## 2021-08-06 MED ORDER — MIDAZOLAM HCL 2 MG/2ML IJ SOLN
INTRAMUSCULAR | Status: DC | PRN
  Administered 2021-08-06: 2 mg via INTRAVENOUS

## 2021-08-06 MED ORDER — AMISULPRIDE (ANTIEMETIC) 5 MG/2ML IV SOLN: 10.0000 mg | Freq: Once | INTRAVENOUS | Status: DC | PRN

## 2021-08-06 MED ORDER — ONDANSETRON HCL 4 MG PO TABS: 4.0000 mg | ORAL_TABLET | Freq: Four times a day (QID) | ORAL | Status: DC | PRN

## 2021-08-06 MED ORDER — THROMBIN 5000 UNITS EX SOLR
CUTANEOUS | Status: AC
  Filled 2021-08-06: qty 5000

## 2021-08-06 MED ORDER — CHLORHEXIDINE GLUCONATE CLOTH 2 % EX PADS: 6.0000 | MEDICATED_PAD | Freq: Once | CUTANEOUS | Status: DC

## 2021-08-06 MED ORDER — LEVOTHYROXINE SODIUM 100 MCG PO TABS
100.0000 ug | ORAL_TABLET | Freq: Every day | ORAL | Status: DC
  Administered 2021-08-07: 100 ug via ORAL
  Filled 2021-08-06: qty 1

## 2021-08-06 MED ORDER — ALBUMIN HUMAN 5 % IV SOLN: INTRAVENOUS | Status: DC | PRN

## 2021-08-06 MED ORDER — DOCUSATE SODIUM 100 MG PO CAPS
100.0000 mg | ORAL_CAPSULE | Freq: Two times a day (BID) | ORAL | Status: DC
  Administered 2021-08-06 – 2021-08-07 (×2): 100 mg via ORAL
  Filled 2021-08-06 (×2): qty 1

## 2021-08-06 MED ORDER — MENTHOL 3 MG MT LOZG: 1.0000 | LOZENGE | OROMUCOSAL | Status: DC | PRN

## 2021-08-06 MED ORDER — BUPROPION HCL ER (XL) 150 MG PO TB24
150.0000 mg | ORAL_TABLET | Freq: Every evening | ORAL | Status: DC
  Filled 2021-08-06 (×3): qty 1

## 2021-08-06 MED ORDER — 0.9 % SODIUM CHLORIDE (POUR BTL) OPTIME
TOPICAL | Status: DC | PRN
  Administered 2021-08-06: 1000 mL

## 2021-08-06 MED ORDER — CEFAZOLIN SODIUM-DEXTROSE 2-4 GM/100ML-% IV SOLN
2.0000 g | Freq: Three times a day (TID) | INTRAVENOUS | Status: AC
  Administered 2021-08-06 – 2021-08-07 (×2): 2 g via INTRAVENOUS
  Filled 2021-08-06 (×2): qty 100

## 2021-08-06 MED ORDER — OXYCODONE HCL 5 MG PO TABS
10.0000 mg | ORAL_TABLET | ORAL | Status: DC | PRN
  Administered 2021-08-06 – 2021-08-07 (×4): 10 mg via ORAL
  Filled 2021-08-06 (×4): qty 2

## 2021-08-06 MED ORDER — POLYETHYLENE GLYCOL 3350 17 G PO PACK: 17.0000 g | PACK | Freq: Every day | ORAL | Status: DC | PRN

## 2021-08-06 MED ORDER — SCOPOLAMINE 1 MG/3DAYS TD PT72
1.0000 | MEDICATED_PATCH | TRANSDERMAL | Status: DC
  Administered 2021-08-06: 1.5 mg via TRANSDERMAL
  Filled 2021-08-06: qty 1

## 2021-08-06 MED ORDER — PHENYLEPHRINE HCL-NACL 20-0.9 MG/250ML-% IV SOLN
INTRAVENOUS | Status: DC | PRN
  Administered 2021-08-06: 75 ug/min via INTRAVENOUS

## 2021-08-06 MED ORDER — ACETAMINOPHEN 500 MG PO TABS
1000.0000 mg | ORAL_TABLET | Freq: Once | ORAL | Status: AC
Start: 2021-08-06 — End: 2021-08-06
  Administered 2021-08-06: 1000 mg via ORAL
  Filled 2021-08-06: qty 2

## 2021-08-06 MED ORDER — DEXAMETHASONE SODIUM PHOSPHATE 10 MG/ML IJ SOLN
INTRAMUSCULAR | Status: DC | PRN
  Administered 2021-08-06: 10 mg via INTRAVENOUS

## 2021-08-06 MED ORDER — ROCURONIUM BROMIDE 10 MG/ML (PF) SYRINGE
PREFILLED_SYRINGE | INTRAVENOUS | Status: DC | PRN
  Administered 2021-08-06: 30 mg via INTRAVENOUS
  Administered 2021-08-06: 20 mg via INTRAVENOUS
  Administered 2021-08-06: 80 mg via INTRAVENOUS

## 2021-08-06 MED ORDER — FENTANYL CITRATE (PF) 250 MCG/5ML IJ SOLN
INTRAMUSCULAR | Status: AC
  Filled 2021-08-06: qty 5

## 2021-08-06 MED ORDER — LIDOCAINE 2% (20 MG/ML) 5 ML SYRINGE
INTRAMUSCULAR | Status: DC | PRN
  Administered 2021-08-06: 100 mg via INTRAVENOUS

## 2021-08-06 MED ORDER — ONDANSETRON HCL 4 MG/2ML IJ SOLN: 4.0000 mg | Freq: Four times a day (QID) | INTRAMUSCULAR | Status: DC | PRN

## 2021-08-06 MED ORDER — FENTANYL CITRATE (PF) 100 MCG/2ML IJ SOLN
INTRAMUSCULAR | Status: AC
  Filled 2021-08-06: qty 2

## 2021-08-06 MED ORDER — THROMBIN 5000 UNITS EX SOLR
OROMUCOSAL | Status: DC | PRN
  Administered 2021-08-06: 5 mL via TOPICAL

## 2021-08-06 MED ORDER — FENTANYL CITRATE (PF) 250 MCG/5ML IJ SOLN
INTRAMUSCULAR | Status: DC | PRN
  Administered 2021-08-06: 100 ug via INTRAVENOUS
  Administered 2021-08-06 (×2): 50 ug via INTRAVENOUS

## 2021-08-06 MED ORDER — LACTATED RINGERS IV SOLN: INTRAVENOUS | Status: DC

## 2021-08-06 MED ORDER — SODIUM CHLORIDE 0.9% FLUSH: 3.0000 mL | INTRAVENOUS | Status: DC | PRN

## 2021-08-06 MED ORDER — SODIUM CHLORIDE 0.9 % IV SOLN: 250.0000 mL | INTRAVENOUS | Status: DC

## 2021-08-06 MED ORDER — MIDAZOLAM HCL 2 MG/2ML IJ SOLN
INTRAMUSCULAR | Status: AC
  Filled 2021-08-06: qty 2

## 2021-08-06 MED ORDER — ACETAMINOPHEN 325 MG PO TABS
650.0000 mg | ORAL_TABLET | ORAL | Status: DC | PRN
  Administered 2021-08-07 (×3): 650 mg via ORAL
  Filled 2021-08-06 (×3): qty 2

## 2021-08-06 MED ORDER — ACETAMINOPHEN 650 MG RE SUPP: 650.0000 mg | RECTAL | Status: DC | PRN

## 2021-08-06 MED ORDER — SUGAMMADEX SODIUM 200 MG/2ML IV SOLN
INTRAVENOUS | Status: DC | PRN
  Administered 2021-08-06: 400 mg via INTRAVENOUS

## 2021-08-06 MED ORDER — KETAMINE HCL 10 MG/ML IJ SOLN
INTRAMUSCULAR | Status: DC | PRN
Start: 2021-08-06 — End: 2021-08-06
  Administered 2021-08-06: 10 mg via INTRAVENOUS
  Administered 2021-08-06: 30 mg via INTRAVENOUS
  Administered 2021-08-06: 10 mg via INTRAVENOUS

## 2021-08-06 MED ORDER — ORAL CARE MOUTH RINSE: 15.0000 mL | Freq: Once | OROMUCOSAL | Status: AC

## 2021-08-06 MED ORDER — FENTANYL CITRATE (PF) 100 MCG/2ML IJ SOLN
25.0000 ug | INTRAMUSCULAR | Status: AC | PRN
  Administered 2021-08-06 (×6): 25 ug via INTRAVENOUS

## 2021-08-06 MED ORDER — CEFAZOLIN SODIUM-DEXTROSE 2-4 GM/100ML-% IV SOLN
2.0000 g | INTRAVENOUS | Status: AC
  Administered 2021-08-06: 2 g via INTRAVENOUS

## 2021-08-06 MED ORDER — NALOXONE HCL 0.4 MG/ML IJ SOLN
INTRAMUSCULAR | Status: AC
  Filled 2021-08-06: qty 1

## 2021-08-06 MED ORDER — CHLORHEXIDINE GLUCONATE 0.12 % MT SOLN
15.0000 mL | Freq: Once | OROMUCOSAL | Status: AC
  Administered 2021-08-06: 15 mL via OROMUCOSAL
  Filled 2021-08-06: qty 15

## 2021-08-06 MED ORDER — PROMETHAZINE HCL 25 MG/ML IJ SOLN: 6.2500 mg | INTRAMUSCULAR | Status: DC | PRN

## 2021-08-06 MED ORDER — MORPHINE SULFATE (PF) 4 MG/ML IV SOLN
4.0000 mg | INTRAVENOUS | Status: DC | PRN
  Administered 2021-08-06 (×2): 4 mg via INTRAVENOUS
  Filled 2021-08-06 (×2): qty 1

## 2021-08-06 MED ORDER — LIDOCAINE-EPINEPHRINE 1 %-1:100000 IJ SOLN
INTRAMUSCULAR | Status: DC | PRN
Start: 2021-08-06 — End: 2021-08-06
  Administered 2021-08-06: 10 mL

## 2021-08-06 MED ORDER — PHENOL 1.4 % MT LIQD
1.0000 | OROMUCOSAL | Status: DC | PRN
Start: 2021-08-06 — End: 2021-08-07

## 2021-08-06 MED ORDER — OXYCODONE HCL 5 MG PO TABS: 5.0000 mg | ORAL_TABLET | ORAL | Status: DC | PRN

## 2021-08-06 MED ORDER — CEFAZOLIN SODIUM-DEXTROSE 2-4 GM/100ML-% IV SOLN
INTRAVENOUS | Status: AC
  Filled 2021-08-06: qty 100

## 2021-08-06 MED ORDER — FAMOTIDINE 20 MG PO TABS
20.0000 mg | ORAL_TABLET | Freq: Every morning | ORAL | Status: DC
  Administered 2021-08-07: 20 mg via ORAL
  Filled 2021-08-06: qty 1

## 2021-08-06 MED ORDER — ONDANSETRON HCL 4 MG/2ML IJ SOLN
INTRAMUSCULAR | Status: DC | PRN
  Administered 2021-08-06: 4 mg via INTRAVENOUS

## 2021-08-06 MED ORDER — LIDOCAINE-EPINEPHRINE 1 %-1:100000 IJ SOLN
INTRAMUSCULAR | Status: AC
  Filled 2021-08-06: qty 1

## 2021-08-06 MED ORDER — LORATADINE 10 MG PO TABS
10.0000 mg | ORAL_TABLET | Freq: Every day | ORAL | Status: DC
  Administered 2021-08-07: 10 mg via ORAL
  Filled 2021-08-06 (×2): qty 1

## 2021-08-06 MED ORDER — ALPRAZOLAM 0.5 MG PO TABS
0.5000 mg | ORAL_TABLET | Freq: Two times a day (BID) | ORAL | Status: DC | PRN
  Administered 2021-08-06: 0.5 mg via ORAL
  Filled 2021-08-06: qty 1

## 2021-08-06 SURGICAL SUPPLY — 64 items
ADH SKN CLS APL DERMABOND .7 (GAUZE/BANDAGES/DRESSINGS) ×1
BAG COUNTER SPONGE SURGICOUNT (BAG) ×2 IMPLANT
BAG SPNG CNTER NS LX DISP (BAG) ×1
BAND INSRT 18 STRL LF DISP RB (MISCELLANEOUS) ×2
BAND RUBBER #18 3X1/16 STRL (MISCELLANEOUS) ×4 IMPLANT
BASKET BONE COLLECTION (BASKET) ×2 IMPLANT
BLADE CLIPPER SURG (BLADE) IMPLANT
BLADE SURG 11 STRL SS (BLADE) ×2 IMPLANT
BUR MATCHSTICK NEURO 3.0 LAGG (BURR) IMPLANT
BUR PRECISION MATCH 3.0 13 (BURR) IMPLANT
BUR ROUND FLUTED 4 SOFT TCH (BURR) ×3 IMPLANT
CAGE EXP CATALYFT 9 (Plate) ×1 IMPLANT
CNTNR URN SCR LID CUP LEK RST (MISCELLANEOUS) ×1 IMPLANT
CONT SPEC 4OZ STRL OR WHT (MISCELLANEOUS) ×2
COVER BACK TABLE 60X90IN (DRAPES) ×2 IMPLANT
DECANTER SPIKE VIAL GLASS SM (MISCELLANEOUS) ×2 IMPLANT
DERMABOND ADVANCED (GAUZE/BANDAGES/DRESSINGS) ×1
DERMABOND ADVANCED .7 DNX12 (GAUZE/BANDAGES/DRESSINGS) ×1 IMPLANT
DRAPE C-ARM 42X72 X-RAY (DRAPES) ×2 IMPLANT
DRAPE C-ARMOR (DRAPES) ×2 IMPLANT
DRAPE LAPAROTOMY 100X72X124 (DRAPES) ×2 IMPLANT
DRAPE MICROSCOPE LEICA (MISCELLANEOUS) ×2 IMPLANT
DRAPE SURG 17X23 STRL (DRAPES) ×2 IMPLANT
ELECT BLADE 6.5 EXT (BLADE) ×2 IMPLANT
ELECT REM PT RETURN 9FT ADLT (ELECTROSURGICAL) ×2
ELECTRODE REM PT RTRN 9FT ADLT (ELECTROSURGICAL) ×1 IMPLANT
EXTENDER TAB GUIDE SV 5.5/6.0 (INSTRUMENTS) ×8 IMPLANT
GAUZE 4X4 16PLY ~~LOC~~+RFID DBL (SPONGE) ×1 IMPLANT
GAUZE SPONGE 4X4 12PLY STRL (GAUZE/BANDAGES/DRESSINGS) ×1 IMPLANT
GLOVE SURG LTX SZ7.5 (GLOVE) ×3 IMPLANT
GLOVE SURG UNDER POLY LF SZ7 (GLOVE) ×4 IMPLANT
GLOVE SURG UNDER POLY LF SZ7.5 (GLOVE) ×3 IMPLANT
GOWN STRL REUS W/ TWL LRG LVL3 (GOWN DISPOSABLE) ×1 IMPLANT
GOWN STRL REUS W/ TWL XL LVL3 (GOWN DISPOSABLE) IMPLANT
GOWN STRL REUS W/TWL 2XL LVL3 (GOWN DISPOSABLE) IMPLANT
GOWN STRL REUS W/TWL LRG LVL3 (GOWN DISPOSABLE) ×4
GOWN STRL REUS W/TWL XL LVL3 (GOWN DISPOSABLE) ×4
GUIDEWIRE BLUNT NT 450 (WIRE) ×4 IMPLANT
HEMOSTAT POWDER KIT SURGIFOAM (HEMOSTASIS) ×2 IMPLANT
KIT BASIN OR (CUSTOM PROCEDURE TRAY) ×2 IMPLANT
KIT POSITION SURG JACKSON T1 (MISCELLANEOUS) ×2 IMPLANT
KIT TURNOVER KIT B (KITS) ×1 IMPLANT
NDL BEVEL TWO-PAK W/1PK (NEEDLE) IMPLANT
NDL HYPO 18GX1.5 BLUNT FILL (NEEDLE) IMPLANT
NDL SPNL 18GX3.5 QUINCKE PK (NEEDLE) IMPLANT
NEEDLE BEVEL TWO-PAK W/1PK (NEEDLE) ×2 IMPLANT
NEEDLE HYPO 18GX1.5 BLUNT FILL (NEEDLE) IMPLANT
NEEDLE HYPO 22GX1.5 SAFETY (NEEDLE) ×2 IMPLANT
NEEDLE SPNL 18GX3.5 QUINCKE PK (NEEDLE) IMPLANT
NS IRRIG 1000ML POUR BTL (IV SOLUTION) ×2 IMPLANT
PACK LAMINECTOMY NEURO (CUSTOM PROCEDURE TRAY) ×2 IMPLANT
PAD ARMBOARD 7.5X6 YLW CONV (MISCELLANEOUS) ×4 IMPLANT
ROD PERC CCM 5.5X35 (Rod) ×2 IMPLANT
SCREW MAS VOYAGER 6.5X40 (Screw) ×4 IMPLANT
SPONGE T-LAP 4X18 ~~LOC~~+RFID (SPONGE) ×1 IMPLANT
SUT MNCRL AB 3-0 PS2 18 (SUTURE) ×2 IMPLANT
SUT VIC AB 0 CT1 18XCR BRD8 (SUTURE) IMPLANT
SUT VIC AB 0 CT1 8-18 (SUTURE)
SUT VIC AB 2-0 CP2 18 (SUTURE) ×2 IMPLANT
SYR 3ML LL SCALE MARK (SYRINGE) IMPLANT
TOWEL GREEN STERILE (TOWEL DISPOSABLE) ×2 IMPLANT
TOWEL GREEN STERILE FF (TOWEL DISPOSABLE) ×2 IMPLANT
TRAY FOLEY MTR SLVR 16FR STAT (SET/KITS/TRAYS/PACK) ×1 IMPLANT
WATER STERILE IRR 1000ML POUR (IV SOLUTION) ×2 IMPLANT

## 2021-08-06 NOTE — Transfer of Care (Signed)
Immediate Anesthesia Transfer of Care Note  Patient: [name]  Procedure(s) Performed: Lumbar four-five Minimally Invasive Transforaminal Lumbar Interbody Fusion with Metrex (Left: Spine Lumbar)  Patient Location: PACU  Anesthesia Type:General  Level of Consciousness: drowsy and patient cooperative  Airway & Oxygen Therapy: Patient Spontanous Breathing and Patient connected to face mask oxygen  Post-op Assessment: Report given to RN and Post -op Vital signs reviewed and stable  Post vital signs: Reviewed and stable  Last Vitals:  Vitals Value Taken Time  BP 104/77 08/06/21 1531  Temp 36.5 C 08/06/21 1530  Pulse 111 08/06/21 1539  Resp 29 08/06/21 1539  SpO2 100 % 08/06/21 1539  Vitals shown include unvalidated device data.  Last Pain:  Vitals:   08/06/21 1530  PainSc: Asleep      Patients Stated Pain Goal: 0 (67/89/38 1017)  Complications: No notable events documented.

## 2021-08-06 NOTE — Op Note (Signed)
PATIENT: [name] [name]  DAY OF SURGERY: 08/06/21   PRE-OPERATIVE DIAGNOSIS:  Lumbar spondylolisthesis   POST-OPERATIVE DIAGNOSIS:  Same   PROCEDURE:  L4-L5 minimally invasive transforaminal lumbar interbody fusion with bilateral L4-L5 pedicle screw placement   SURGEON:  Surgeon(s) and Role:    Judith Part, MD - Primary    Consuella Lose, MD - Assisting   ANESTHESIA: ETGA   BRIEF HISTORY: This is a 61 year old man who presented with progressive back and BLE pain. Workup showed a mobile spondylolisthesis at L4-5. Her symptoms were unfortunately quite severe and not durably responsive to non-surgical treatment measures. I therefore recommended an MIS TLIF at that level. This was discussed with the patient as well as risks, benefits, and alternatives and wished to proceed with surgery.   OPERATIVE DETAIL:  The patient was taken to the operating room and placed on the OR table in the prone position. A formal time out was performed with two patient identifiers and confirmed the operative site. Anesthesia was induced by the anesthesia team. The operative site was marked, hair was clipped with surgical clippers, the area was then prepped and draped in a sterile fashion.   Fluoroscopy was used to localize the surgical level. The pedicles were marked and used to create skin incisions bilaterally. With fluoro guidance, Jamshidi needles were used to guide K-wires into the bilateral L4 and L5 pedicles. The K wires were then secured with hemostats and attention turned to the TLIF.  A MetRx tube was then docked to the left L4-5 facet through the same incision using fluoroscopy. Of note, the joint was quite loose before dissection and I pulled out a large portion of it with just a pituitary rongeur before doing any ligament / bony resection. A left L4-5 facetectomy was then performed and the left L4 nerve root was decompressed along its entire course. The disc space was identified, incised, and  a discectomy was performed in the standard fashion. The endplates were prepped, bone graft was packed into the disc space, and an expandable cage ([employer]) was packed with autograft and placed into the disc space with fluoroscopic confirmation. The tube was removed and hemostasis was obtained during its removal.   Using the previously placed K wires, a tap and then screw with tower were placed bilaterally at L4 and L5. A rod was sized and introduced on both sides, confirmed with fluoroscopy, then final tightened. Hemostasis was again confirmed for both incisions, they were copiously irrigated, and then closed in layers.    EBL:  32mL   DRAINS: none   SPECIMENS: none   Judith Part, MD 08/06/21 3:11 PM

## 2021-08-06 NOTE — Anesthesia Procedure Notes (Signed)
Procedure Name: Intubation Date/Time: 08/06/2021 12:53 PM Performed by: Griffin Dakin, CRNA Pre-anesthesia Checklist: Patient identified, Emergency Drugs available, Suction available and Patient being monitored Patient Re-evaluated:Patient Re-evaluated prior to induction Oxygen Delivery Method: Circle system utilized Preoxygenation: Pre-oxygenation with 100% oxygen Induction Type: IV induction Ventilation: Mask ventilation without difficulty Laryngoscope Size: Mac and 3 Grade View: Grade I Tube type: Oral Tube size: 7.5 mm Number of attempts: 1 Airway Equipment and Method: Stylet and Oral airway Placement Confirmation: ETT inserted through vocal cords under direct vision, positive ETCO2 and breath sounds checked- equal and bilateral Secured at: 23 cm Tube secured with: Tape Dental Injury: Teeth and Oropharynx as per pre-operative assessment

## 2021-08-06 NOTE — H&P (Signed)
Surgical H&P Update  HPI: 61 y.o. with a history of low back and bilateral lower extremity radicular pain. Workup showed a mobile spondylolisthesis at L4-5. Her symptoms were not durably responsive to non-surgical treatment measures, I therefore recommended an MIS TLIF for stabilization and decompression. No changes in health since they were last seen. Still having the above and wishes to proceed with surgery.  PMHx:  Past Medical History:  Diagnosis Date   Allergy    Anemia    Anxiety    takes Xanax prn   Arthritis    Asthma    dx yrs ago but no problems in > 15 yrs   Back pain    pinched nerve   Breast cancer ([place]) 2013   left breast   Chronic constipation    Colon polyps    Common migraine with intractable migraine 02/12/2016   Depression    takes Wellbutrin   Diverticulitis    Dysphagia    Esophageal polyp    Genetic testing 11/03/2016   Ms. Blackwelder underwent genetic counseling and testing for hereditary cancer syndromes on 10/23/2016. Her results were negative for mutations in all 46 genes analyzed by [facility]'s 46-gene Common Hereditary Cancers Panel. Genes analyzed include: APC, ATM, AXIN2, BARD1, BMPR1A, BRCA1, BRCA2, BRIP1, CDH1, CDKN2A, CHEK2, CTNNA1, DICER1, EPCAM, GREM1, HOXB13, KIT, MEN1, MLH1, MSH2, MSH3, MSH6, MUTYH, NB   GERD (gastroesophageal reflux disease)    Hiatal hernia    History of blood transfusion    no abnormal reaction noted   History of shingles    Hypothyroidism    Graves Disease;takes Synthroid daily   Inflammatory bowel disease (ulcerative colitis) ([place])    Multiple allergies    takes Zyrtec nightly   Wears glasses    FamHx:  Family History  Problem Relation Age of Onset   Heart disease Mother    Diabetes Mother    Hyperlipidemia Mother    Stroke Mother    Cirrhosis Mother    Hepatitis C Mother    Liver cancer Mother        d.64   Lung cancer Father 80       d.70   Colon cancer Father 28   Heart disease Sister    Diabetes Sister     Diabetes Brother    Heart disease Brother    Heart disease Sister    Diabetes Sister    Thyroid cancer Maternal Aunt 78   Breast cancer Maternal Grandmother 72       d.57 dx'ed at 60.   Pancreatic cancer Maternal Grandmother        dx'ed after breast cancer.   Lung cancer Paternal Uncle 28       d.62   Lung cancer Maternal Grandfather        d.68s   Cancer Paternal Grandmother        d.40s - unspecified cancer type   Lung cancer Paternal Uncle        d.60s   Pancreatic cancer Paternal Uncle    SocHx:  reports that she quit smoking about 30 years ago. Her smoking use included cigarettes. She has never used smokeless tobacco. She reports that she does not currently use alcohol. She reports that she does not use drugs.  Physical Exam: Strength 5/5 x4 and SILTx4   Assesment/Plan: 61 y.o. woman with low back and BLE pain 2/2 L4-5 mobile spondylolisthesis, here for L4-5 MIS TLIF. Risks, benefits, and alternatives discussed and the patient would like to continue with surgery.  -  OR today -3C post-op  Judith Part, MD 08/06/21 11:44 AM

## 2021-08-06 NOTE — Anesthesia Postprocedure Evaluation (Signed)
Anesthesia Post Note  Patient: Posey L Wojtowicz  Procedure(s) Performed: Lumbar four-five Minimally Invasive Transforaminal Lumbar Interbody Fusion with Metrex (Left: Spine Lumbar)     Patient location during evaluation: PACU Anesthesia Type: General Level of consciousness: sedated Pain management: pain level controlled Vital Signs Assessment: post-procedure vital signs reviewed and stable Respiratory status: spontaneous breathing and respiratory function stable Cardiovascular status: stable Postop Assessment: no apparent nausea or vomiting Anesthetic complications: no   No notable events documented.  Last Vitals:  Vitals:   08/06/21 1630 08/06/21 1645  BP: 96/82 96/82  Pulse: 93 96  Resp: 14 16  Temp:  36.5 C  SpO2: 98% 98%    Last Pain:  Vitals:   08/06/21 1630  PainSc: [place]

## 2021-08-06 NOTE — Anesthesia Preprocedure Evaluation (Addendum)
Anesthesia Evaluation  Patient identified by MRN, date of birth, ID band Patient awake    Reviewed: Allergy & Precautions, NPO status , Patient's Chart, lab work & pertinent test results  History of Anesthesia Complications Negative for: history of anesthetic complications  Airway Mallampati: II  TM Distance: >3 FB Neck ROM: Full    Dental no notable dental hx. (+) Implants, Dental Advisory Given   Pulmonary neg pulmonary ROS, former smoker,    Pulmonary exam normal        Cardiovascular negative cardio ROS Normal cardiovascular exam     Neuro/Psych  Headaches, PSYCHIATRIC DISORDERS Anxiety Depression    GI/Hepatic Neg liver ROS, hiatal hernia, PUD, GERD  ,  Endo/Other  Hypothyroidism   Renal/GU negative Renal ROS  negative genitourinary   Musculoskeletal  (+) Arthritis ,   Abdominal   Peds  Hematology negative hematology ROS (+)   Anesthesia Other Findings   Reproductive/Obstetrics                            Anesthesia Physical  Anesthesia Plan  ASA: 2  Anesthesia Plan: General   Post-op Pain Management: Tylenol PO (pre-op)   Induction: Intravenous  PONV Risk Score and Plan: 4 or greater and Ondansetron, Dexamethasone, Midazolam, Scopolamine patch - Pre-op and Treatment may vary due to age or medical condition  Airway Management Planned: Oral ETT  Additional Equipment:   Intra-op Plan:   Post-operative Plan: Extubation in OR  Informed Consent: I have reviewed the patients History and Physical, chart, labs and discussed the procedure including the risks, benefits and alternatives for the proposed anesthesia with the patient or authorized representative who has indicated his/her understanding and acceptance.     Dental advisory given  Plan Discussed with: Anesthesiologist and CRNA  Anesthesia Plan Comments:        Anesthesia Quick Evaluation

## 2021-08-07 ENCOUNTER — Encounter (HOSPITAL_COMMUNITY): Payer: Self-pay | Admitting: Neurological Surgery

## 2021-08-07 DIAGNOSIS — M4316 Spondylolisthesis, lumbar region: Secondary | ICD-10-CM | POA: Diagnosis not present

## 2021-08-07 DIAGNOSIS — R42 Dizziness and giddiness: Secondary | ICD-10-CM | POA: Diagnosis not present

## 2021-08-07 DIAGNOSIS — Z853 Personal history of malignant neoplasm of breast: Secondary | ICD-10-CM | POA: Diagnosis not present

## 2021-08-07 DIAGNOSIS — E039 Hypothyroidism, unspecified: Secondary | ICD-10-CM | POA: Diagnosis not present

## 2021-08-07 DIAGNOSIS — R519 Headache, unspecified: Secondary | ICD-10-CM | POA: Diagnosis not present

## 2021-08-07 DIAGNOSIS — J45909 Unspecified asthma, uncomplicated: Secondary | ICD-10-CM | POA: Diagnosis not present

## 2021-08-07 DIAGNOSIS — Z87891 Personal history of nicotine dependence: Secondary | ICD-10-CM | POA: Diagnosis not present

## 2021-08-07 MED ORDER — CYCLOBENZAPRINE HCL 10 MG PO TABS: 10.0000 mg | ORAL_TABLET | Freq: Three times a day (TID) | ORAL | 0 refills | Status: DC | PRN

## 2021-08-07 MED ORDER — OXYCODONE-ACETAMINOPHEN 5-325 MG PO TABS: 1.0000 | ORAL_TABLET | ORAL | 0 refills | Status: AC | PRN

## 2021-08-07 NOTE — TOC Initial Note (Addendum)
Transition of Care [facility]) - Initial/Assessment Note    Patient Details  Name: [name] [name] MRN: [id number] Date of Birth: 10/26/1960  Transition of Care [facility]) CM/SW Contact:    Marilu Favre, RN Phone Number: 08/07/2021, 11:36 AM  Clinical Narrative:                  Spoke to patient via phone. Confirmed face sheet information .   Discussed home health PT/OT.   NCM has called following agencies:   Thayer Headings with [facility] is not in network with [employer].   Hoyle Sauer with [facility] is not in network with patient's insurance.   Levada Dy with [facility] is not in network with [employer].   Cory with [facility] cannot accept referral.   [facility] cannot accept referral.   Lattie Haw with [facility] has no staffing .  Malachy Mood with [facility] is not in network with [employer].   Left message for [facility] with [facility] . [facility] returned call they are not in network with insurance.  Left message with [employer] . Dorian Pod returned call, they do not cover patient's address   Jocelyn Lamer at [facility] does not have the staffing to accept referral.   Tiffany with [facility] checking to see if she can accept referral. Tiffany unable to accept referral.   Patient asking about a lift chair. NCM explained she or family would have to go to a DME sore and pit out make / model and then submit MD order to her insurance company to see if they would cover. Patient voiced understanding   Spoke to patient and daughter regarding OP PT. If Dr Zada Finders agrees they would be willing to go to [facility] in [place]. Spoke to Kimball at MD office. She requested form for OP PT to be faxed to Dr Zada Finders office, he will sign and they will fax to [facility].  FAx [phone]   Patient aware.  Expected Discharge Plan: [place]     Patient Goals and CMS Choice Patient states their goals for this hospitalization and ongoing recovery are::  to return to home CMS Medicare.gov Compare Post Acute Care list provided to:: Patient Choice offered to / list presented to : Patient  Expected Discharge Plan and Services Expected Discharge Plan: [place]   Discharge Planning Services: CM Consult Post Acute Care Choice: [place] arrangements for the past 2 months: Single Family Home Expected Discharge Date: 08/07/21                         HH Arranged: OT, PT          Prior Living Arrangements/Services Living arrangements for the past 2 months: Single Family Home Lives with:: Adult Children Patient language and need for interpreter reviewed:: Yes Do you feel safe going back to the place where you live?: Yes      Need for Family Participation in Patient Care: Yes (Comment) Care giver support system in place?: Yes (comment)   Criminal Activity/Legal Involvement Pertinent to Current Situation/Hospitalization: No - Comment as needed  Activities of Daily Living      Permission Sought/Granted   Permission granted to share information with : No              Emotional Assessment Appearance:: Appears stated age Attitude/Demeanor/Rapport: Engaged Affect (typically observed): Accepting Orientation: : Oriented to Self, Oriented to Place, Oriented to  Time, Oriented  to Situation Alcohol / Substance Use: Not Applicable Psych Involvement: No (comment)  Admission diagnosis:  Spondylolisthesis of lumbar region [M43.16] Patient Active Problem List   Diagnosis Date Noted   Spondylolisthesis of lumbar region 08/06/2021   Spondylolisthesis, lumbar region 12/20/2019   Gastritis and gastroduodenitis    Adenomatous duodenal polyp    Obesity (BMI 30-39.9) 01/24/2019   Anxiety and depression 05/04/2017   Genetic testing 11/03/2016   Vertigo 02/12/2016   Screening for malignant neoplasm of cervix 03/22/2014   Chronic cough 03/22/2014   Hot flashes, menopausal 12/07/2013   History of breast cancer  12/07/2013   Constipation 11/18/2013   Ductal carcinoma in situ of breast 06/02/2012   Osteopenia 04/27/2012   Low back pain radiating to right leg 03/17/2012   Hyperlipidemia 02/27/2012   Numbness and tingling of right leg 02/24/2012   Preventative health care 02/24/2012   Postablative hypothyroidism 06/13/2011   FATIGUE 08/22/2010   BACK PAIN 06/06/2010   Neuropathy ([place]) 05/28/2010   GASTROENTERITIS 04/05/2010   PALPITATIONS 02/07/2010   MIGRAINE, CHRONIC 08/13/2009   B12 deficiency 07/12/2009   HSV 12/01/2006   ALLERGIC RHINITIS 12/01/2006   ASTHMA 12/01/2006   PCP:  Midge Minium, MD Pharmacy:   [facility] [place] [place] [address] Phone: [phone] Fax: [phone]     Social Determinants of Health (SDOH) Interventions    Readmission Risk Interventions No flowsheet data found.

## 2021-08-07 NOTE — Plan of Care (Signed)

## 2021-08-07 NOTE — Progress Notes (Signed)
Neurosurgery Service Progress Note  Subjective: No acute events overnight, lots of back pain but no new or recurrent radicular symptoms, back feels better than preop when standing but very sore in bed  Objective: Vitals:   08/06/21 2034 08/06/21 2344 08/07/21 0425 08/07/21 0800  BP: 99/61 (!) 95/59 111/66 (!) 96/56  Pulse: 94 84 (!) 103 97  Resp: 18 16 18 16   Temp: 97.6 F (36.4 C) 97.8 F (36.6 C) 98 F (36.7 C) 98.1 F (36.7 C)  TempSrc: Oral Oral Oral Oral  SpO2: 98% 95% 97% 98%  Weight:      Height:        Physical Exam: Strength 5/5 x4 and SILTx4  Incisions c/d/i  Assessment & Plan: 61 y.o. woman s/p MIS TLIF for mobile spondy, recovering well.  -discharge home today  Judith Part  08/07/21 9:36 AM

## 2021-08-07 NOTE — Progress Notes (Signed)
Patient awaiting transport via wheelchair by volunteer for discharge home; in no acute distress nor complaints of pain nor discomfort; incision on her back with honeycomb dressing and is clean, dry and intact;; room was checked and accounted for all her belongings; discharge instructions concerning her medications, incision care, follow up appointment and when to call the doctor as needed were all discussed with patient and daughter by RN and both expressed understanding on the instructions given.

## 2021-08-07 NOTE — Evaluation (Signed)
Occupational Therapy Evaluation Patient Details Name: [name] [name] MRN: [id number] DOB: July 29, 1960 Today's Date: 08/07/2021   History of Present Illness 61 y.o. F admitted on 08/06/21 for a L4-5 MIS TLIF. PMH significant for Anxiety, depression, Asthma, Breast Ca, GERD, and hypothyroidism.   Clinical Impression   Pt admitted for procedure listed above. PTA pt reported that she was independent with all ADL's and IADL's, including working as a Merchandiser, retail. At this time, pt is requiring min assist for all ADL's, due to pain and fatigue. She is heavily reliant on the RW for stability and reports she is unable to stand for more than a minute before her legs start to wobble. Recommending HHOT to maximize pt's independence and safety at home. OT will follow acutely.      Recommendations for follow up therapy are one component of a multi-disciplinary discharge planning process, led by the attending physician.  Recommendations may be updated based on patient status, additional functional criteria and insurance authorization.   Follow Up Recommendations  Home health OT    Assistance Recommended at Discharge Set up Supervision/Assistance  Patient can return home with the following A little help with walking and/or transfers;A little help with bathing/dressing/bathroom;Assistance with cooking/housework    Functional Status Assessment  Patient has had a recent decline in their functional status and demonstrates the ability to make significant improvements in function in a reasonable and predictable amount of time.  Equipment Recommendations  BSC/3in1;Other (comment) (RW)    Recommendations for Other Services       Precautions / Restrictions Precautions Precautions: Back Precaution Booklet Issued: Yes (comment) Precaution Comments: Reviewed spinal precautions and compensatory strategies Restrictions Weight Bearing Restrictions: No      Mobility Bed Mobility Overal bed mobility: Needs  Assistance             General bed mobility comments: Up in recliner on entry, pt reporting that she was unable to come to sitting without assist.    Transfers Overall transfer level: Needs assistance Equipment used: Rolling walker (2 wheels) Transfers: Sit to/from Stand Sit to Stand: Min guard           General transfer comment: Min guard for pt to power up and steady, pt very unstable on her feet at this time and heavily reliant on external support      Balance Overall balance assessment: Mild deficits observed, not formally tested                                         ADL either performed or assessed with clinical judgement   ADL Overall ADL's : Needs assistance/impaired                                       General ADL Comments: Pt overall requiring min A for all ADL's and functional mobility, due to pain and difficulty following spinal precautions.     Vision Baseline Vision/History: 1 Wears glasses Ability to See in Adequate Light: 0 Adequate Patient Visual Report: No change from baseline Vision Assessment?: No apparent visual deficits     Perception     Praxis      Pertinent Vitals/Pain Pain Assessment Pain Assessment: Faces Faces Pain Scale: Hurts whole lot Pain Location: Back and surgical site Pain Descriptors / Indicators: Aching, Constant, Discomfort,  Grimacing Pain Intervention(s): Monitored during session, Limited activity within patient's tolerance, Repositioned     Hand Dominance Right   Extremity/Trunk Assessment Upper Extremity Assessment Upper Extremity Assessment: Generalized weakness   Lower Extremity Assessment Lower Extremity Assessment: Defer to PT evaluation   Cervical / Trunk Assessment Cervical / Trunk Assessment: Back Surgery   Communication Communication Communication: No difficulties   Cognition Arousal/Alertness: Awake/alert Behavior During Therapy: WFL for tasks  assessed/performed Overall Cognitive Status: Within Functional Limits for tasks assessed                                       General Comments  VSS on RA, honeycomb dressing intact    Exercises     Shoulder Instructions      Home Living Family/patient expects to be discharged to:: Private residence Living Arrangements: Children Available Help at Discharge: Family;Available 24 hours/day Type of Home: House Home Access: Stairs to enter [employer] of Steps: 2.3 steps   Home Layout: One level     Bathroom Shower/Tub: Teacher, early years/pre: Standard     Home Equipment: Toilet riser          Prior Functioning/Environment Prior Level of Function : Independent/Modified Independent;Working/employed;Driving                        OT Problem List: Decreased strength;Decreased activity tolerance;Impaired balance (sitting and/or standing);Decreased knowledge of use of DME or AE;Decreased knowledge of precautions;Pain      OT Treatment/Interventions: Self-care/ADL training;Therapeutic exercise;Energy conservation;DME and/or AE instruction;Therapeutic activities;Patient/family education;Balance training    OT Goals(Current goals can be found in the care plan section) Acute Rehab OT Goals Patient Stated Goal: to go home OT Goal Formulation: With patient Time For Goal Achievement: 08/21/21 Potential to Achieve Goals: Good  OT Frequency: Min 2X/week    Co-evaluation              AM-PAC OT "6 Clicks" Daily Activity     Outcome Measure Help from another person eating meals?: A Little Help from another person taking care of personal grooming?: A Little Help from another person toileting, which includes using toliet, bedpan, or urinal?: A Little Help from another person bathing (including washing, rinsing, drying)?: A Little Help from another person to put on and taking off regular upper body clothing?: A Little Help from  another person to put on and taking off regular lower body clothing?: A Little 6 Click Score: 18   End of Session Equipment Utilized During Treatment: Rolling walker (2 wheels) Nurse Communication: Mobility status  Activity Tolerance: Patient limited by lethargy;Patient limited by pain Patient left: in chair;with call bell/phone within reach;with family/visitor present  OT Visit Diagnosis: Unsteadiness on feet (R26.81);Other abnormalities of gait and mobility (R26.89);Muscle weakness (generalized) (M62.81)                Time: 9935-7017 OT Time Calculation (min): 31 min Charges:  OT General Charges $OT Visit: 1 Visit OT Evaluation $OT Eval Moderate Complexity: 1 Mod OT Treatments $Self Care/Home Management : 8-22 mins  Keshana Klemz H., OTR/L Acute Rehabilitation  Ronelle Smallman Elane Jalen Daluz 08/07/2021, 11:33 AM

## 2021-08-07 NOTE — Evaluation (Signed)
Physical Therapy Evaluation  Patient Details Name: [name] [name] MRN: [id number] DOB: 1961/01/12 Today's Date: 08/07/2021  History of Present Illness  Pt is a 61 y/o F admitted on 08/06/21 for a L4-5 TLIF. PMH significant for Anxiety, depression, Asthma, Breast Ca, GERD, and hypothyroidism.   Clinical Impression  Pt admitted with above diagnosis. At the time of PT eval, pt was able to demonstrate transfers and ambulation with gross min guard assist to supervision for safety and RW for support. Pt was educated on precautions, positioning recommendations, appropriate activity progression, and car transfer. Pt currently with functional limitations due to the deficits listed below (see PT Problem List). Pt will benefit from skilled PT to increase their independence and safety with mobility to allow discharge to the venue listed below.         Recommendations for follow up therapy are one component of a multi-disciplinary discharge planning process, led by the attending physician.  Recommendations may be updated based on patient status, additional functional criteria and insurance authorization.  Follow Up Recommendations No PT follow up    Assistance Recommended at Discharge PRN  Patient can return home with the following  A little help with walking and/or transfers;Assist for transportation;Help with stairs or ramp for entrance    Equipment Recommendations Rolling walker (2 wheels);BSC/3in1  Recommendations for Other Services       Functional Status Assessment Patient has had a recent decline in their functional status and demonstrates the ability to make significant improvements in function in a reasonable and predictable amount of time.     Precautions / Restrictions Precautions Precautions: Back Precaution Booklet Issued: Yes (comment) Precaution Comments: Reviewed spinal precautions and compensatory strategies Restrictions Weight Bearing Restrictions: No      Mobility  Bed  Mobility               General bed mobility comments: Verbally reviewed log roll technique.    Transfers Overall transfer level: Needs assistance Equipment used: Rolling walker (2 wheels) Transfers: Sit to/from Stand Sit to Stand: Min guard           General transfer comment: Close guard as pt powered up to full stand. VC's for hand placement on seated surface for safety. Heavily reliant on UE support due to pain.    Ambulation/Gait Ambulation/Gait assistance: Min guard, Supervision Gait Distance (Feet): 250 Feet Assistive device: Rolling walker (2 wheels) Gait Pattern/deviations: Step-through pattern, Decreased stride length, Trunk flexed Gait velocity: Decreased Gait velocity interpretation: <1.31 ft/sec, indicative of household ambulator   General Gait Details: VC's for improved posture, closer walker proximity, and forward gaze. Slow but generally steady with the RW for support. Support varied between min guard assist and supervision for safety.  Stairs Stairs: Yes Stairs assistance: Min assist Stair Management: Two rails, Step to pattern, Forwards Number of Stairs: 4 General stair comments: VC's for sequencing and general safety. Daughter present and was educated on safe technique for assist and guarding. Gait belt issued.  Wheelchair Mobility    Modified Rankin (Stroke Patients Only)       Balance Overall balance assessment: Mild deficits observed, not formally tested                                           Pertinent Vitals/Pain Pain Assessment Pain Assessment: Faces Faces Pain Scale: Hurts even more Pain Location: Back and surgical site Pain Descriptors /  Indicators: Aching, Constant, Discomfort, Grimacing Pain Intervention(s): Limited activity within patient's tolerance, Monitored during session, Repositioned    Home Living Family/patient expects to be discharged to:: Private residence Living Arrangements: Children Available  Help at Discharge: Family;Available 24 hours/day Type of Home: House Home Access: Stairs to enter   [employer] of Steps: 3   Home Layout: One level Home Equipment: Toilet riser      Prior Function Prior Level of Function : Independent/Modified Independent;Working/employed;Driving                     Hand Dominance   Dominant Hand: Right    Extremity/Trunk Assessment   Upper Extremity Assessment Upper Extremity Assessment: Defer to OT evaluation    Lower Extremity Assessment Lower Extremity Assessment: Generalized weakness (Consistent with pre-op diagnosis)    Cervical / Trunk Assessment Cervical / Trunk Assessment: Back Surgery  Communication   Communication: No difficulties  Cognition Arousal/Alertness: Awake/alert Behavior During Therapy: WFL for tasks assessed/performed Overall Cognitive Status: Within Functional Limits for tasks assessed                                          General Comments General comments (skin integrity, edema, etc.): VSS on RA, honeycomb dressing intact    Exercises     Assessment/Plan    PT Assessment Patient needs continued PT services  PT Problem List Decreased strength;Decreased activity tolerance;Decreased mobility;Decreased balance;Decreased knowledge of use of DME;Decreased safety awareness;Decreased knowledge of precautions;Pain       PT Treatment Interventions DME instruction;Gait training;Stair training;Functional mobility training;Therapeutic activities;Therapeutic exercise;Neuromuscular re-education;Patient/family education    PT Goals (Current goals can be found in the Care Plan section)  Acute Rehab PT Goals Patient Stated Goal: Decrease pain, home today PT Goal Formulation: With patient/family Time For Goal Achievement: 08/14/21 Potential to Achieve Goals: Good    Frequency Min 5X/week     Co-evaluation               AM-PAC PT "6 Clicks" Mobility  Outcome Measure  Help needed turning from your back to your side while in a flat bed without using bedrails?: A Little Help needed moving from lying on your back to sitting on the side of a flat bed without using bedrails?: A Little Help needed moving to and from a bed to a chair (including a wheelchair)?: A Little Help needed standing up from a chair using your arms (e.g., wheelchair or bedside chair)?: A Little Help needed to walk in hospital room?: A Little Help needed climbing 3-5 steps with a railing? : A Little 6 Click Score: 18    End of Session Equipment Utilized During Treatment: Gait belt Activity Tolerance: Patient tolerated treatment well Patient left: with call bell/phone within reach;in chair;with family/visitor present Nurse Communication: Mobility status PT Visit Diagnosis: Unsteadiness on feet (R26.81);Pain Pain - part of body:  (back)    Time: 5427-0623 PT Time Calculation (min) (ACUTE ONLY): 24 min   Charges:   PT Evaluation $PT Eval Low Complexity: 1 Low PT Treatments $Gait Training: 8-22 mins        Rolinda Roan, PT, DPT Acute Rehabilitation Services Pager: [phone] Office: [phone]   Thelma Comp 08/07/2021, 1:06 PM

## 2021-08-07 NOTE — Discharge Summary (Signed)
Discharge Summary  Date of Admission: 08/06/2021  Date of Discharge: 08/07/21  Attending Physician: Emelda Brothers, MD  Hospital Course: Patient was admitted following an uncomplicated D6-3 MIS TLIF. They were recovered in PACU and transferred to [facility]. Their preop symptoms were completely resolved, their hospital course was uncomplicated and the patient was discharged home on 08/07/21. They will follow up in clinic with me in clinic in 2 weeks.  Neurologic exam at discharge:  Strength 5/5 x4 and SILTx4   Discharge diagnosis: Lumbar spondylolisthesis  Judith Part, MD 08/07/21 9:39 AM

## 2021-08-24 DIAGNOSIS — R42 Dizziness and giddiness: Secondary | ICD-10-CM | POA: Diagnosis not present

## 2021-08-24 DIAGNOSIS — R519 Headache, unspecified: Secondary | ICD-10-CM | POA: Diagnosis not present

## 2021-08-26 DIAGNOSIS — E278 Other specified disorders of adrenal gland: Secondary | ICD-10-CM | POA: Diagnosis not present

## 2021-08-28 DIAGNOSIS — M4316 Spondylolisthesis, lumbar region: Secondary | ICD-10-CM | POA: Diagnosis not present

## 2021-08-30 DIAGNOSIS — M4316 Spondylolisthesis, lumbar region: Secondary | ICD-10-CM | POA: Diagnosis not present

## 2021-09-04 DIAGNOSIS — M4316 Spondylolisthesis, lumbar region: Secondary | ICD-10-CM | POA: Diagnosis not present

## 2021-09-06 DIAGNOSIS — M4316 Spondylolisthesis, lumbar region: Secondary | ICD-10-CM | POA: Diagnosis not present

## 2021-09-11 DIAGNOSIS — M4316 Spondylolisthesis, lumbar region: Secondary | ICD-10-CM | POA: Diagnosis not present

## 2021-09-13 DIAGNOSIS — M4316 Spondylolisthesis, lumbar region: Secondary | ICD-10-CM | POA: Diagnosis not present

## 2021-09-17 ENCOUNTER — Other Ambulatory Visit: Payer: Self-pay

## 2021-09-17 DIAGNOSIS — F419 Anxiety disorder, unspecified: Secondary | ICD-10-CM

## 2021-09-17 MED ORDER — ALPRAZOLAM 0.5 MG PO TABS: 0.5000 mg | ORAL_TABLET | Freq: Two times a day (BID) | ORAL | 1 refills | Status: DC

## 2021-09-17 NOTE — Telephone Encounter (Signed)
Encourage patient to contact the pharmacy for refills or they can request refills through [facility] ? ?(Please schedule appointment if patient has not been seen in over a year) ? ? ? ?WHAT PHARMACY WOULD THEY LIKE THIS SENT TO: [place] [address] [place] [address] Pharmacy number [phone] ? ?MEDICATION NAME & DOSE:ALPRAZolam (XANAX) 0.5 MG tablet  ? ?NOTES/COMMENTS FROM PATIENT:Note Pt changed Pharmacy pt wants 90 day supply  ? ? ? ? ? ?[place] office please notify patient: ?It takes 48-72 hours to process rx refill requests ?Ask patient to call pharmacy to ensure rx is ready before heading there.  ? ?

## 2021-09-17 NOTE — Telephone Encounter (Signed)
Patient is requesting a refill of the following medications: ?Requested Prescriptions  ? ?Pending Prescriptions Disp Refills  ? ALPRAZolam (XANAX) 0.5 MG tablet 60 tablet 1  ?  Sig: Take 1 tablet (0.5 mg total) by mouth 2 (two) times daily.  ? ? ?Date of patient request: 09/17/21 ?Last office visit: 07/02/21 ?Date of last refill: 05/16/21 ?Last refill amount: 60 ? ? ?

## 2021-09-18 DIAGNOSIS — M4316 Spondylolisthesis, lumbar region: Secondary | ICD-10-CM | POA: Diagnosis not present

## 2021-09-20 DIAGNOSIS — M4316 Spondylolisthesis, lumbar region: Secondary | ICD-10-CM | POA: Diagnosis not present

## 2021-09-23 DIAGNOSIS — E890 Postprocedural hypothyroidism: Secondary | ICD-10-CM | POA: Diagnosis not present

## 2021-09-25 DIAGNOSIS — M4316 Spondylolisthesis, lumbar region: Secondary | ICD-10-CM | POA: Diagnosis not present

## 2021-09-26 DIAGNOSIS — K080 Exfoliation of teeth due to systemic causes: Secondary | ICD-10-CM | POA: Diagnosis not present

## 2021-10-02 DIAGNOSIS — M4316 Spondylolisthesis, lumbar region: Secondary | ICD-10-CM | POA: Diagnosis not present

## 2021-10-04 DIAGNOSIS — M4316 Spondylolisthesis, lumbar region: Secondary | ICD-10-CM | POA: Diagnosis not present

## 2021-10-07 DIAGNOSIS — M4316 Spondylolisthesis, lumbar region: Secondary | ICD-10-CM | POA: Diagnosis not present

## 2021-10-09 DIAGNOSIS — M4316 Spondylolisthesis, lumbar region: Secondary | ICD-10-CM | POA: Diagnosis not present

## 2021-10-14 DIAGNOSIS — J40 Bronchitis, not specified as acute or chronic: Secondary | ICD-10-CM | POA: Diagnosis not present

## 2021-10-16 ENCOUNTER — Other Ambulatory Visit: Payer: Self-pay | Admitting: Family

## 2021-10-16 ENCOUNTER — Other Ambulatory Visit: Payer: Self-pay | Admitting: *Deleted

## 2021-10-16 DIAGNOSIS — M4316 Spondylolisthesis, lumbar region: Secondary | ICD-10-CM | POA: Diagnosis not present

## 2021-10-16 MED ORDER — VALACYCLOVIR HCL 500 MG PO TABS: 500.0000 mg | ORAL_TABLET | Freq: Every day | ORAL | 6 refills | Status: DC | PRN

## 2021-10-16 NOTE — Telephone Encounter (Signed)
Patient is requesting a refill of the following medications: ?Requested Prescriptions  ? ?Pending Prescriptions Disp Refills  ? valACYclovir (VALTREX) 500 MG tablet 30 tablet 6  ?  Sig: Take 1 tablet (500 mg total) by mouth daily as needed (outbreaks).  ? ? ?Date of patient request: 10/16/21 ?Last office visit: 09/23/21 ?Date of last refill: 03/15/22 ?Last refill amount: 30 ? ?

## 2021-10-16 NOTE — Telephone Encounter (Signed)
Encourage patient to contact the pharmacy for refills or they can request refills through [facility] ? ?(Please schedule appointment if patient has not been seen in over a year) ? ? ? ?WHAT PHARMACY WOULD THEY LIKE THIS SENT TO: On file ? ?MEDICATION NAME & DOSE: valacyclovir '500mg'$  tablet ? ?NOTES/COMMENTS FROM PATIENT:  ? ? ?[place] office please notify patient: ?It takes 48-72 hours to process rx refill requests ?Ask patient to call pharmacy to ensure rx is ready before heading there.   ?

## 2021-10-18 DIAGNOSIS — M4316 Spondylolisthesis, lumbar region: Secondary | ICD-10-CM | POA: Diagnosis not present

## 2021-10-25 DIAGNOSIS — D649 Anemia, unspecified: Secondary | ICD-10-CM | POA: Diagnosis not present

## 2021-11-05 DIAGNOSIS — I83813 Varicose veins of bilateral lower extremities with pain: Secondary | ICD-10-CM | POA: Diagnosis not present

## 2021-11-18 ENCOUNTER — Telehealth: Payer: Self-pay

## 2021-11-18 DIAGNOSIS — F419 Anxiety disorder, unspecified: Secondary | ICD-10-CM

## 2021-11-18 MED ORDER — ALPRAZOLAM 0.5 MG PO TABS: 0.5000 mg | ORAL_TABLET | Freq: Two times a day (BID) | ORAL | 1 refills | Status: DC

## 2021-11-18 NOTE — Telephone Encounter (Signed)
Prescription filled at pt's request ?

## 2021-11-18 NOTE — Telephone Encounter (Signed)
MEDICATION:ALPRAZolam (XANAX) 0.5 MG tablet ? ?PHARMACY: [place], [facility] ? ?Comments:  ? ?**Let patient know to contact pharmacy at the end of the day to make sure medication is ready. ** ? ?** Please notify patient to allow 48-72 hours to process** ? ?**Encourage patient to contact the pharmacy for refills or they can request refills through [facility]** ? ? ?

## 2021-11-19 NOTE — Telephone Encounter (Signed)
This was filled yesterday

## 2021-11-20 DIAGNOSIS — I83811 Varicose veins of right lower extremities with pain: Secondary | ICD-10-CM | POA: Diagnosis not present

## 2021-12-18 DIAGNOSIS — I83812 Varicose veins of left lower extremities with pain: Secondary | ICD-10-CM | POA: Diagnosis not present

## 2021-12-19 DIAGNOSIS — I83811 Varicose veins of right lower extremities with pain: Secondary | ICD-10-CM | POA: Diagnosis not present

## 2021-12-27 ENCOUNTER — Encounter: Payer: Self-pay | Admitting: Family Medicine

## 2021-12-27 ENCOUNTER — Ambulatory Visit: Payer: Federal, State, Local not specified - PPO | Admitting: Family Medicine

## 2021-12-27 VITALS — BP 120/70 | HR 92 | Temp 97.6°F | Resp 16 | Ht 62.0 in | Wt 153.4 lb

## 2021-12-27 DIAGNOSIS — R768 Other specified abnormal immunological findings in serum: Secondary | ICD-10-CM

## 2021-12-27 DIAGNOSIS — M898X9 Other specified disorders of bone, unspecified site: Secondary | ICD-10-CM

## 2021-12-27 LAB — CBC WITH DIFFERENTIAL/PLATELET
Basophils Absolute: 0 10*3/uL (ref 0.0–0.1)
Basophils Relative: 0.3 % (ref 0.0–3.0)
Eosinophils Absolute: 0 10*3/uL (ref 0.0–0.7)
Eosinophils Relative: 0.8 % (ref 0.0–5.0)
HCT: 40.6 % (ref 36.0–46.0)
Hemoglobin: 13.3 g/dL (ref 12.0–15.0)
Lymphocytes Relative: 34.1 % (ref 12.0–46.0)
Lymphs Abs: 1.9 10*3/uL (ref 0.7–4.0)
MCHC: 32.7 g/dL (ref 30.0–36.0)
MCV: 88.6 fl (ref 78.0–100.0)
Monocytes Absolute: 0.4 10*3/uL (ref 0.1–1.0)
Monocytes Relative: 8 % (ref 3.0–12.0)
Neutro Abs: 3.1 10*3/uL (ref 1.4–7.7)
Neutrophils Relative %: 56.8 % (ref 43.0–77.0)
Platelets: 241 10*3/uL (ref 150.0–400.0)
RBC: 4.59 Mil/uL (ref 3.87–5.11)
RDW: 13.8 % (ref 11.5–15.5)
WBC: 5.5 10*3/uL (ref 4.0–10.5)

## 2021-12-27 LAB — BASIC METABOLIC PANEL
BUN: 9 mg/dL (ref 6–23)
CO2: 30 mEq/L (ref 19–32)
Calcium: 10.2 mg/dL (ref 8.4–10.5)
Chloride: 100 mEq/L (ref 96–112)
Creatinine, Ser: 0.7 mg/dL (ref 0.40–1.20)
GFR: 92.13 mL/min (ref >60.00)
Glucose, Bld: 77 mg/dL (ref 70–99)
Potassium: 4.2 mEq/L (ref 3.5–5.1)
Sodium: 138 mEq/L (ref 135–145)

## 2021-12-27 LAB — HEPATIC FUNCTION PANEL
ALT: 15 U/L (ref 0–35)
AST: 19 U/L (ref 0–37)
Albumin: 4.6 g/dL (ref 3.5–5.2)
Alkaline Phosphatase: 103 U/L (ref 39–117)
Bilirubin, Direct: 0.1 mg/dL (ref 0.0–0.3)
Total Bilirubin: 0.3 mg/dL (ref 0.2–1.2)
Total Protein: 7.7 g/dL (ref 6.0–8.3)

## 2021-12-27 MED ORDER — PREDNISONE 10 MG PO TABS: ORAL_TABLET | ORAL | 0 refills | Status: DC

## 2021-12-27 NOTE — Progress Notes (Signed)
   Subjective:    Patient ID: [name] [name], female    DOB: 09/26/1960, 61 y.o.   MRN: [id number]  HPI R leg pain- described as an aching pain that starts in thigh and radiates down into lower leg.  Has been dx'd w/ bursitis of R hip.  Pain is constant but will worsen at times.  Alternates ibuprofen w/ tylenol w/o relief.  Pt reports this pain is different than the pain in her hip.  'seems to be spreading'.  Pt has been seeing Dr Malvin Johns in [place] but she doesn't feel comfortable returning b/c she was never examined and no imaging was done.  'i'm so afraid I have bone cancer'.    L shoulder pain- pt also has aching pain in L shoulder.  Pt recently had to brace herself to keep from falling and ended up putting a lot of weight on L arm/shoulder.     Review of Systems For ROS see HPI     Objective:   Physical Exam Vitals reviewed.  Constitutional:      General: She is not in acute distress.    Appearance: Normal appearance. She is not ill-appearing.  HENT:     Head: Normocephalic and atraumatic.  Musculoskeletal:        General: Tenderness (TTP over R thigh and lower leg, L shoulder) present.     Right lower leg: No edema.     Left lower leg: No edema.  Skin:    General: Skin is warm and dry.     Findings: No bruising, erythema or rash.  Neurological:     General: No focal deficit present.     Mental Status: She is alert and oriented to person, place, and time.  Psychiatric:        Mood and Affect: Mood normal.        Behavior: Behavior normal.     Comments: Pt is fearful of having cancer due to bone pain           Assessment & Plan:  Bone pain- new.  Pt is fearful of cancer.  Feels that Ortho was dismissive and told her she had bursitis w/o a physical exam or any imaging.  Pain is worsening and 'spreading'.  Will get labs to r/o underlying metabolic cause of bone pain- hypercalcemia, autoimmune condition, etc.  Will start Prednisone taper for pain relief.  Refer to new  ortho for complete evaluation.  Pt expressed understanding and is in agreement w/ plan.

## 2021-12-30 ENCOUNTER — Telehealth: Payer: Self-pay

## 2021-12-31 LAB — ANTI-NUCLEAR AB-TITER (ANA TITER): ANA Titer 1: 1:80 {titer} — ABNORMAL HIGH

## 2021-12-31 LAB — PTH, INTACT AND CALCIUM
Calcium: 10 mg/dL (ref 8.6–10.4)
PTH: 21 pg/mL (ref 16–77)

## 2021-12-31 LAB — ANA: Anti Nuclear Antibody (ANA): POSITIVE — AB

## 2021-12-31 LAB — RHEUMATOID FACTOR: Rheumatoid fact SerPl-aCnc: <14 IU/mL (ref <14)

## 2022-01-01 ENCOUNTER — Telehealth: Payer: Self-pay

## 2022-01-01 NOTE — Telephone Encounter (Signed)
-----   Message from Midge Minium, MD sent at 12/31/2021  2:42 PM EDT ----- Your ANA is slightly positive.  Given your symptoms, I think it would be appropriate to refer to Rheumatology for a complete evaluation.  They will call you to schedule.

## 2022-01-01 NOTE — Telephone Encounter (Signed)
Informed pt of lab results  

## 2022-01-02 DIAGNOSIS — I83811 Varicose veins of right lower extremities with pain: Secondary | ICD-10-CM | POA: Diagnosis not present

## 2022-01-06 ENCOUNTER — Other Ambulatory Visit: Payer: Self-pay

## 2022-01-06 MED ORDER — BUPROPION HCL ER (XL) 150 MG PO TB24: 150.0000 mg | ORAL_TABLET | Freq: Every day | ORAL | 1 refills | Status: DC

## 2022-01-06 NOTE — Telephone Encounter (Signed)
Pt called stating that she needs a refill of bupropion 150 mg called into [facility] [place].

## 2022-01-08 ENCOUNTER — Other Ambulatory Visit: Payer: Self-pay

## 2022-01-08 DIAGNOSIS — F32A Depression, unspecified: Secondary | ICD-10-CM

## 2022-01-08 MED ORDER — BUPROPION HCL ER (XL) 150 MG PO TB24: 150.0000 mg | ORAL_TABLET | Freq: Every day | ORAL | 1 refills | Status: DC

## 2022-01-15 DIAGNOSIS — M25551 Pain in right hip: Secondary | ICD-10-CM | POA: Diagnosis not present

## 2022-01-20 ENCOUNTER — Telehealth: Payer: Self-pay | Admitting: Family Medicine

## 2022-01-20 DIAGNOSIS — F419 Anxiety disorder, unspecified: Secondary | ICD-10-CM

## 2022-01-20 MED ORDER — ALPRAZOLAM 0.5 MG PO TABS: 0.5000 mg | ORAL_TABLET | Freq: Two times a day (BID) | ORAL | 1 refills | Status: DC

## 2022-01-20 NOTE — Telephone Encounter (Signed)
Encourage patient to contact the pharmacy for refills or they can request refills through [facility]  (Please schedule appointment if patient has not been seen in over a year)    WHAT PHARMACY WOULD THEY LIKE THIS SENT TO: [facility] [address] [phone]  MEDICATION NAME & DOSE: Alprazolam 0.5  NOTES/COMMENTS FROM PATIENT:      [place] office please notify patient: It takes 48-72 hours to process rx refill requests Ask patient to call pharmacy to ensure rx is ready before heading there.

## 2022-01-20 NOTE — Telephone Encounter (Signed)
Prescription filled at pt's request ?

## 2022-01-29 DIAGNOSIS — M25551 Pain in right hip: Secondary | ICD-10-CM | POA: Diagnosis not present

## 2022-02-12 DIAGNOSIS — M4316 Spondylolisthesis, lumbar region: Secondary | ICD-10-CM | POA: Diagnosis not present

## 2022-02-12 DIAGNOSIS — R202 Paresthesia of skin: Secondary | ICD-10-CM | POA: Diagnosis not present

## 2022-02-16 IMAGING — CR DG CHEST 2V
2 series · 2 of 2 positions shown · non-contrast
Comparison: 03/22/2014

CLINICAL DATA: Chronic cough for 1 year.  Ex-smoker.

EXAM:
CHEST - 2 VIEW

[w chest pa]
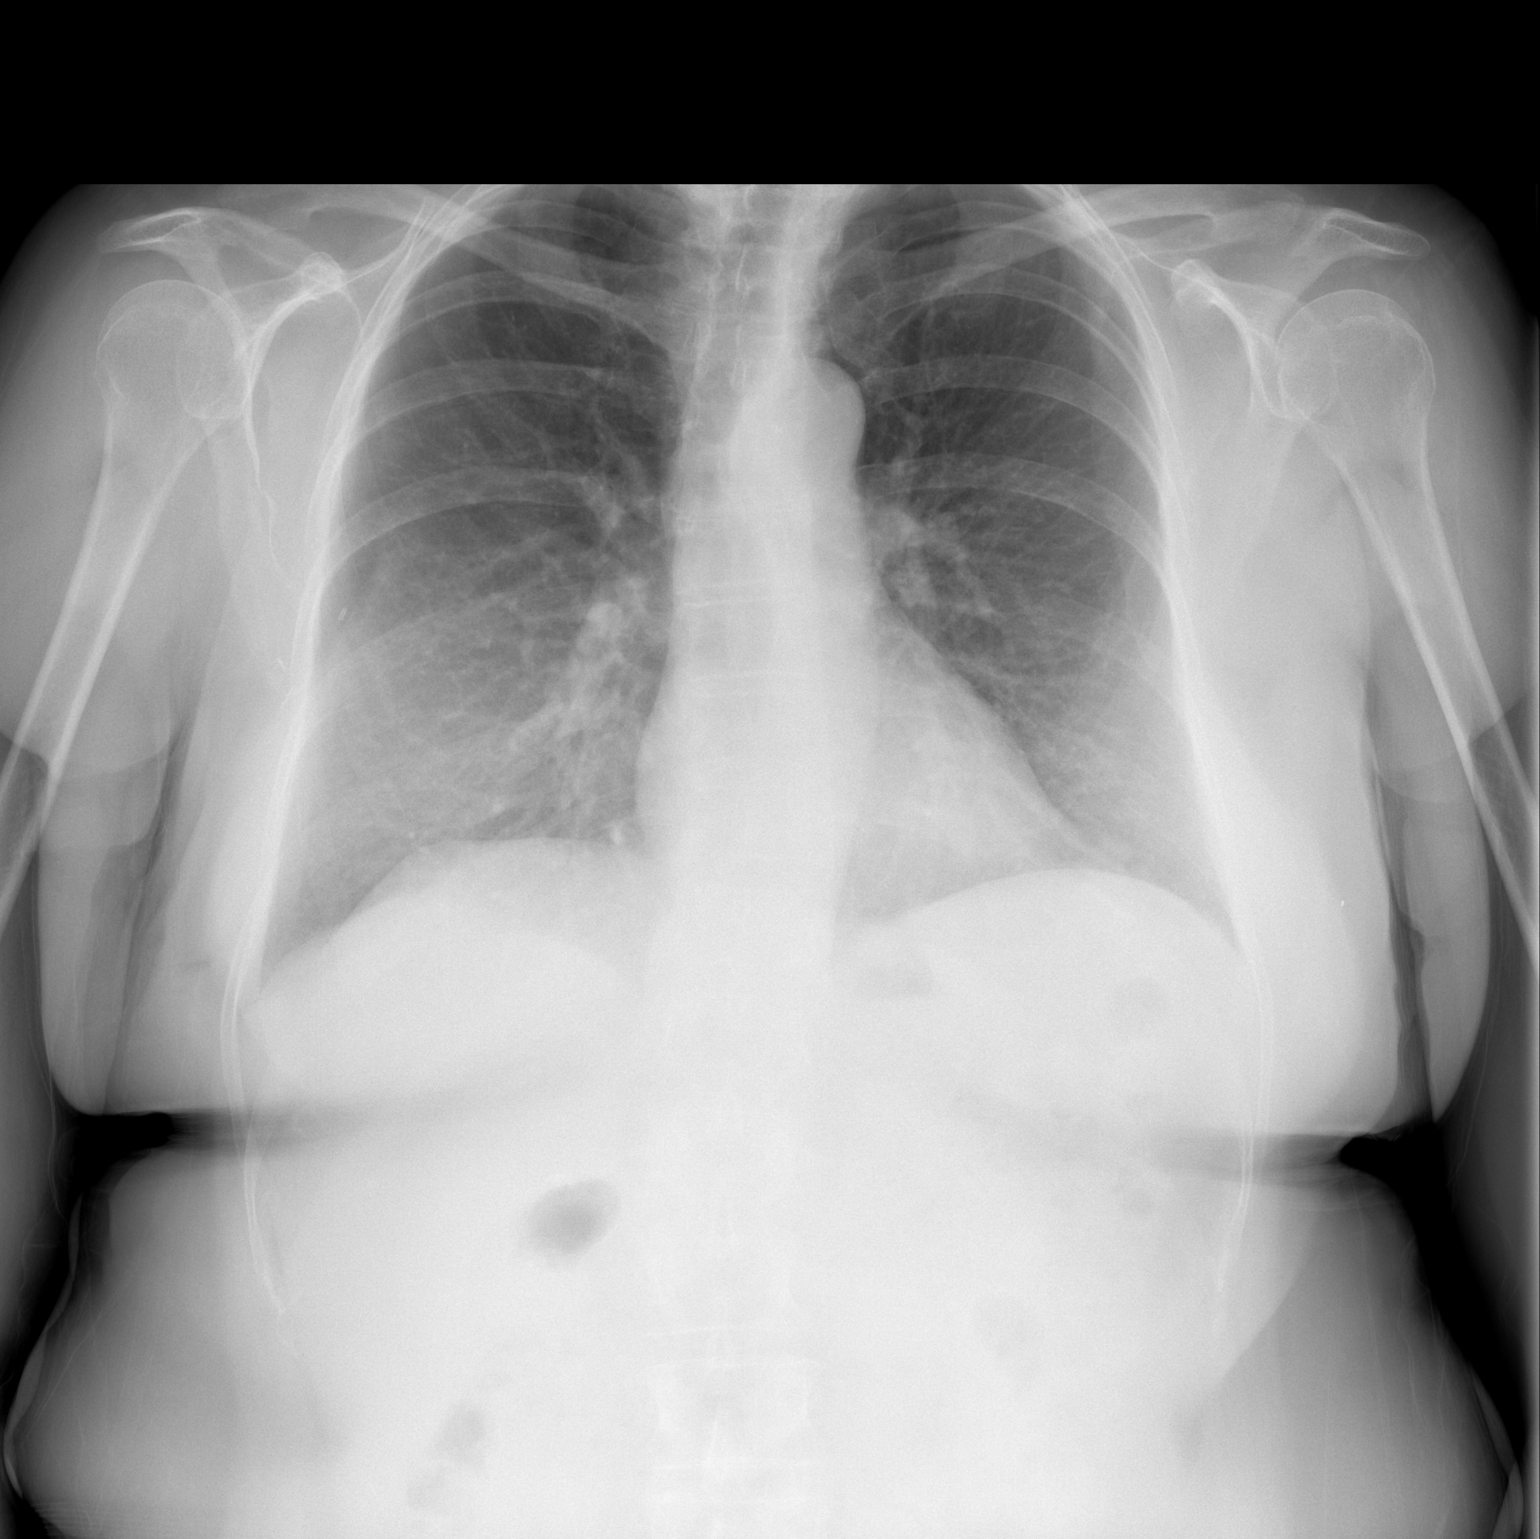

[w chest lat]
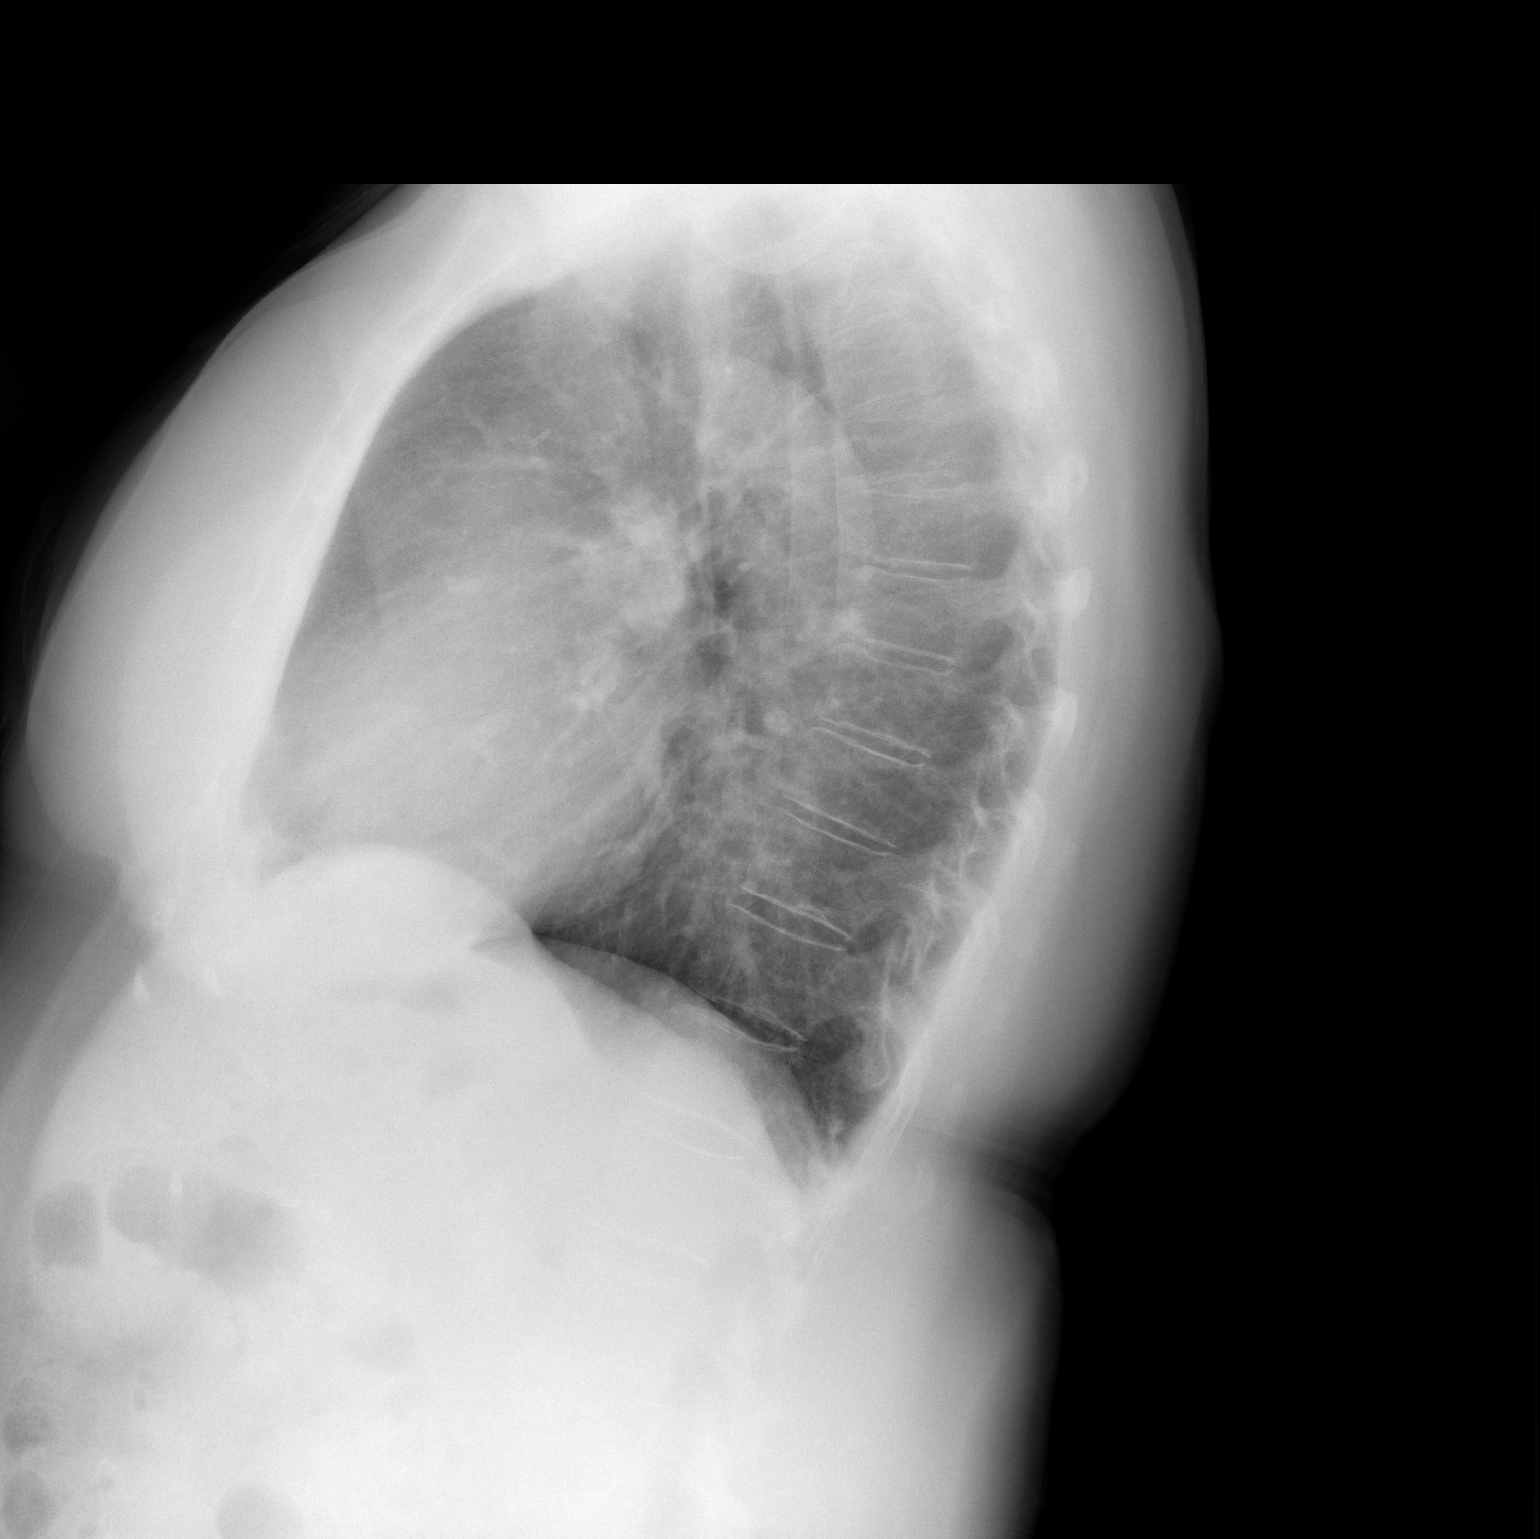

[2 of 2 positions shown; findings below may reference images not displayed]

FINDINGS: Normal sized heart. Clear lungs with normal vascularity. Bilateral
breast implants. Right axillary surgical clips. Mild scoliosis.
IMPRESSION: No acute abnormality.

## 2022-03-13 DIAGNOSIS — G609 Hereditary and idiopathic neuropathy, unspecified: Secondary | ICD-10-CM | POA: Diagnosis not present

## 2022-03-13 DIAGNOSIS — M5416 Radiculopathy, lumbar region: Secondary | ICD-10-CM | POA: Diagnosis not present

## 2022-03-18 DIAGNOSIS — M4316 Spondylolisthesis, lumbar region: Secondary | ICD-10-CM | POA: Diagnosis not present

## 2022-03-18 DIAGNOSIS — M545 Low back pain, unspecified: Secondary | ICD-10-CM | POA: Diagnosis not present

## 2022-03-24 ENCOUNTER — Other Ambulatory Visit: Payer: Self-pay | Admitting: Family Medicine

## 2022-03-24 DIAGNOSIS — F419 Anxiety disorder, unspecified: Secondary | ICD-10-CM

## 2022-03-25 DIAGNOSIS — Z20828 Contact with and (suspected) exposure to other viral communicable diseases: Secondary | ICD-10-CM | POA: Diagnosis not present

## 2022-03-25 DIAGNOSIS — Z1152 Encounter for screening for COVID-19: Secondary | ICD-10-CM | POA: Diagnosis not present

## 2022-03-27 ENCOUNTER — Ambulatory Visit (INDEPENDENT_AMBULATORY_CARE_PROVIDER_SITE_OTHER): Payer: Federal, State, Local not specified - PPO | Admitting: Family Medicine

## 2022-03-27 ENCOUNTER — Encounter: Payer: Self-pay | Admitting: Family Medicine

## 2022-03-27 ENCOUNTER — Telehealth (HOSPITAL_BASED_OUTPATIENT_CLINIC_OR_DEPARTMENT_OTHER): Payer: Self-pay

## 2022-03-27 VITALS — BP 128/70 | HR 85 | Temp 98.1°F | Resp 16 | Ht 61.0 in | Wt 148.0 lb

## 2022-03-27 DIAGNOSIS — Z0000 Encounter for general adult medical examination without abnormal findings: Secondary | ICD-10-CM | POA: Diagnosis not present

## 2022-03-27 DIAGNOSIS — E538 Deficiency of other specified B group vitamins: Secondary | ICD-10-CM

## 2022-03-27 DIAGNOSIS — E785 Hyperlipidemia, unspecified: Secondary | ICD-10-CM | POA: Diagnosis not present

## 2022-03-27 DIAGNOSIS — Z23 Encounter for immunization: Secondary | ICD-10-CM | POA: Diagnosis not present

## 2022-03-27 DIAGNOSIS — M8580 Other specified disorders of bone density and structure, unspecified site: Secondary | ICD-10-CM | POA: Diagnosis not present

## 2022-03-27 LAB — LIPID PANEL
Cholesterol: 203 mg/dL — ABNORMAL HIGH (ref 0–200)
HDL: 59.3 mg/dL (ref >39.00)
LDL Cholesterol: 123 mg/dL — ABNORMAL HIGH (ref 0–99)
NonHDL: 143.71
Total CHOL/HDL Ratio: 3
Triglycerides: 105 mg/dL (ref 0.0–149.0)
VLDL: 21 mg/dL (ref 0.0–40.0)

## 2022-03-27 LAB — CBC WITH DIFFERENTIAL/PLATELET
Basophils Absolute: 0 10*3/uL (ref 0.0–0.1)
Basophils Relative: 0.2 % (ref 0.0–3.0)
Eosinophils Absolute: 0.1 10*3/uL (ref 0.0–0.7)
Eosinophils Relative: 1 % (ref 0.0–5.0)
HCT: 40.1 % (ref 36.0–46.0)
Hemoglobin: 13.4 g/dL (ref 12.0–15.0)
Lymphocytes Relative: 32.8 % (ref 12.0–46.0)
Lymphs Abs: 1.8 10*3/uL (ref 0.7–4.0)
MCHC: 33.4 g/dL (ref 30.0–36.0)
MCV: 87.8 fl (ref 78.0–100.0)
Monocytes Absolute: 0.5 10*3/uL (ref 0.1–1.0)
Monocytes Relative: 8.2 % (ref 3.0–12.0)
Neutro Abs: 3.2 10*3/uL (ref 1.4–7.7)
Neutrophils Relative %: 57.8 % (ref 43.0–77.0)
Platelets: 228 10*3/uL (ref 150.0–400.0)
RBC: 4.56 Mil/uL (ref 3.87–5.11)
RDW: 14.2 % (ref 11.5–15.5)
WBC: 5.5 10*3/uL (ref 4.0–10.5)

## 2022-03-27 LAB — VITAMIN D 25 HYDROXY (VIT D DEFICIENCY, FRACTURES): VITD: >120 ng/mL

## 2022-03-27 LAB — TSH: TSH: 0.31 u[IU]/mL — ABNORMAL LOW (ref 0.35–5.50)

## 2022-03-27 LAB — HEPATIC FUNCTION PANEL
ALT: 13 U/L (ref 0–35)
AST: 18 U/L (ref 0–37)
Albumin: 4.4 g/dL (ref 3.5–5.2)
Alkaline Phosphatase: 89 U/L (ref 39–117)
Bilirubin, Direct: 0.1 mg/dL (ref 0.0–0.3)
Total Bilirubin: 0.5 mg/dL (ref 0.2–1.2)
Total Protein: 7.4 g/dL (ref 6.0–8.3)

## 2022-03-27 LAB — BASIC METABOLIC PANEL
BUN: 13 mg/dL (ref 6–23)
CO2: 27 mEq/L (ref 19–32)
Calcium: 9.9 mg/dL (ref 8.4–10.5)
Chloride: 103 mEq/L (ref 96–112)
Creatinine, Ser: 0.76 mg/dL (ref 0.40–1.20)
GFR: 83.33 mL/min (ref >60.00)
Glucose, Bld: 82 mg/dL (ref 70–99)
Potassium: 3.9 mEq/L (ref 3.5–5.1)
Sodium: 140 mEq/L (ref 135–145)

## 2022-03-27 LAB — VITAMIN B12: Vitamin B-12: 314 pg/mL (ref 211–911)

## 2022-03-27 NOTE — Assessment & Plan Note (Signed)
Chronic problem.  Attempting to control w/ diet and exercise.  Check labs and determine if medication is needed

## 2022-03-27 NOTE — Assessment & Plan Note (Signed)
Due for repeat DEXA- ordered.  Check Vit D and replete prn.

## 2022-03-27 NOTE — Assessment & Plan Note (Signed)
Pt's PE WNL.  UTD on pap, colonoscopy, Tdap.  DEXA ordered.  Flu shot given.  Check labs.  Anticipatory guidance provided.

## 2022-03-27 NOTE — Progress Notes (Signed)
   Subjective:    Patient ID: [name] [name], female    DOB: [birth year], 61 y.o.   MRN: [id number]  HPI CPE- UTD on pap, colonoscopy, Tdap.  No need for mammo due to mastectomy.  Will get flu shot today.  Patient Care Team    Relationship Specialty Notifications Start End  Midge Minium, MD PCP - General Family Medicine  06/10/12   Magrinat, Virgie Dad, MD (Inactive) Consulting Physician Oncology  05/30/15   Fanny Skates, MD Consulting Physician General Surgery  05/30/15   Malachy Mood, MD Consulting Physician Endocrinology  07/24/17   Delaney Meigs, MD  Gastroenterology  03/26/21     Health Maintenance  Topic Date Due   DEXA SCAN  02/02/2022   INFLUENZA VACCINE  02/04/2022   COVID-19 Vaccine (5 - Pfizer risk series) 04/12/2022 (Originally 05/21/2021)   PAP SMEAR-Modifier  07/23/2022   COLONOSCOPY (Pts 45-74yr Insurance coverage will need to be confirmed)  05/02/2025   TETANUS/TDAP  01/25/2030   Hepatitis C Screening  Completed   HIV Screening  Completed   Zoster Vaccines- Shingrix  Completed   HPV VACCINES  Aged Out      Review of Systems Patient reports no vision/ hearing changes, adenopathy, fever, persistant/recurrent hoarseness , swallowing issues, chest pain, palpitations, edema, persistant/recurrent cough, hemoptysis, dyspnea (rest/exertional/paroxysmal nocturnal), gastrointestinal bleeding (melena, rectal bleeding), abdominal pain, significant heartburn, bowel changes, GU symptoms (dysuria, hematuria, incontinence), Gyn symptoms (abnormal  bleeding, pain),  syncope, focal weakness, memory loss, skin/hair/nail changes, abnormal bruising or bleeding, anxiety, or depression.   + 5 lb weight loss    Objective:   Physical Exam General Appearance:    Alert, cooperative, no distress, appears stated age  Head:    Normocephalic, without obvious abnormality, atraumatic  Eyes:    PERRL, conjunctiva/corneas clear, EOM's intact, fundi    benign, both eyes  Ears:    Normal  TM's and external ear canals, both ears  Nose:   Nares normal, septum midline, mucosa normal, no drainage    or sinus tenderness  Throat:   Lips, mucosa, and tongue normal; teeth and gums normal  Neck:   Supple, symmetrical, trachea midline, no adenopathy;    Thyroid: no enlargement/tenderness/nodules  Back:     Symmetric, no curvature, ROM normal, no CVA tenderness  Lungs:     Clear to auscultation bilaterally, respirations unlabored  Chest Wall:    No tenderness or deformity   Heart:    Regular rate and rhythm, S1 and S2 normal, no murmur, rub   or gallop  Breast Exam:    Deferred to mammo  Abdomen:     Soft, non-tender, bowel sounds active all four quadrants,    no masses, no organomegaly  Genitalia:    Deferred   Rectal:    Extremities:   Extremities normal, atraumatic, no cyanosis or edema  Pulses:   2+ and symmetric all extremities  Skin:   Skin color, texture, turgor normal, no rashes or lesions  Lymph nodes:   Cervical, supraclavicular, and axillary nodes normal  Neurologic:   CNII-XII intact, normal strength, sensation and reflexes    throughout          Assessment & Plan:

## 2022-03-27 NOTE — Patient Instructions (Signed)
Follow up in 6 months to recheck cholesterol and thyroid We'll notify you of your lab results and make any changes if needed Continue to work on healthy diet and regular exercise- you can do it! I placed the order for your bone density- they should call you to schedule Call with any questions or concerns Happy Fall!!!

## 2022-03-28 ENCOUNTER — Other Ambulatory Visit: Payer: Self-pay

## 2022-03-28 ENCOUNTER — Telehealth: Payer: Self-pay | Admitting: Family Medicine

## 2022-03-28 DIAGNOSIS — G959 Disease of spinal cord, unspecified: Secondary | ICD-10-CM | POA: Diagnosis not present

## 2022-03-28 DIAGNOSIS — E785 Hyperlipidemia, unspecified: Secondary | ICD-10-CM

## 2022-03-28 DIAGNOSIS — M4316 Spondylolisthesis, lumbar region: Secondary | ICD-10-CM | POA: Diagnosis not present

## 2022-03-28 DIAGNOSIS — M5416 Radiculopathy, lumbar region: Secondary | ICD-10-CM | POA: Diagnosis not present

## 2022-03-28 NOTE — Telephone Encounter (Signed)
Called and LM to schedule a Lab only visit for next week to recheck vitamin D labs are pended

## 2022-03-28 NOTE — Progress Notes (Signed)
Inforemd pt of lab results . Lab visit is scheduled and orders for repeat TSH and Vit D are in

## 2022-03-28 NOTE — Telephone Encounter (Signed)
Lab Result (Critical or Stat) Call Type Lab Send to RN Reason for Call Report lab results Initial Comment Caller states he has critical lab results. Translation No Nurse Assessment Nurse: Martyn Ehrich, RN, Felicia Date/Time (Eastern Time): 03/27/2022 5:15:43 PM Is there an on-call provider listed? ---Yes Please list name of person reporting value (Lab Employee) and a contact number. ---Sal MR no. is [id number] Outpt Please document the following items: Lab name Lab value (read back to lab to verify) Reference range for lab value Date and time blood was drawn Collect time of birth for bilirubin results ---collected today 0832. Results critical high Vit D >120 NL range is up to 100 Please collect the patient contact information from the lab. (name, phone number and address) ---Pt phone no. is [phone] Pt address is [place] [address] Disp. Time Eilene Ghazi Time) Disposition Final User 03/27/2022 5:21:01 PM Called On-Call Provider [place], RN, [facility] 12/24/5091 2:67:12 PM Lab Call Martyn Ehrich, RN, Solmon Ice Reason: see above 03/27/2022 5:23:32 PM Clinical Call Yes Martyn Ehrich, RN, Solmon Ice 4/58/0998 3:38:25 PM Send To RN Personal Martyn Ehrich, RN, Solmon Ice 0/53/9767 3:41:93 PM Send To Call Back Waiting For Nurse Melbourne Abts, Leretha Dykes Final Disposition 03/27/2022 5:23:32 PM Clinical Call Yes Martyn Ehrich, RN, Solmon Ice PLEASE NOTE: All timestamps contained within this report are represented as [place] Standard Time. CONFIDENTIALTY NOTICE: This fax transmission is intended only for the addressee. It contains information that is legally privileged, confidential or otherwise protected from use or disclosure. If you are not the intended recipient, you are strictly prohibited from reviewing, disclosing, copying using or disseminating any of this information or taking any action in reliance on or regarding this information. If you have received this fax in error, please notify us immediately by telephone so that  we can arrange for its return to [place]. Phone: [phone], Toll-Free: [phone], Fax: [phone] Page: 2 of 2 Call Id: [id number] Comments User: Daphene Calamity, RN Date/Time Eilene Ghazi Time): 03/27/2022 5:24:16 PM left a generic message for pt that nurse is trying to reach her with a message from an MD. She can call the office afterhours and ask for this nurse. User: Daphene Calamity, RN Date/Time Eilene Ghazi Time): 03/27/2022 5:39:57 PM told caller to stop all supplements or meds that have Vit D in them and she agreed. She wanted to know what symptoms could results from this - told her , " nausea and vomiting, weakness, and frequent urination. Vitamin D toxicity might progress to bone pain and kidney problems, such as the formation of calcium stones." per http://www.hawkins.com/ faq-20058108#:~:text=The%17min%20consequence%20of%20vitamin,the%20formation%20of%20calcium %20stones. Pt denies symptoms Paging DoctorName Phone DateTime Result/ Outcome Message Type Notes HThersa Salt MD [id number]/21/2023 5:21:01 PM Called On Call Provider - Reached Doctor Paged HThersa Salt MD 03/27/2022 5:22:24 PM Spoke with On Call - General Message Result call pt and tell them to stop taking any Vit D - she will p

## 2022-03-31 ENCOUNTER — Other Ambulatory Visit (HOSPITAL_BASED_OUTPATIENT_CLINIC_OR_DEPARTMENT_OTHER): Payer: Federal, State, Local not specified - PPO

## 2022-04-03 ENCOUNTER — Other Ambulatory Visit (INDEPENDENT_AMBULATORY_CARE_PROVIDER_SITE_OTHER): Payer: Federal, State, Local not specified - PPO

## 2022-04-03 ENCOUNTER — Telehealth: Payer: Self-pay

## 2022-04-03 DIAGNOSIS — E785 Hyperlipidemia, unspecified: Secondary | ICD-10-CM

## 2022-04-03 DIAGNOSIS — M5416 Radiculopathy, lumbar region: Secondary | ICD-10-CM | POA: Diagnosis not present

## 2022-04-03 DIAGNOSIS — G959 Disease of spinal cord, unspecified: Secondary | ICD-10-CM | POA: Diagnosis not present

## 2022-04-03 DIAGNOSIS — M79606 Pain in leg, unspecified: Secondary | ICD-10-CM | POA: Diagnosis not present

## 2022-04-03 DIAGNOSIS — M4316 Spondylolisthesis, lumbar region: Secondary | ICD-10-CM | POA: Diagnosis not present

## 2022-04-03 DIAGNOSIS — M5126 Other intervertebral disc displacement, lumbar region: Secondary | ICD-10-CM | POA: Diagnosis not present

## 2022-04-03 DIAGNOSIS — E673 Hypervitaminosis D: Secondary | ICD-10-CM

## 2022-04-03 LAB — VITAMIN D 25 HYDROXY (VIT D DEFICIENCY, FRACTURES): VITD: >120 ng/mL

## 2022-04-03 LAB — TSH: TSH: 0.33 u[IU]/mL — ABNORMAL LOW (ref 0.35–5.50)

## 2022-04-03 NOTE — Telephone Encounter (Signed)
Pt called back and was informed of results, lab only visit tomorrow

## 2022-04-03 NOTE — Telephone Encounter (Signed)
Labs ordered.

## 2022-04-03 NOTE — Telephone Encounter (Signed)
Received phone call from the lab in regards to a critical value for this patient. Her Vitamin D is greater than 120. Dr Birdie Riddle was notified via teams as well.

## 2022-04-03 NOTE — Telephone Encounter (Signed)
Lab value addressed in result note

## 2022-04-03 NOTE — Telephone Encounter (Signed)
-----   Message from Midge Minium, MD sent at 04/03/2022  2:51 PM EDT ----- TSH is closer to normal range and no adjustments need to be made.  Your Vit D remains very high.  I know we talked about stopping any Vit D containing vitamins or supplements.  At this time I want to check your parathyroid hormone (PTH and Calcium- dx elevated Vit D) at a lab only visit to ensure there is not some form of metabolic bone disease (which could also cause bone pain).  Please schedule this visit at your earliest convenience

## 2022-04-04 ENCOUNTER — Other Ambulatory Visit: Payer: Federal, State, Local not specified - PPO

## 2022-04-04 DIAGNOSIS — E673 Hypervitaminosis D: Secondary | ICD-10-CM | POA: Diagnosis not present

## 2022-04-07 LAB — PTH, INTACT AND CALCIUM: PTH: 31 pg/mL (ref 16–77)

## 2022-04-08 ENCOUNTER — Other Ambulatory Visit: Payer: Self-pay

## 2022-04-08 ENCOUNTER — Other Ambulatory Visit (HOSPITAL_BASED_OUTPATIENT_CLINIC_OR_DEPARTMENT_OTHER): Payer: Federal, State, Local not specified - PPO

## 2022-04-08 DIAGNOSIS — E673 Hypervitaminosis D: Secondary | ICD-10-CM

## 2022-04-08 NOTE — Progress Notes (Signed)
Informed pt of lab results  

## 2022-04-08 NOTE — Addendum Note (Signed)
Addended by: Midge Minium on: 04/08/2022 10:11 AM   Modules accepted: Orders

## 2022-04-11 DIAGNOSIS — Z6827 Body mass index (BMI) 27.0-27.9, adult: Secondary | ICD-10-CM | POA: Diagnosis not present

## 2022-04-11 DIAGNOSIS — M4316 Spondylolisthesis, lumbar region: Secondary | ICD-10-CM | POA: Diagnosis not present

## 2022-04-14 ENCOUNTER — Telehealth: Payer: Self-pay | Admitting: Family Medicine

## 2022-04-14 ENCOUNTER — Other Ambulatory Visit: Payer: Self-pay

## 2022-04-14 ENCOUNTER — Other Ambulatory Visit (INDEPENDENT_AMBULATORY_CARE_PROVIDER_SITE_OTHER): Payer: Federal, State, Local not specified - PPO

## 2022-04-14 DIAGNOSIS — E673 Hypervitaminosis D: Secondary | ICD-10-CM | POA: Diagnosis not present

## 2022-04-14 LAB — VITAMIN D 25 HYDROXY (VIT D DEFICIENCY, FRACTURES): VITD: >120 ng/mL

## 2022-04-14 NOTE — Progress Notes (Signed)
Spoke to the pt and advised to increase water , green leafy veggies and stop all calcium supplements . Referral to Endodontology has been placed as well

## 2022-04-14 NOTE — Telephone Encounter (Signed)
Responded to via result note

## 2022-04-14 NOTE — Telephone Encounter (Signed)
Lab called with critical, vitamin D greater than 120

## 2022-04-15 ENCOUNTER — Telehealth (INDEPENDENT_AMBULATORY_CARE_PROVIDER_SITE_OTHER): Payer: Federal, State, Local not specified - PPO | Admitting: Family Medicine

## 2022-04-15 ENCOUNTER — Telehealth: Payer: Self-pay | Admitting: Family Medicine

## 2022-04-15 DIAGNOSIS — R110 Nausea: Secondary | ICD-10-CM

## 2022-04-15 DIAGNOSIS — R195 Other fecal abnormalities: Secondary | ICD-10-CM | POA: Diagnosis not present

## 2022-04-15 MED ORDER — ONDANSETRON HCL 4 MG PO TABS: 4.0000 mg | ORAL_TABLET | Freq: Three times a day (TID) | ORAL | 0 refills | Status: DC | PRN

## 2022-04-15 NOTE — Telephone Encounter (Signed)
Pt chose to go ahead and schedule a mychart video visit instead.
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
## 2022-04-15 NOTE — Patient Instructions (Signed)
-  I sent the medication(s) we discussed to your pharmacy: Meds ordered this encounter  Medications   ondansetron (ZOFRAN) 4 MG tablet    Sig: Take 1 tablet (4 mg total) by mouth every 8 (eight) hours as needed for nausea or vomiting.    Dispense:  10 tablet    Refill:  0   No dairy for 1 week  Stay hydrated  I hope you are feeling better soon!  Seek in person care promptly if your symptoms worsen, new concerns arise or you are not improving with treatment over the next 24 hours.  It was nice to meet you today. I help [facility] out with telemedicine visits on Tuesdays and Thursdays and am happy to help if you need a virtual follow up visit on those days. Otherwise, if you have any concerns or questions following this visit please schedule a follow up visit with your Primary Care office or seek care at a local urgent care clinic to avoid delays in care. If you are having severe or life threatening symptoms please call 911 and/or go to the nearest emergency room.

## 2022-04-18 ENCOUNTER — Other Ambulatory Visit: Payer: Self-pay

## 2022-04-18 MED ORDER — VALACYCLOVIR HCL 500 MG PO TABS: 500.0000 mg | ORAL_TABLET | Freq: Every day | ORAL | 6 refills | Status: DC | PRN

## 2022-04-22 ENCOUNTER — Ambulatory Visit (HOSPITAL_BASED_OUTPATIENT_CLINIC_OR_DEPARTMENT_OTHER)
Admission: RE | Admit: 2022-04-22 | Discharge: 2022-04-22 | Disposition: A | Discharge status: Home / Self Care | Payer: Federal, State, Local not specified - PPO | Source: Ambulatory Visit | Attending: Family Medicine | Admitting: Family Medicine

## 2022-04-22 DIAGNOSIS — M8580 Other specified disorders of bone density and structure, unspecified site: Secondary | ICD-10-CM | POA: Insufficient documentation

## 2022-04-22 DIAGNOSIS — Z780 Asymptomatic menopausal state: Secondary | ICD-10-CM | POA: Diagnosis not present

## 2022-04-22 DIAGNOSIS — M8589 Other specified disorders of bone density and structure, multiple sites: Secondary | ICD-10-CM | POA: Diagnosis not present

## 2022-04-23 ENCOUNTER — Telehealth: Payer: Self-pay | Admitting: Family Medicine

## 2022-04-23 ENCOUNTER — Telehealth: Payer: Self-pay

## 2022-04-23 NOTE — Telephone Encounter (Signed)
I am so sorry for the confusion!  The response to take Ca and Vit D is a smart phrase I use when someone has osteopenia.  But she is correct that given her very high Vit D levels she needs to be off both of these medications for now.  NO Vit D or Calcium supplements at this time

## 2022-04-23 NOTE — Telephone Encounter (Signed)
Referral was faxed again to [phone]

## 2022-04-23 NOTE — Telephone Encounter (Signed)
Called patient to inform her this has been refaxed she notes they called her back and did get scheduled for January.

## 2022-04-23 NOTE — Telephone Encounter (Signed)
Caller name: Saranda Legrande  On DPR? :yes/no: Yes  Call back number: [phone]  Provider they see: Birdie Riddle   Reason for call:  pt called stating that she called Dr.Farmer's office to schedule her appt. They didn't received pt referral. Please fax referral to [phone]

## 2022-04-23 NOTE — Telephone Encounter (Signed)
Pt had requested clarification, notes the labs from 04/14/22 noted stop vitamin and calcium but bone density from 04/22/22 notes the Continue both of those, patient is unsure what to do at this time   Please advise

## 2022-04-23 NOTE — Telephone Encounter (Signed)
Called and notified patient and she was grateful for the clarification she has not been taking either since labs were initially elevated in August

## 2022-04-24 ENCOUNTER — Encounter: Payer: Self-pay | Admitting: *Deleted

## 2022-04-29 ENCOUNTER — Ambulatory Visit: Payer: Federal, State, Local not specified - PPO | Admitting: Diagnostic Neuroimaging

## 2022-04-29 ENCOUNTER — Telehealth: Payer: Self-pay | Admitting: Diagnostic Neuroimaging

## 2022-04-29 ENCOUNTER — Encounter: Payer: Self-pay | Admitting: Diagnostic Neuroimaging

## 2022-04-29 VITALS — BP 126/79 | HR 99 | Ht 61.5 in | Wt 148.0 lb

## 2022-04-29 DIAGNOSIS — R29898 Other symptoms and signs involving the musculoskeletal system: Secondary | ICD-10-CM

## 2022-04-29 NOTE — Telephone Encounter (Signed)
[employer] federal NPR sent to GI [phone] 
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
## 2022-05-16 ENCOUNTER — Ambulatory Visit
Admission: RE | Admit: 2022-05-16 | Discharge: 2022-05-16 | Disposition: A | Discharge status: Home / Self Care | Payer: Federal, State, Local not specified - PPO | Source: Ambulatory Visit | Attending: Diagnostic Neuroimaging | Admitting: Diagnostic Neuroimaging

## 2022-05-16 DIAGNOSIS — R29898 Other symptoms and signs involving the musculoskeletal system: Secondary | ICD-10-CM | POA: Diagnosis not present

## 2022-05-16 MED ORDER — GADOPICLENOL 0.5 MMOL/ML IV SOLN
7.0000 mL | Freq: Once | INTRAVENOUS | Status: AC | PRN
  Administered 2022-05-16: 7 mL via INTRAVENOUS

## 2022-05-21 DIAGNOSIS — K080 Exfoliation of teeth due to systemic causes: Secondary | ICD-10-CM | POA: Diagnosis not present

## 2022-05-22 DIAGNOSIS — D132 Benign neoplasm of duodenum: Secondary | ICD-10-CM | POA: Diagnosis not present

## 2022-05-23 ENCOUNTER — Other Ambulatory Visit: Payer: Self-pay | Admitting: Family Medicine

## 2022-05-23 DIAGNOSIS — F419 Anxiety disorder, unspecified: Secondary | ICD-10-CM

## 2022-05-23 NOTE — Telephone Encounter (Signed)
Xanax 0.5 mg LOV: 03/27/22 Last Refill:03/24/22 Upcoming appt: 09/25/22
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
## 2022-06-27 ENCOUNTER — Ambulatory Visit (INDEPENDENT_AMBULATORY_CARE_PROVIDER_SITE_OTHER): Payer: Federal, State, Local not specified - PPO

## 2022-06-27 ENCOUNTER — Ambulatory Visit: Payer: Federal, State, Local not specified - PPO

## 2022-06-27 ENCOUNTER — Ambulatory Visit: Payer: Federal, State, Local not specified - PPO | Attending: Rheumatology | Admitting: Rheumatology

## 2022-06-27 ENCOUNTER — Encounter: Payer: Self-pay | Admitting: Rheumatology

## 2022-06-27 VITALS — BP 126/79 | HR 96 | Resp 15 | Ht 62.5 in | Wt 142.0 lb

## 2022-06-27 DIAGNOSIS — G8929 Other chronic pain: Secondary | ICD-10-CM | POA: Diagnosis not present

## 2022-06-27 DIAGNOSIS — M533 Sacrococcygeal disorders, not elsewhere classified: Secondary | ICD-10-CM

## 2022-06-27 DIAGNOSIS — F419 Anxiety disorder, unspecified: Secondary | ICD-10-CM

## 2022-06-27 DIAGNOSIS — M25562 Pain in left knee: Secondary | ICD-10-CM

## 2022-06-27 DIAGNOSIS — M8589 Other specified disorders of bone density and structure, multiple sites: Secondary | ICD-10-CM

## 2022-06-27 DIAGNOSIS — R768 Other specified abnormal immunological findings in serum: Secondary | ICD-10-CM

## 2022-06-27 DIAGNOSIS — F32A Depression, unspecified: Secondary | ICD-10-CM

## 2022-06-27 DIAGNOSIS — M25561 Pain in right knee: Secondary | ICD-10-CM

## 2022-06-27 DIAGNOSIS — R5383 Other fatigue: Secondary | ICD-10-CM | POA: Diagnosis not present

## 2022-06-27 DIAGNOSIS — M25551 Pain in right hip: Secondary | ICD-10-CM

## 2022-06-27 DIAGNOSIS — M722 Plantar fascial fibromatosis: Secondary | ICD-10-CM

## 2022-06-27 DIAGNOSIS — E890 Postprocedural hypothyroidism: Secondary | ICD-10-CM

## 2022-06-27 DIAGNOSIS — Z853 Personal history of malignant neoplasm of breast: Secondary | ICD-10-CM

## 2022-06-27 DIAGNOSIS — M25512 Pain in left shoulder: Secondary | ICD-10-CM

## 2022-06-27 DIAGNOSIS — Z8261 Family history of arthritis: Secondary | ICD-10-CM

## 2022-06-27 DIAGNOSIS — K2990 Gastroduodenitis, unspecified, without bleeding: Secondary | ICD-10-CM

## 2022-06-27 DIAGNOSIS — M79642 Pain in left hand: Secondary | ICD-10-CM

## 2022-06-27 DIAGNOSIS — K2970 Gastritis, unspecified, without bleeding: Secondary | ICD-10-CM

## 2022-06-27 DIAGNOSIS — Z8639 Personal history of other endocrine, nutritional and metabolic disease: Secondary | ICD-10-CM

## 2022-06-27 DIAGNOSIS — G629 Polyneuropathy, unspecified: Secondary | ICD-10-CM

## 2022-06-27 DIAGNOSIS — M51369 Other intervertebral disc degeneration, lumbar region without mention of lumbar back pain or lower extremity pain: Secondary | ICD-10-CM

## 2022-06-27 DIAGNOSIS — M79641 Pain in right hand: Secondary | ICD-10-CM

## 2022-06-27 DIAGNOSIS — M25572 Pain in left ankle and joints of left foot: Secondary | ICD-10-CM | POA: Diagnosis not present

## 2022-06-27 DIAGNOSIS — M5136 Other intervertebral disc degeneration, lumbar region: Secondary | ICD-10-CM

## 2022-06-27 DIAGNOSIS — D132 Benign neoplasm of duodenum: Secondary | ICD-10-CM

## 2022-06-27 DIAGNOSIS — Z840 Family history of diseases of the skin and subcutaneous tissue: Secondary | ICD-10-CM

## 2022-06-27 DIAGNOSIS — R7689 Other specified abnormal immunological findings in serum: Secondary | ICD-10-CM

## 2022-06-27 DIAGNOSIS — M898X9 Other specified disorders of bone, unspecified site: Secondary | ICD-10-CM

## 2022-06-27 DIAGNOSIS — Z8669 Personal history of other diseases of the nervous system and sense organs: Secondary | ICD-10-CM

## 2022-06-27 DIAGNOSIS — M4316 Spondylolisthesis, lumbar region: Secondary | ICD-10-CM

## 2022-06-29 LAB — ANTI-NUCLEAR AB-TITER (ANA TITER): ANA Titer 1: 1:80 {titer} — ABNORMAL HIGH

## 2022-06-29 LAB — ANA: Anti Nuclear Antibody (ANA): POSITIVE — AB

## 2022-06-29 LAB — SJOGRENS SYNDROME-A EXTRACTABLE NUCLEAR ANTIBODY: SSA (Ro) (ENA) Antibody, IgG: <1 AI

## 2022-06-29 LAB — SEDIMENTATION RATE: Sed Rate: 11 mm/h (ref 0–30)

## 2022-06-29 LAB — RNP ANTIBODY: Ribonucleic Protein(ENA) Antibody, IgG: 1 AI — AB

## 2022-06-29 LAB — ANTI-DNA ANTIBODY, DOUBLE-STRANDED: ds DNA Ab: <1 IU/mL

## 2022-06-29 LAB — C3 AND C4
C3 Complement: 161 mg/dL (ref 83–193)
C4 Complement: 37 mg/dL (ref 15–57)

## 2022-06-29 LAB — CYCLIC CITRUL PEPTIDE ANTIBODY, IGG: Cyclic Citrullin Peptide Ab: <16 UNITS

## 2022-06-29 LAB — ANTI-SCLERODERMA ANTIBODY: Scleroderma (Scl-70) (ENA) Antibody, IgG: <1 AI

## 2022-06-29 LAB — HLA-B27 ANTIGEN: HLA-B27 Antigen: NEGATIVE

## 2022-06-29 LAB — CK: Total CK: 75 U/L (ref 29–143)

## 2022-06-29 LAB — SJOGRENS SYNDROME-B EXTRACTABLE NUCLEAR ANTIBODY: SSB (La) (ENA) Antibody, IgG: <1 AI

## 2022-06-29 LAB — ANTI-SMITH ANTIBODY: ENA SM Ab Ser-aCnc: <1 AI
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
## 2022-07-14 DIAGNOSIS — K080 Exfoliation of teeth due to systemic causes: Secondary | ICD-10-CM | POA: Diagnosis not present

## 2022-07-15 ENCOUNTER — Encounter: Payer: Self-pay | Admitting: Rheumatology

## 2022-07-15 ENCOUNTER — Ambulatory Visit: Payer: Federal, State, Local not specified - PPO | Attending: Rheumatology | Admitting: Rheumatology

## 2022-07-15 VITALS — BP 121/78 | HR 87 | Resp 15 | Ht 62.5 in | Wt 142.2 lb

## 2022-07-15 DIAGNOSIS — M25572 Pain in left ankle and joints of left foot: Secondary | ICD-10-CM

## 2022-07-15 DIAGNOSIS — Z853 Personal history of malignant neoplasm of breast: Secondary | ICD-10-CM

## 2022-07-15 DIAGNOSIS — R768 Other specified abnormal immunological findings in serum: Secondary | ICD-10-CM

## 2022-07-15 DIAGNOSIS — M170 Bilateral primary osteoarthritis of knee: Secondary | ICD-10-CM

## 2022-07-15 DIAGNOSIS — R5383 Other fatigue: Secondary | ICD-10-CM

## 2022-07-15 DIAGNOSIS — M5136 Other intervertebral disc degeneration, lumbar region: Secondary | ICD-10-CM

## 2022-07-15 DIAGNOSIS — M8589 Other specified disorders of bone density and structure, multiple sites: Secondary | ICD-10-CM

## 2022-07-15 DIAGNOSIS — M25512 Pain in left shoulder: Secondary | ICD-10-CM

## 2022-07-15 DIAGNOSIS — E890 Postprocedural hypothyroidism: Secondary | ICD-10-CM

## 2022-07-15 DIAGNOSIS — F419 Anxiety disorder, unspecified: Secondary | ICD-10-CM

## 2022-07-15 DIAGNOSIS — M19042 Primary osteoarthritis, left hand: Secondary | ICD-10-CM

## 2022-07-15 DIAGNOSIS — Z8639 Personal history of other endocrine, nutritional and metabolic disease: Secondary | ICD-10-CM

## 2022-07-15 DIAGNOSIS — F32A Depression, unspecified: Secondary | ICD-10-CM

## 2022-07-15 DIAGNOSIS — Z8261 Family history of arthritis: Secondary | ICD-10-CM

## 2022-07-15 DIAGNOSIS — D132 Benign neoplasm of duodenum: Secondary | ICD-10-CM

## 2022-07-15 DIAGNOSIS — Z840 Family history of diseases of the skin and subcutaneous tissue: Secondary | ICD-10-CM

## 2022-07-15 DIAGNOSIS — M19041 Primary osteoarthritis, right hand: Secondary | ICD-10-CM | POA: Diagnosis not present

## 2022-07-15 DIAGNOSIS — K2970 Gastritis, unspecified, without bleeding: Secondary | ICD-10-CM

## 2022-07-15 DIAGNOSIS — M25551 Pain in right hip: Secondary | ICD-10-CM | POA: Diagnosis not present

## 2022-07-15 DIAGNOSIS — K2990 Gastroduodenitis, unspecified, without bleeding: Secondary | ICD-10-CM

## 2022-07-15 DIAGNOSIS — G629 Polyneuropathy, unspecified: Secondary | ICD-10-CM

## 2022-07-15 DIAGNOSIS — M533 Sacrococcygeal disorders, not elsewhere classified: Secondary | ICD-10-CM

## 2022-07-15 DIAGNOSIS — M722 Plantar fascial fibromatosis: Secondary | ICD-10-CM

## 2022-07-15 DIAGNOSIS — G8929 Other chronic pain: Secondary | ICD-10-CM

## 2022-07-15 DIAGNOSIS — Z8669 Personal history of other diseases of the nervous system and sense organs: Secondary | ICD-10-CM

## 2022-07-15 NOTE — Patient Instructions (Signed)
Hand Exercises Hand exercises can be helpful for almost anyone. These exercises can strengthen the hands, improve flexibility and movement, and increase blood flow to the hands. These results can make work and daily tasks easier. Hand exercises can be especially helpful for people who have joint pain from arthritis or have nerve damage from overuse (carpal tunnel syndrome). These exercises can also help people who have injured a hand. Exercises Most of these hand exercises are gentle stretching and motion exercises. It is usually safe to do them often throughout the day. Warming up your hands before exercise may help to reduce stiffness. You can do this with gentle massage or by placing your hands in warm water for 10-15 minutes. It is normal to feel some stretching, pulling, tightness, or mild discomfort as you begin new exercises. This will gradually improve. Stop an exercise right away if you feel sudden, severe pain or your pain gets worse. Ask your health care provider which exercises are best for you. Knuckle bend or "claw" fist  Stand or sit with your arm, hand, and all five fingers pointed straight up. Make sure to keep your wrist straight during the exercise. Gently bend your fingers down toward your palm until the tips of your fingers are touching the top of your palm. Keep your big knuckle straight and just bend the small knuckles in your fingers. Hold this position for __________ seconds. Straighten (extend) your fingers back to the starting position. Repeat this exercise 5-10 times with each hand. Full finger fist  Stand or sit with your arm, hand, and all five fingers pointed straight up. Make sure to keep your wrist straight during the exercise. Gently bend your fingers into your palm until the tips of your fingers are touching the middle of your palm. Hold this position for __________ seconds. Extend your fingers back to the starting position, stretching every joint fully. Repeat  this exercise 5-10 times with each hand. Straight fist Stand or sit with your arm, hand, and all five fingers pointed straight up. Make sure to keep your wrist straight during the exercise. Gently bend your fingers at the big knuckle, where your fingers meet your hand, and the middle knuckle. Keep the knuckle at the tips of your fingers straight and try to touch the bottom of your palm. Hold this position for __________ seconds. Extend your fingers back to the starting position, stretching every joint fully. Repeat this exercise 5-10 times with each hand. Tabletop  Stand or sit with your arm, hand, and all five fingers pointed straight up. Make sure to keep your wrist straight during the exercise. Gently bend your fingers at the big knuckle, where your fingers meet your hand, as far down as you can while keeping the small knuckles in your fingers straight. Think of forming a tabletop with your fingers. Hold this position for __________ seconds. Extend your fingers back to the starting position, stretching every joint fully. Repeat this exercise 5-10 times with each hand. Finger spread  Place your hand flat on a table with your palm facing down. Make sure your wrist stays straight as you do this exercise. Spread your fingers and thumb apart from each other as far as you can until you feel a gentle stretch. Hold this position for __________ seconds. Bring your fingers and thumb tight together again. Hold this position for __________ seconds. Repeat this exercise 5-10 times with each hand. Making circles  Stand or sit with your arm, hand, and all five fingers pointed   straight up. Make sure to keep your wrist straight during the exercise. Make a circle by touching the tip of your thumb to the tip of your index finger. Hold for __________ seconds. Then open your hand wide. Repeat this motion with your thumb and each finger on your hand. Repeat this exercise 5-10 times with each hand. Thumb  motion  Sit with your forearm resting on a table and your wrist straight. Your thumb should be facing up toward the ceiling. Keep your fingers relaxed as you move your thumb. Lift your thumb up as high as you can toward the ceiling. Hold for __________ seconds. Bend your thumb across your palm as far as you can, reaching the tip of your thumb for the small finger (pinkie) side of your palm. Hold for __________ seconds. Repeat this exercise 5-10 times with each hand. Grip strengthening  Hold a stress ball or other soft ball in the middle of your hand. Slowly increase the pressure, squeezing the ball as much as you can without causing pain. Think of bringing the tips of your fingers into the middle of your palm. All of your finger joints should bend when doing this exercise. Hold your squeeze for __________ seconds, then relax. Repeat this exercise 5-10 times with each hand. Contact a health care provider if: Your hand pain or discomfort gets much worse when you do an exercise. Your hand pain or discomfort does not improve within 2 hours after you exercise. If you have any of these problems, stop doing these exercises right away. Do not do them again unless your health care provider says that you can. Get help right away if: You develop sudden, severe hand pain or swelling. If this happens, stop doing these exercises right away. Do not do them again unless your health care provider says that you can. This information is not intended to replace advice given to you by your health care provider. Make sure you discuss any questions you have with your health care provider. Document Revised: 10/11/2020 Document Reviewed: 10/11/2020 [employer] Patient Education  2023 [employer] Exercises for Chronic Knee Pain Chronic knee pain is pain that lasts longer than 3 months. For most people with chronic knee pain, exercise and weight loss is an important part of treatment. Your health care provider may want  you to focus on: Strengthening the muscles that support your knee. This can take pressure off your knee and lessen pain. Preventing knee stiffness. Maintaining or increasing how far you can move your knee. Losing weight (if this applies) to take pressure off your knee, decrease your risk for injury, and make it easier for you to exercise. Your health care provider will help you develop an exercise program that matches your needs and physical abilities. Below are simple, low-impact exercises you can do at home. Ask your health care provider or a physical therapist how often you should do your exercise program and how many times to repeat each exercise. General safety tips Follow these safety tips for exercising with chronic knee pain: Get your health care provider's approval before doing any exercises. Start slowly and stop any time an exercise causes pain. Do not exercise if your knee pain is flaring up. Warm up first. Stretching a cold muscle can cause an injury. Do 5-10 minutes of easy movement or light stretching before beginning your exercise routine. Do 5-10 minutes of low-impact activity (like walking or cycling) before starting strengthening exercises. Contact your health care provider any time you have pain   during or after exercising. Exercise may cause discomfort but should not be painful. It is normal to be a little stiff or sore after exercising.  Stretching and range-of-motion exercises Front thigh stretch  Stand up straight and support your body by holding on to a chair or resting one hand on a wall. With your legs straight and close together, bend one knee to lift your heel up toward your buttocks. Using one hand for support, grab your ankle with your free hand. Pull your foot up closer toward your buttocks to feel the stretch in front of your thigh. Hold the stretch for 30 seconds. Repeat __________ times. Complete this exercise __________ times a day. Back thigh stretch  Sit  on the floor with your back straight and your legs out straight in front of you. Place the palms of your hands on the floor and slide them toward your feet as you bend at the hip. Try to touch your nose to your knees and feel the stretch in the back of your thighs. Hold for 30 seconds. Repeat __________ times. Complete this exercise __________ times a day. Calf stretch  Stand facing a wall. Place the palms of your hands flat against the wall, arms extended, and lean slightly against the wall. Get into a lunge position with one leg bent at the knee and the other leg stretched out straight behind you. Keep both feet facing the wall and increase the bend in your knee while keeping the heel of the other leg flat on the ground. You should feel the stretch in your calf. Hold for 30 seconds. Repeat __________ times. Complete this exercise __________ times a day. Strengthening exercises Straight leg lift Lie on your back with one knee bent and the other leg out straight. Slowly lift the straight leg without bending the knee. Lift until your foot is about 12 inches (30 cm) off the floor. Hold for 3-5 seconds and slowly lower your leg. Repeat __________ times. Complete this exercise __________ times a day. Single leg dip Stand between two chairs and put both hands on the backs of the chairs for support. Extend one leg out straight with your body weight resting on the heel of the standing leg. Slowly bend your standing knee to dip your body to the level that is comfortable for you. Hold for 3-5 seconds. Repeat __________ times. Complete this exercise __________ times a day. Hamstring curls Stand straight, knees close together, facing the back of a chair. Hold on to the back of a chair with both hands. Keep one leg straight. Bend the other knee while bringing the heel up toward the buttock until the knee is bent at a 90-degree angle (right angle). Hold for 3-5 seconds. Repeat __________ times.  Complete this exercise __________ times a day. Wall squat Stand straight with your back, hips, and head against a wall. Step forward one foot at a time with your back still against the wall. Your feet should be 2 feet (61 cm) from the wall at shoulder width. Keeping your back, hips, and head against the wall, slide down the wall to as close of a sitting position as you can get. Hold for 5-10 seconds, then slowly slide back up. Repeat __________ times. Complete this exercise __________ times a day. Step-ups Step up with one foot onto a sturdy platform or stool that is about 6 inches (15 cm) high. Face sideways with one foot on the platform and one on the ground. Place all your weight on  the platform foot and lift your body off the ground until your knee extends. Let your other leg hang free to the side. Hold for 3-5 seconds then slowly lower your weight down to the floor foot. Repeat __________ times. Complete this exercise __________ times a day. Contact a health care provider if: Your exercise causes pain. Your pain is worse after you exercise. Your pain prevents you from doing your exercises. This information is not intended to replace advice given to you by your health care provider. Make sure you discuss any questions you have with your health care provider. Document Revised: 10/27/2019 Document Reviewed: 06/20/2019 Elsevier Patient Education  [place] [facility] Ask your health care provider which exercises are safe for you. Do exercises exactly as told by your health care provider and adjust them as directed. It is normal to feel mild stretching, pulling, tightness, or discomfort as you do these exercises. Stop right away if you feel sudden pain or your pain gets significantly worse. Do not begin these exercises until told by your health care provider. Stretching and range-of-motion exercises These exercises warm up your muscles and joints and improve the  movement and flexibility of your hip and pelvis. Quadriceps stretch, prone  Lie on your abdomen (prone position) on a firm surface, such as a bed or padded floor. Bend your left / right knee and reach back to hold your ankle or pant leg. If you cannot reach your ankle or pant leg, loop a belt around your foot and grab the belt instead. Gently pull your heel toward your buttocks. Your knee should not slide out to the side. You should feel a stretch in the front of your thigh and knee (quadriceps). Hold this position for __________ seconds. Repeat __________ times. Complete this exercise __________ times a day. Iliotibial band stretch An iliotibial band is a strong band of muscle tissue that runs from the outer side of your hip to the outer side of your thigh and knee. Lie on your side with your left / right leg in the top position. Bend both of your knees and grab your left / right ankle. Stretch out your bottom arm to help you balance. Slowly bring your top knee back so your thigh goes behind your trunk. Slowly lower your top leg toward the floor until you feel a gentle stretch on the outside of your left / right hip and thigh. If you do not feel a stretch and your knee will not fall farther, place the heel of your other foot on top of your knee and pull your knee down toward the floor with your foot. Hold this position for __________ seconds. Repeat __________ times. Complete this exercise __________ times a day. Strengthening exercises These exercises build strength and endurance in your hip and pelvis. Endurance is the ability to use your muscles for a long time, even after they get tired. Straight leg raises, side-lying This exercise strengthens the muscles that rotate the leg at the hip and move it away from your body (hip abductors). Lie on your side with your left / right leg in the top position. Lie so your head, shoulder, hip, and knee line up. You may bend your bottom knee to help you  balance. Roll your hips slightly forward so your hips are stacked directly over each other and your left / right knee is facing forward. Tense the muscles in your outer thigh and lift your top leg 4-6 inches (10-15 cm). Hold  this position for __________ seconds. Slowly lower your leg to return to the starting position. Let your muscles relax completely before doing another repetition. Repeat __________ times. Complete this exercise __________ times a day. Leg raises, prone This exercise strengthens the muscles that move the hips backward (hip extensors). Lie on your abdomen (prone position) on your bed or a firm surface. You can put a pillow under your hips if that is more comfortable for your lower back. Bend your left / right knee so your foot is straight up in the air. Squeeze your buttocks muscles and lift your left / right thigh off the bed. Do not let your back arch. Tense your thigh muscle as hard as you can without increasing any knee pain. Hold this position for __________ seconds. Slowly lower your leg to return to the starting position and allow it to relax completely. Repeat __________ times. Complete this exercise __________ times a day. Hip hike Stand sideways on a bottom step. Stand on your left / right leg with your other foot unsupported next to the step. You can hold on to a railing or wall for balance if needed. Keep your knees straight and your torso square. Then lift your left / right hip up toward the ceiling. Slowly let your left / right hip lower toward the floor, past the starting position. Your foot should get closer to the floor. Do not lean or bend your knees. Repeat __________ times. Complete this exercise __________ times a day. This information is not intended to replace advice given to you by your health care provider. Make sure you discuss any questions you have with your health care provider. Document Revised: 08/31/2019 Document Reviewed: 08/31/2019 Elsevier  Patient Education  [place].

## 2022-07-23 DIAGNOSIS — J019 Acute sinusitis, unspecified: Secondary | ICD-10-CM | POA: Diagnosis not present

## 2022-07-23 DIAGNOSIS — B349 Viral infection, unspecified: Secondary | ICD-10-CM | POA: Diagnosis not present

## 2022-07-24 ENCOUNTER — Other Ambulatory Visit: Payer: Self-pay | Admitting: Family Medicine

## 2022-07-24 DIAGNOSIS — F32A Depression, unspecified: Secondary | ICD-10-CM

## 2022-07-24 NOTE — Telephone Encounter (Signed)
Notified pt Rx has been sent in  

## 2022-07-24 NOTE — Telephone Encounter (Signed)
Xanax 0.5 mg LOV: 03/27/22 Last Refill:05/26/22 Upcoming appt: no apt

## 2022-08-05 DIAGNOSIS — M8589 Other specified disorders of bone density and structure, multiple sites: Secondary | ICD-10-CM | POA: Diagnosis not present

## 2022-08-05 DIAGNOSIS — E673 Hypervitaminosis D: Secondary | ICD-10-CM | POA: Diagnosis not present

## 2022-08-05 DIAGNOSIS — E890 Postprocedural hypothyroidism: Secondary | ICD-10-CM | POA: Diagnosis not present

## 2022-08-12 ENCOUNTER — Telehealth: Payer: Self-pay | Admitting: Family Medicine

## 2022-08-12 NOTE — Telephone Encounter (Signed)
Pt called stating she's under endocrinologist care should she still keep March 11 appt --advise

## 2022-08-12 NOTE — Telephone Encounter (Signed)
Advised pt that she should keep her apt with Dr Birdie Riddle on 09/25/22 for her f/u on thyroid and cholesterol

## 2022-08-15 DIAGNOSIS — R1013 Epigastric pain: Secondary | ICD-10-CM | POA: Diagnosis not present

## 2022-08-15 DIAGNOSIS — D132 Benign neoplasm of duodenum: Secondary | ICD-10-CM | POA: Diagnosis not present

## 2022-08-19 DIAGNOSIS — N281 Cyst of kidney, acquired: Secondary | ICD-10-CM | POA: Diagnosis not present

## 2022-08-19 DIAGNOSIS — Z9049 Acquired absence of other specified parts of digestive tract: Secondary | ICD-10-CM | POA: Diagnosis not present

## 2022-08-19 DIAGNOSIS — K5730 Diverticulosis of large intestine without perforation or abscess without bleeding: Secondary | ICD-10-CM | POA: Diagnosis not present

## 2022-08-19 DIAGNOSIS — R1013 Epigastric pain: Secondary | ICD-10-CM | POA: Diagnosis not present

## 2022-08-19 DIAGNOSIS — K838 Other specified diseases of biliary tract: Secondary | ICD-10-CM | POA: Diagnosis not present

## 2022-08-25 DIAGNOSIS — R1013 Epigastric pain: Secondary | ICD-10-CM | POA: Diagnosis not present

## 2022-08-25 DIAGNOSIS — D132 Benign neoplasm of duodenum: Secondary | ICD-10-CM | POA: Diagnosis not present

## 2022-09-22 ENCOUNTER — Other Ambulatory Visit: Payer: Self-pay | Admitting: Family Medicine

## 2022-09-22 DIAGNOSIS — F419 Anxiety disorder, unspecified: Secondary | ICD-10-CM

## 2022-09-22 NOTE — Telephone Encounter (Signed)
Xanax 0.5 mg LOV: 03/27/22 Last Refill:07/24/22 Upcoming appt: 09/25/22

## 2022-09-22 NOTE — Telephone Encounter (Signed)
Pt aware Rx has been sent in.  

## 2022-09-25 ENCOUNTER — Ambulatory Visit: Payer: Federal, State, Local not specified - PPO | Admitting: Family Medicine

## 2022-09-25 ENCOUNTER — Encounter: Payer: Self-pay | Admitting: Family Medicine

## 2022-09-25 VITALS — BP 108/62 | HR 83 | Temp 97.9°F | Resp 17 | Ht 62.5 in | Wt 140.4 lb

## 2022-09-25 DIAGNOSIS — F419 Anxiety disorder, unspecified: Secondary | ICD-10-CM | POA: Diagnosis not present

## 2022-09-25 DIAGNOSIS — F32A Depression, unspecified: Secondary | ICD-10-CM

## 2022-09-25 DIAGNOSIS — E785 Hyperlipidemia, unspecified: Secondary | ICD-10-CM

## 2022-09-25 LAB — HEPATIC FUNCTION PANEL
ALT: 13 U/L (ref 0–35)
AST: 20 U/L (ref 0–37)
Albumin: 4.6 g/dL (ref 3.5–5.2)
Alkaline Phosphatase: 103 U/L (ref 39–117)
Bilirubin, Direct: 0.1 mg/dL (ref 0.0–0.3)
Total Bilirubin: 0.4 mg/dL (ref 0.2–1.2)
Total Protein: 7.6 g/dL (ref 6.0–8.3)

## 2022-09-25 LAB — BASIC METABOLIC PANEL
BUN: 12 mg/dL (ref 6–23)
CO2: 29 mEq/L (ref 19–32)
Calcium: 10 mg/dL (ref 8.4–10.5)
Chloride: 104 mEq/L (ref 96–112)
Creatinine, Ser: 0.71 mg/dL (ref 0.40–1.20)
GFR: 90.1 mL/min (ref >60.00)
Glucose, Bld: 81 mg/dL (ref 70–99)
Potassium: 4.4 mEq/L (ref 3.5–5.1)
Sodium: 140 mEq/L (ref 135–145)

## 2022-09-25 LAB — CBC WITH DIFFERENTIAL/PLATELET
Basophils Absolute: 0 10*3/uL (ref 0.0–0.1)
Basophils Relative: 0.3 % (ref 0.0–3.0)
Eosinophils Absolute: 0.2 10*3/uL (ref 0.0–0.7)
Eosinophils Relative: 3.4 % (ref 0.0–5.0)
HCT: 39.9 % (ref 36.0–46.0)
Hemoglobin: 13.2 g/dL (ref 12.0–15.0)
Lymphocytes Relative: 34.9 % (ref 12.0–46.0)
Lymphs Abs: 1.9 10*3/uL (ref 0.7–4.0)
MCHC: 33 g/dL (ref 30.0–36.0)
MCV: 87.9 fl (ref 78.0–100.0)
Monocytes Absolute: 0.4 10*3/uL (ref 0.1–1.0)
Monocytes Relative: 8.3 % (ref 3.0–12.0)
Neutro Abs: 2.9 10*3/uL (ref 1.4–7.7)
Neutrophils Relative %: 53.1 % (ref 43.0–77.0)
Platelets: 249 10*3/uL (ref 150.0–400.0)
RBC: 4.54 Mil/uL (ref 3.87–5.11)
RDW: 13.5 % (ref 11.5–15.5)
WBC: 5.4 10*3/uL (ref 4.0–10.5)

## 2022-09-25 LAB — LIPID PANEL
Cholesterol: 213 mg/dL — ABNORMAL HIGH (ref 0–200)
HDL: 69.5 mg/dL (ref >39.00)
LDL Cholesterol: 126 mg/dL — ABNORMAL HIGH (ref 0–99)
NonHDL: 143.13
Total CHOL/HDL Ratio: 3
Triglycerides: 87 mg/dL (ref 0.0–149.0)
VLDL: 17.4 mg/dL (ref 0.0–40.0)

## 2022-09-25 LAB — TSH: TSH: 0.71 u[IU]/mL (ref 0.35–5.50)

## 2022-09-25 NOTE — Assessment & Plan Note (Signed)
Ongoing issue.  Last LDL 123.  Trying to avoid medication and control w/ diet and exercise.  Weight is stable and looks great.  Check labs and start meds prn.

## 2022-09-25 NOTE — Patient Instructions (Signed)
Schedule your complete physical in 6 months We'll notify you of your lab results and make any changes if needed Keep up the good work!  You look great!! Call with any questions or concerns Stay Safe!  Stay Healthy! Happy [employer]!!

## 2022-09-25 NOTE — Progress Notes (Signed)
   Subjective:    Patient ID: [name] [name], female    DOB: 1960-12-15, 62 y.o.   MRN: [id number]  HPI Hyperlipidemia- last LDL 123.  Attempting to control w/ diet and exercise.  Denies CP, SOB, abd pain, N/V.  Anxiety/depression- ongoing issue for pt.  Pt recently lost her dog.  'i'm just trying to find my purpose'.  Currently on Wellbutrin 150mg  daily.  Pt reports she is redecorating her house to distract herself.  Pt is not interested in increasing Wellbutrin.  Is considering finding a new therapist.   Review of Systems For ROS see HPI     Objective:   Physical Exam Vitals reviewed.  Constitutional:      General: She is not in acute distress.    Appearance: Normal appearance. She is well-developed. She is not ill-appearing.  HENT:     Head: Normocephalic and atraumatic.  Eyes:     Conjunctiva/sclera: Conjunctivae normal.     Pupils: Pupils are equal, round, and reactive to light.  Neck:     Thyroid: No thyromegaly.  Cardiovascular:     Rate and Rhythm: Normal rate and regular rhythm.     Pulses: Normal pulses.     Heart sounds: Normal heart sounds. No murmur heard. Pulmonary:     Effort: Pulmonary effort is normal. No respiratory distress.     Breath sounds: Normal breath sounds.  Abdominal:     General: There is no distension.     Palpations: Abdomen is soft.     Tenderness: There is no abdominal tenderness.  Musculoskeletal:     Cervical back: Normal range of motion and neck supple.     Right lower leg: No edema.     Left lower leg: No edema.  Lymphadenopathy:     Cervical: No cervical adenopathy.  Skin:    General: Skin is warm and dry.  Neurological:     General: No focal deficit present.     Mental Status: She is alert and oriented to person, place, and time.  Psychiatric:        Behavior: Behavior normal.           Assessment & Plan:

## 2022-09-25 NOTE — Assessment & Plan Note (Signed)
Ongoing issue.  Not currently well controlled due to recent loss of her dog.  She is not interested in changing meds at this time and is reconsidering therapy.  Will follow.

## 2022-10-06 DIAGNOSIS — M79644 Pain in right finger(s): Secondary | ICD-10-CM | POA: Diagnosis not present

## 2022-10-06 DIAGNOSIS — M65331 Trigger finger, right middle finger: Secondary | ICD-10-CM | POA: Diagnosis not present

## 2022-11-11 DIAGNOSIS — M65331 Trigger finger, right middle finger: Secondary | ICD-10-CM | POA: Diagnosis not present

## 2022-11-14 DIAGNOSIS — M65331 Trigger finger, right middle finger: Secondary | ICD-10-CM | POA: Diagnosis not present

## 2022-11-14 HISTORY — PX: TRIGGER FINGER RELEASE: SHX641

## 2022-11-24 ENCOUNTER — Other Ambulatory Visit: Payer: Self-pay | Admitting: Family Medicine

## 2022-11-24 DIAGNOSIS — F32A Depression, unspecified: Secondary | ICD-10-CM

## 2022-11-24 NOTE — Telephone Encounter (Signed)
Xanax 0.5 mg LOV: 09/25/22 Last Refill:09/22/22 Upcoming appt: 03/30/23

## 2022-11-24 NOTE — Telephone Encounter (Signed)
Xanax 0.5 mg LOV: 09/25/22 Last Refill:09/22/22 Upcoming appt: 03/30/23 

## 2022-11-24 NOTE — Telephone Encounter (Signed)
Pt aware of refill.
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
## 2023-01-14 DIAGNOSIS — Z1151 Encounter for screening for human papillomavirus (HPV): Secondary | ICD-10-CM | POA: Diagnosis not present

## 2023-01-14 DIAGNOSIS — Z9882 Breast implant status: Secondary | ICD-10-CM | POA: Diagnosis not present

## 2023-01-14 DIAGNOSIS — Z01419 Encounter for gynecological examination (general) (routine) without abnormal findings: Secondary | ICD-10-CM | POA: Diagnosis not present

## 2023-01-14 DIAGNOSIS — N814 Uterovaginal prolapse, unspecified: Secondary | ICD-10-CM | POA: Insufficient documentation

## 2023-01-14 DIAGNOSIS — Z853 Personal history of malignant neoplasm of breast: Secondary | ICD-10-CM | POA: Diagnosis not present

## 2023-01-14 DIAGNOSIS — M7071 Other bursitis of hip, right hip: Secondary | ICD-10-CM | POA: Insufficient documentation

## 2023-01-14 DIAGNOSIS — Z124 Encounter for screening for malignant neoplasm of cervix: Secondary | ICD-10-CM | POA: Diagnosis not present

## 2023-01-14 LAB — HM PAP SMEAR

## 2023-01-22 ENCOUNTER — Ambulatory Visit: Payer: Federal, State, Local not specified - PPO | Attending: Rheumatology | Admitting: Rheumatology

## 2023-01-22 ENCOUNTER — Encounter: Payer: Self-pay | Admitting: Rheumatology

## 2023-01-22 VITALS — BP 114/79 | HR 93 | Resp 15 | Ht 62.5 in | Wt 144.6 lb

## 2023-01-22 DIAGNOSIS — M25572 Pain in left ankle and joints of left foot: Secondary | ICD-10-CM

## 2023-01-22 DIAGNOSIS — M19041 Primary osteoarthritis, right hand: Secondary | ICD-10-CM

## 2023-01-22 DIAGNOSIS — M170 Bilateral primary osteoarthritis of knee: Secondary | ICD-10-CM

## 2023-01-22 DIAGNOSIS — Z8669 Personal history of other diseases of the nervous system and sense organs: Secondary | ICD-10-CM

## 2023-01-22 DIAGNOSIS — M722 Plantar fascial fibromatosis: Secondary | ICD-10-CM

## 2023-01-22 DIAGNOSIS — R768 Other specified abnormal immunological findings in serum: Secondary | ICD-10-CM

## 2023-01-22 DIAGNOSIS — F419 Anxiety disorder, unspecified: Secondary | ICD-10-CM

## 2023-01-22 DIAGNOSIS — R5383 Other fatigue: Secondary | ICD-10-CM

## 2023-01-22 DIAGNOSIS — Z8639 Personal history of other endocrine, nutritional and metabolic disease: Secondary | ICD-10-CM

## 2023-01-22 DIAGNOSIS — Z853 Personal history of malignant neoplasm of breast: Secondary | ICD-10-CM

## 2023-01-22 DIAGNOSIS — E890 Postprocedural hypothyroidism: Secondary | ICD-10-CM

## 2023-01-22 DIAGNOSIS — M19042 Primary osteoarthritis, left hand: Secondary | ICD-10-CM

## 2023-01-22 DIAGNOSIS — Z8261 Family history of arthritis: Secondary | ICD-10-CM

## 2023-01-22 DIAGNOSIS — K2970 Gastritis, unspecified, without bleeding: Secondary | ICD-10-CM

## 2023-01-22 DIAGNOSIS — G8929 Other chronic pain: Secondary | ICD-10-CM

## 2023-01-22 DIAGNOSIS — M5136 Other intervertebral disc degeneration, lumbar region: Secondary | ICD-10-CM

## 2023-01-22 DIAGNOSIS — F32A Depression, unspecified: Secondary | ICD-10-CM

## 2023-01-22 DIAGNOSIS — G629 Polyneuropathy, unspecified: Secondary | ICD-10-CM

## 2023-01-22 DIAGNOSIS — M25551 Pain in right hip: Secondary | ICD-10-CM

## 2023-01-22 DIAGNOSIS — K2990 Gastroduodenitis, unspecified, without bleeding: Secondary | ICD-10-CM

## 2023-01-22 DIAGNOSIS — M8589 Other specified disorders of bone density and structure, multiple sites: Secondary | ICD-10-CM

## 2023-01-22 DIAGNOSIS — D132 Benign neoplasm of duodenum: Secondary | ICD-10-CM

## 2023-01-22 DIAGNOSIS — M533 Sacrococcygeal disorders, not elsewhere classified: Secondary | ICD-10-CM

## 2023-01-22 DIAGNOSIS — Z840 Family history of diseases of the skin and subcutaneous tissue: Secondary | ICD-10-CM

## 2023-01-22 NOTE — Patient Instructions (Signed)
Standing Labs We placed an order today for your standing lab work.   Please have your standing labs drawn in January  Please have your labs drawn 2 weeks prior to your appointment so that the provider can discuss your lab results at your appointment, if possible.  Please note that you may see your imaging and lab results in MyChart before we have reviewed them. We will contact you once all results are reviewed. Please allow our office up to 72 hours to thoroughly review all of the results before contacting the office for clarification of your results.  WALK-IN LAB HOURS  Monday through Thursday from 8:00 am -12:30 pm and 1:00 pm-5:00 pm and Friday from 8:00 am-12:00 pm.  Patients with office visits requiring labs will be seen before walk-in labs.  You may encounter longer than normal wait times. Please allow additional time. Wait times may be shorter on  Monday and Thursday afternoons.  We do not book appointments for walk-in labs. We appreciate your patience and understanding with our staff.   Labs are drawn by [facility]. Please bring your co-pay at the time of your lab draw.  You may receive a bill from [facility] for your lab work.  Please note if you are on Hydroxychloroquine and and an order has been placed for a Hydroxychloroquine level,  you will need to have it drawn 4 hours or more after your last dose.  If you wish to have your labs drawn at another location, please call the office 24 hours in advance so we can fax the orders.  The office is located at [address], [place], [place] [address]   If you have any questions regarding directions or hours of operation,  please call [phone].   As a reminder, please drink plenty of water prior to coming for your lab work. Thanks!

## 2023-01-24 ENCOUNTER — Other Ambulatory Visit: Payer: Self-pay | Admitting: Family Medicine

## 2023-01-24 DIAGNOSIS — F32A Depression, unspecified: Secondary | ICD-10-CM

## 2023-01-26 NOTE — Telephone Encounter (Signed)
Xanax 0.5 mg LOV: 09/25/22 Last Refill:11/24/22 Upcoming appt: 03/30/23

## 2023-01-27 NOTE — Telephone Encounter (Signed)
Pt aware of refill.

## 2023-02-03 DIAGNOSIS — E890 Postprocedural hypothyroidism: Secondary | ICD-10-CM | POA: Diagnosis not present

## 2023-02-03 DIAGNOSIS — M8580 Other specified disorders of bone density and structure, unspecified site: Secondary | ICD-10-CM | POA: Diagnosis not present

## 2023-02-03 DIAGNOSIS — E673 Hypervitaminosis D: Secondary | ICD-10-CM | POA: Diagnosis not present

## 2023-02-19 ENCOUNTER — Encounter (INDEPENDENT_AMBULATORY_CARE_PROVIDER_SITE_OTHER): Payer: Self-pay

## 2023-03-16 ENCOUNTER — Encounter: Payer: Self-pay | Admitting: Family Medicine

## 2023-03-16 MED ORDER — BUPROPION HCL ER (XL) 300 MG PO TB24: 300.0000 mg | ORAL_TABLET | Freq: Every day | ORAL | 1 refills | Status: DC

## 2023-03-19 ENCOUNTER — Other Ambulatory Visit: Payer: Self-pay | Admitting: Family Medicine

## 2023-03-19 DIAGNOSIS — F32A Depression, unspecified: Secondary | ICD-10-CM

## 2023-03-25 ENCOUNTER — Other Ambulatory Visit: Payer: Self-pay | Admitting: Family Medicine

## 2023-03-25 DIAGNOSIS — F419 Anxiety disorder, unspecified: Secondary | ICD-10-CM

## 2023-03-25 NOTE — Telephone Encounter (Signed)
Patient is requesting a refill of the following medications: Requested Prescriptions   Pending Prescriptions Disp Refills   ALPRAZolam (XANAX) 0.5 MG tablet [Pharmacy Med Name: ALPRAZOLAM 0.5 MG TABLET] 60 tablet 1    Sig: TAKE 1 TABLET BY MOUTH TWICE A DAY    Date of patient request: 03/25/23 Last office visit: 09/25/22 Date of last refill: 01/26/23 Last refill amount: 60x1 Follow up time period per chart: 6 months

## 2023-03-30 ENCOUNTER — Encounter: Payer: Self-pay | Admitting: Family Medicine

## 2023-03-30 ENCOUNTER — Ambulatory Visit (HOSPITAL_BASED_OUTPATIENT_CLINIC_OR_DEPARTMENT_OTHER)
Admission: RE | Admit: 2023-03-30 | Discharge: 2023-03-30 | Disposition: A | Discharge status: Home / Self Care | Payer: Federal, State, Local not specified - PPO | Source: Ambulatory Visit | Attending: Family Medicine | Admitting: Family Medicine

## 2023-03-30 ENCOUNTER — Ambulatory Visit (INDEPENDENT_AMBULATORY_CARE_PROVIDER_SITE_OTHER): Payer: Federal, State, Local not specified - PPO | Admitting: Family Medicine

## 2023-03-30 VITALS — BP 116/72 | HR 94 | Temp 97.9°F | Ht 62.0 in | Wt 143.6 lb

## 2023-03-30 DIAGNOSIS — E538 Deficiency of other specified B group vitamins: Secondary | ICD-10-CM | POA: Diagnosis not present

## 2023-03-30 DIAGNOSIS — R053 Chronic cough: Secondary | ICD-10-CM | POA: Diagnosis not present

## 2023-03-30 DIAGNOSIS — Z23 Encounter for immunization: Secondary | ICD-10-CM

## 2023-03-30 DIAGNOSIS — E038 Other specified hypothyroidism: Secondary | ICD-10-CM | POA: Diagnosis not present

## 2023-03-30 DIAGNOSIS — Z0000 Encounter for general adult medical examination without abnormal findings: Secondary | ICD-10-CM | POA: Diagnosis not present

## 2023-03-30 DIAGNOSIS — J45909 Unspecified asthma, uncomplicated: Secondary | ICD-10-CM | POA: Diagnosis not present

## 2023-03-30 DIAGNOSIS — M8589 Other specified disorders of bone density and structure, multiple sites: Secondary | ICD-10-CM

## 2023-03-30 DIAGNOSIS — R059 Cough, unspecified: Secondary | ICD-10-CM | POA: Diagnosis not present

## 2023-03-30 LAB — CBC WITH DIFFERENTIAL/PLATELET
Basophils Absolute: 0 10*3/uL (ref 0.0–0.1)
Basophils Relative: 0.3 % (ref 0.0–3.0)
Eosinophils Absolute: 0.2 10*3/uL (ref 0.0–0.7)
Eosinophils Relative: 3.1 % (ref 0.0–5.0)
HCT: 41.5 % (ref 36.0–46.0)
Hemoglobin: 13.4 g/dL (ref 12.0–15.0)
Lymphocytes Relative: 34.7 % (ref 12.0–46.0)
Lymphs Abs: 1.8 10*3/uL (ref 0.7–4.0)
MCHC: 32.4 g/dL (ref 30.0–36.0)
MCV: 90.7 fl (ref 78.0–100.0)
Monocytes Absolute: 0.4 10*3/uL (ref 0.1–1.0)
Monocytes Relative: 6.9 % (ref 3.0–12.0)
Neutro Abs: 2.9 10*3/uL (ref 1.4–7.7)
Neutrophils Relative %: 55 % (ref 43.0–77.0)
Platelets: 218 10*3/uL (ref 150.0–400.0)
RBC: 4.57 Mil/uL (ref 3.87–5.11)
RDW: 13.4 % (ref 11.5–15.5)
WBC: 5.3 10*3/uL (ref 4.0–10.5)

## 2023-03-30 LAB — BASIC METABOLIC PANEL
BUN: 13 mg/dL (ref 6–23)
CO2: 26 mEq/L (ref 19–32)
Calcium: 10.1 mg/dL (ref 8.4–10.5)
Chloride: 104 mEq/L (ref 96–112)
Creatinine, Ser: 0.85 mg/dL (ref 0.40–1.20)
GFR: 72.34 mL/min (ref >60.00)
Glucose, Bld: 82 mg/dL (ref 70–99)
Potassium: 4.4 mEq/L (ref 3.5–5.1)
Sodium: 140 mEq/L (ref 135–145)

## 2023-03-30 LAB — LIPID PANEL
Cholesterol: 191 mg/dL (ref 0–200)
HDL: 65.5 mg/dL (ref >39.00)
LDL Cholesterol: 109 mg/dL — ABNORMAL HIGH (ref 0–99)
NonHDL: 125.07
Total CHOL/HDL Ratio: 3
Triglycerides: 81 mg/dL (ref 0.0–149.0)
VLDL: 16.2 mg/dL (ref 0.0–40.0)

## 2023-03-30 LAB — VITAMIN D 25 HYDROXY (VIT D DEFICIENCY, FRACTURES): VITD: 95.69 ng/mL (ref 30.00–100.00)

## 2023-03-30 LAB — VITAMIN B12: Vitamin B-12: 534 pg/mL (ref 211–911)

## 2023-03-30 LAB — TSH: TSH: 3.62 u[IU]/mL (ref 0.35–5.50)

## 2023-03-30 LAB — HEPATIC FUNCTION PANEL
ALT: 14 U/L (ref 0–35)
AST: 19 U/L (ref 0–37)
Albumin: 4.6 g/dL (ref 3.5–5.2)
Alkaline Phosphatase: 89 U/L (ref 39–117)
Bilirubin, Direct: 0 mg/dL (ref 0.0–0.3)
Total Bilirubin: 0.3 mg/dL (ref 0.2–1.2)
Total Protein: 7.4 g/dL (ref 6.0–8.3)

## 2023-03-30 NOTE — Assessment & Plan Note (Signed)
Check labs and replete prn. 

## 2023-03-30 NOTE — Assessment & Plan Note (Signed)
Ongoing for pt.  She coughs w/ each deep breath on exam today.  Denies asthma or reflux issues.  Will get CXR to assess.  Pt expressed understanding and is in agreement w/ plan.

## 2023-03-30 NOTE — Assessment & Plan Note (Signed)
Pt's PE WNL w/ exception of cough.  UTD on pap, mammo, colonoscopy, Tdap.  Flu shot given today.  Check labs.  Anticipatory guidance provided.

## 2023-03-30 NOTE — Progress Notes (Signed)
Subjective:    Patient ID: [name] [name], female    DOB: 1960/12/20, 62 y.o.   MRN: [id number]  HPI CPE- UTD on pap, mammo, DEXA, colonoscopy, Tdap  Patient Care Team    Relationship Specialty Notifications Start End  Sheliah Hatch, MD PCP - General Family Medicine  06/10/12   Magrinat, Valentino Hue, MD (Inactive) Consulting Physician Oncology  05/30/15   Claud Kelp, MD Consulting Physician General Surgery  05/30/15   Lowella Grip, MD Consulting Physician Endocrinology  07/24/17   Luvenia Redden, MD  Gastroenterology  03/26/21     Health Maintenance  Topic Date Due   Cervical Cancer Screening (HPV/Pap Cotest)  07/23/2022   INFLUENZA VACCINE  10/05/2023 (Originally 02/05/2023)   DEXA SCAN  04/22/2024   Colonoscopy  05/02/2025   DTaP/Tdap/Td (4 - Td or Tdap) 01/25/2030   Hepatitis C Screening  Completed   HIV Screening  Completed   Zoster Vaccines- Shingrix  Completed   HPV VACCINES  Aged Out   COVID-19 Vaccine  Discontinued      Review of Systems Patient reports no vision/ hearing changes, adenopathy,fever, weight change,  persistant/recurrent hoarseness , swallowing issues, chest pain, palpitations, hemoptysis, dyspnea (rest/exertional/paroxysmal nocturnal), gastrointestinal bleeding (melena, rectal bleeding), abdominal pain, significant heartburn, bowel changes, GU symptoms (dysuria, hematuria, incontinence), Gyn symptoms (abnormal  bleeding, pain),  syncope, focal weakness, memory loss, numbness & tingling, skin/hair/nail changes, abnormal bruising or bleeding, anxiety, or depression.   + chronic cough    Objective:   Physical Exam General Appearance:    Alert, cooperative, no distress, appears stated age  Head:    Normocephalic, without obvious abnormality, atraumatic  Eyes:    PERRL, conjunctiva/corneas clear, EOM's intact both eyes  Ears:    Normal TM's and external ear canals, both ears  Nose:   Nares normal, septum midline, mucosa normal, no drainage    or  sinus tenderness  Throat:   Lips, mucosa, and tongue normal; teeth and gums normal  Neck:   Supple, symmetrical, trachea midline, no adenopathy;    Thyroid: no enlargement/tenderness/nodules  Back:     Symmetric, no curvature, ROM normal, no CVA tenderness  Lungs:     Clear to auscultation bilaterally, respirations unlabored, cough w/ each deep breath  Chest Wall:    No tenderness or deformity   Heart:    Regular rate and rhythm, S1 and S2 normal, no murmur, rub   or gallop  Breast Exam:    Deferred to GYN  Abdomen:     Soft, non-tender, bowel sounds active all four quadrants,    no masses, no organomegaly  Genitalia:    Deferred to GYN  Rectal:    Extremities:   Extremities normal, atraumatic, no cyanosis or edema  Pulses:   2+ and symmetric all extremities  Skin:   Skin color, texture, turgor normal, no rashes or lesions  Lymph nodes:   Cervical, supraclavicular, and axillary nodes normal  Neurologic:   CNII-XII intact, normal strength, sensation and reflexes    throughout          Assessment & Plan:

## 2023-03-30 NOTE — Assessment & Plan Note (Signed)
Chronic problem.  Check labs.  Adjust meds prn

## 2023-03-30 NOTE — Assessment & Plan Note (Signed)
UTD on DEXA.  Check Vit D level and replete prn.

## 2023-03-30 NOTE — Patient Instructions (Signed)
Follow up in 6 months to recheck thyroid (sooner if needed) We'll notify you of your lab results and make any changes if needed Go to the [facility] on [address] to get your chest xray Continue to work on healthy diet and regular exercise- you look great! Call with any questions or concerns Stay Safe!  Stay Healthy! Hang in there!!!

## 2023-04-01 ENCOUNTER — Telehealth: Payer: Self-pay

## 2023-04-01 NOTE — Telephone Encounter (Signed)
-----   Message from Neena Rhymes sent at 04/01/2023  7:29 AM EDT ----- Labs look great!  No changes at this time

## 2023-04-14 ENCOUNTER — Encounter: Payer: Self-pay | Admitting: Family Medicine

## 2023-04-17 ENCOUNTER — Telehealth: Payer: Self-pay

## 2023-04-17 NOTE — Telephone Encounter (Signed)
Patient viewed xray results via my chart. I called to see how she was doing and had to leave a vm. I also sent a my chart message to check on her. Asked that she call back or respond via my chart to let us know.

## 2023-04-17 NOTE — Telephone Encounter (Signed)
-----   Message from Neena Rhymes sent at 04/17/2023  9:37 AM EDT ----- Chest xray looks good (thankfully).  Please let me know how you are feeling

## 2023-05-25 ENCOUNTER — Other Ambulatory Visit: Payer: Self-pay | Admitting: Family Medicine

## 2023-05-25 DIAGNOSIS — F419 Anxiety disorder, unspecified: Secondary | ICD-10-CM

## 2023-05-25 NOTE — Telephone Encounter (Signed)
Last refill 03/25/2023 60 1 refill Last office visit 03/30/2023

## 2023-06-19 DIAGNOSIS — K2270 Barrett's esophagus without dysplasia: Secondary | ICD-10-CM | POA: Diagnosis not present

## 2023-06-25 ENCOUNTER — Encounter: Payer: Self-pay | Admitting: Family Medicine

## 2023-06-29 ENCOUNTER — Telehealth: Payer: Self-pay

## 2023-06-29 MED ORDER — BUPROPION HCL ER (XL) 150 MG PO TB24: 150.0000 mg | ORAL_TABLET | Freq: Every day | ORAL | 3 refills | Status: DC

## 2023-06-29 NOTE — Telephone Encounter (Signed)
Copied from CRM [id number]. Topic: Clinical - Medication Question >> Jun 29, 2023  8:36 AM Elizebeth Brooking wrote: Reason for CRM: Patient called in to know if you could decrease my dosage for Wellbutrin back down to 150 mg daily? Patient is requesting a callback

## 2023-06-29 NOTE — Telephone Encounter (Signed)
Prescription decreased to 150mg  daily and prescription sent to [facility]

## 2023-06-29 NOTE — Addendum Note (Signed)
Addended by: Sheliah Hatch on: 06/29/2023 03:54 PM   Modules accepted: Orders

## 2023-06-29 NOTE — Telephone Encounter (Signed)
Patient has been informed.
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
## 2023-07-20 DIAGNOSIS — K080 Exfoliation of teeth due to systemic causes: Secondary | ICD-10-CM | POA: Diagnosis not present

## 2023-07-23 ENCOUNTER — Other Ambulatory Visit: Payer: Self-pay | Admitting: Family Medicine

## 2023-07-23 DIAGNOSIS — F419 Anxiety disorder, unspecified: Secondary | ICD-10-CM

## 2023-07-23 NOTE — Telephone Encounter (Signed)
Requested Prescriptions   Pending Prescriptions Disp Refills   ALPRAZolam (XANAX) 0.5 MG tablet [Pharmacy Med Name: ALPRAZOLAM 0.5 MG TABLET] 60 tablet 1    Sig: TAKE 1 TABLET BY MOUTH TWICE A DAY     Date of patient request: 07/23/2023  Last office visit: 03/30/2023 Upcoming visit: 09/28/2023 Date of last refill: 05/25/2023 Last refill amount: 60

## 2023-07-24 ENCOUNTER — Other Ambulatory Visit: Payer: Self-pay | Admitting: Family Medicine

## 2023-07-27 ENCOUNTER — Ambulatory Visit: Payer: Federal, State, Local not specified - PPO | Admitting: Rheumatology

## 2023-07-27 ENCOUNTER — Other Ambulatory Visit: Payer: Self-pay | Admitting: Family Medicine

## 2023-07-27 DIAGNOSIS — M51369 Other intervertebral disc degeneration, lumbar region without mention of lumbar back pain or lower extremity pain: Secondary | ICD-10-CM

## 2023-07-27 DIAGNOSIS — M722 Plantar fascial fibromatosis: Secondary | ICD-10-CM

## 2023-07-27 DIAGNOSIS — E890 Postprocedural hypothyroidism: Secondary | ICD-10-CM

## 2023-07-27 DIAGNOSIS — D132 Benign neoplasm of duodenum: Secondary | ICD-10-CM

## 2023-07-27 DIAGNOSIS — Z8261 Family history of arthritis: Secondary | ICD-10-CM

## 2023-07-27 DIAGNOSIS — M25551 Pain in right hip: Secondary | ICD-10-CM

## 2023-07-27 DIAGNOSIS — Z840 Family history of diseases of the skin and subcutaneous tissue: Secondary | ICD-10-CM

## 2023-07-27 DIAGNOSIS — K2970 Gastritis, unspecified, without bleeding: Secondary | ICD-10-CM

## 2023-07-27 DIAGNOSIS — M25572 Pain in left ankle and joints of left foot: Secondary | ICD-10-CM

## 2023-07-27 DIAGNOSIS — Z8639 Personal history of other endocrine, nutritional and metabolic disease: Secondary | ICD-10-CM

## 2023-07-27 DIAGNOSIS — M8589 Other specified disorders of bone density and structure, multiple sites: Secondary | ICD-10-CM

## 2023-07-27 DIAGNOSIS — Z853 Personal history of malignant neoplasm of breast: Secondary | ICD-10-CM

## 2023-07-27 DIAGNOSIS — M170 Bilateral primary osteoarthritis of knee: Secondary | ICD-10-CM

## 2023-07-27 DIAGNOSIS — F32A Depression, unspecified: Secondary | ICD-10-CM

## 2023-07-27 DIAGNOSIS — R768 Other specified abnormal immunological findings in serum: Secondary | ICD-10-CM

## 2023-07-27 DIAGNOSIS — M19041 Primary osteoarthritis, right hand: Secondary | ICD-10-CM

## 2023-07-27 DIAGNOSIS — G629 Polyneuropathy, unspecified: Secondary | ICD-10-CM

## 2023-07-27 DIAGNOSIS — G8929 Other chronic pain: Secondary | ICD-10-CM
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
## 2023-08-04 ENCOUNTER — Other Ambulatory Visit: Payer: Self-pay | Admitting: *Deleted

## 2023-08-04 DIAGNOSIS — R768 Other specified abnormal immunological findings in serum: Secondary | ICD-10-CM | POA: Diagnosis not present

## 2023-08-05 NOTE — Progress Notes (Signed)
Urine protein creatinine ratio normal, CBC normal, CMP normal, complements normal, sed rate normal, ANA, double-stranded DNA and RNP pending

## 2023-08-06 LAB — ANTI-NUCLEAR AB-TITER (ANA TITER)
ANA TITER: 1:80 {titer} — ABNORMAL HIGH
ANA Titer 1: 1:80 {titer} — ABNORMAL HIGH

## 2023-08-06 LAB — C3 AND C4
C3 Complement: 165 mg/dL (ref 83–193)
C4 Complement: 31 mg/dL (ref 15–57)

## 2023-08-06 LAB — CBC WITH DIFFERENTIAL/PLATELET
Absolute Lymphocytes: 1976 {cells}/uL (ref 850–3900)
Absolute Monocytes: 514 {cells}/uL (ref 200–950)
Basophils Absolute: 13 {cells}/uL (ref 0–200)
Basophils Relative: 0.2 %
Eosinophils Absolute: 163 {cells}/uL (ref 15–500)
Eosinophils Relative: 2.5 %
HCT: 40.6 % (ref 35.0–45.0)
Hemoglobin: 14.1 g/dL (ref 11.7–15.5)
MCH: 32.2 pg (ref 27.0–33.0)
MCHC: 34.7 g/dL (ref 32.0–36.0)
MCV: 92.7 fL (ref 80.0–100.0)
MPV: 10.6 fL (ref 7.5–12.5)
Monocytes Relative: 7.9 %
Neutro Abs: 3835 {cells}/uL (ref 1500–7800)
Neutrophils Relative %: 59 %
Platelets: 267 10*3/uL (ref 140–400)
RBC: 4.38 10*6/uL (ref 3.80–5.10)
RDW: 13 % (ref 11.0–15.0)
Total Lymphocyte: 30.4 %
WBC: 6.5 10*3/uL (ref 3.8–10.8)

## 2023-08-06 LAB — COMPLETE METABOLIC PANEL WITH GFR
AG Ratio: 1.8 (calc) (ref 1.0–2.5)
ALT: 16 U/L (ref 6–29)
AST: 19 U/L (ref 10–35)
Albumin: 4.6 g/dL (ref 3.6–5.1)
Alkaline phosphatase (APISO): 99 U/L (ref 37–153)
BUN: 14 mg/dL (ref 7–25)
CO2: 31 mmol/L (ref 20–32)
Calcium: 10 mg/dL (ref 8.6–10.4)
Chloride: 102 mmol/L (ref 98–110)
Creat: 0.79 mg/dL (ref 0.50–1.05)
Globulin: 2.6 g/dL (ref 1.9–3.7)
Glucose, Bld: 97 mg/dL (ref 65–99)
Potassium: 4.5 mmol/L (ref 3.5–5.3)
Sodium: 141 mmol/L (ref 135–146)
Total Bilirubin: 0.2 mg/dL (ref 0.2–1.2)
Total Protein: 7.2 g/dL (ref 6.1–8.1)
eGFR: 83 mL/min/{1.73_m2} (ref >=60)

## 2023-08-06 LAB — PROTEIN / CREATININE RATIO, URINE
Creatinine, Urine: 44 mg/dL (ref 20–275)
Total Protein, Urine: <4 mg/dL — ABNORMAL LOW (ref 5–24)

## 2023-08-06 LAB — ANA: Anti Nuclear Antibody (ANA): POSITIVE — AB

## 2023-08-06 LAB — ANTI-DNA ANTIBODY, DOUBLE-STRANDED: ds DNA Ab: <1 [IU]/mL

## 2023-08-06 LAB — SEDIMENTATION RATE: Sed Rate: 19 mm/h (ref 0–30)

## 2023-08-06 LAB — RNP ANTIBODY: Ribonucleic Protein(ENA) Antibody, IgG: 1.1 AI — AB

## 2023-08-06 NOTE — Progress Notes (Signed)
RNP positive and stable.  Double-stranded ENA negative.

## 2023-08-11 ENCOUNTER — Ambulatory Visit: Payer: Federal, State, Local not specified - PPO | Attending: Rheumatology | Admitting: Rheumatology

## 2023-08-11 ENCOUNTER — Encounter: Payer: Self-pay | Admitting: Rheumatology

## 2023-08-11 VITALS — BP 124/80 | HR 86 | Resp 15 | Ht 62.0 in | Wt 154.0 lb

## 2023-08-11 DIAGNOSIS — M722 Plantar fascial fibromatosis: Secondary | ICD-10-CM

## 2023-08-11 DIAGNOSIS — G629 Polyneuropathy, unspecified: Secondary | ICD-10-CM

## 2023-08-11 DIAGNOSIS — Z8669 Personal history of other diseases of the nervous system and sense organs: Secondary | ICD-10-CM

## 2023-08-11 DIAGNOSIS — K2990 Gastroduodenitis, unspecified, without bleeding: Secondary | ICD-10-CM

## 2023-08-11 DIAGNOSIS — M8589 Other specified disorders of bone density and structure, multiple sites: Secondary | ICD-10-CM

## 2023-08-11 DIAGNOSIS — M170 Bilateral primary osteoarthritis of knee: Secondary | ICD-10-CM | POA: Diagnosis not present

## 2023-08-11 DIAGNOSIS — M25551 Pain in right hip: Secondary | ICD-10-CM | POA: Diagnosis not present

## 2023-08-11 DIAGNOSIS — Z8639 Personal history of other endocrine, nutritional and metabolic disease: Secondary | ICD-10-CM

## 2023-08-11 DIAGNOSIS — M19042 Primary osteoarthritis, left hand: Secondary | ICD-10-CM

## 2023-08-11 DIAGNOSIS — R768 Other specified abnormal immunological findings in serum: Secondary | ICD-10-CM | POA: Diagnosis not present

## 2023-08-11 DIAGNOSIS — F419 Anxiety disorder, unspecified: Secondary | ICD-10-CM

## 2023-08-11 DIAGNOSIS — K2970 Gastritis, unspecified, without bleeding: Secondary | ICD-10-CM

## 2023-08-11 DIAGNOSIS — R413 Other amnesia: Secondary | ICD-10-CM

## 2023-08-11 DIAGNOSIS — D132 Benign neoplasm of duodenum: Secondary | ICD-10-CM

## 2023-08-11 DIAGNOSIS — Z840 Family history of diseases of the skin and subcutaneous tissue: Secondary | ICD-10-CM

## 2023-08-11 DIAGNOSIS — Z853 Personal history of malignant neoplasm of breast: Secondary | ICD-10-CM

## 2023-08-11 DIAGNOSIS — M25572 Pain in left ankle and joints of left foot: Secondary | ICD-10-CM

## 2023-08-11 DIAGNOSIS — M19041 Primary osteoarthritis, right hand: Secondary | ICD-10-CM

## 2023-08-11 DIAGNOSIS — E890 Postprocedural hypothyroidism: Secondary | ICD-10-CM

## 2023-08-11 DIAGNOSIS — F32A Depression, unspecified: Secondary | ICD-10-CM

## 2023-08-11 DIAGNOSIS — G8929 Other chronic pain: Secondary | ICD-10-CM

## 2023-08-11 DIAGNOSIS — M533 Sacrococcygeal disorders, not elsewhere classified: Secondary | ICD-10-CM

## 2023-08-11 DIAGNOSIS — Z8261 Family history of arthritis: Secondary | ICD-10-CM

## 2023-08-11 DIAGNOSIS — M47816 Spondylosis without myelopathy or radiculopathy, lumbar region: Secondary | ICD-10-CM

## 2023-08-11 NOTE — Patient Instructions (Signed)
 Hand Exercises Hand exercises can be helpful for almost anyone. They can strengthen your hands and improve flexibility and movement. The exercises can also increase blood flow to the hands. These results can make your work and daily tasks easier for you. Hand exercises can be especially helpful for people who have joint pain from arthritis or nerve damage from using their hands over and over. These exercises can also help people who injure a hand. Exercises Most of these hand exercises are gentle stretching and motion exercises. It is usually safe to do them often throughout the day. Warming up your hands before exercise may help reduce stiffness. You can do this with gentle massage or by placing your hands in warm water for 10-15 minutes. It is normal to feel some stretching, pulling, tightness, or mild discomfort when you begin new exercises. In time, this will improve. Remember to always be careful and stop right away if you feel sudden, very bad pain or your pain gets worse. You want to get better and be safe. Ask your health care provider which exercises are safe for you. Do exercises exactly as told by your provider and adjust them as told. Do not begin these exercises until told by your provider. Knuckle bend or "claw" fist  Stand or sit with your arm, hand, and all five fingers pointed straight up. Make sure to keep your wrist straight. Gently bend your fingers down toward your palm until the tips of your fingers are touching your palm. Keep your big knuckle straight and only bend the small knuckles in your fingers. Hold this position for 10 seconds. Straighten your fingers back to your starting position. Repeat this exercise 5-10 times with each hand. Full finger fist  Stand or sit with your arm, hand, and all five fingers pointed straight up. Make sure to keep your wrist straight. Gently bend your fingers into your palm until the tips of your fingers are touching the middle of your  palm. Hold this position for 10 seconds. Extend your fingers back to your starting position, stretching every joint fully. Repeat this exercise 5-10 times with each hand. Straight fist  Stand or sit with your arm, hand, and all five fingers pointed straight up. Make sure to keep your wrist straight. Gently bend your fingers at the big knuckle, where your fingers meet your hand, and at the middle knuckle. Keep the knuckle at the tips of your fingers straight and try to touch the bottom of your palm. Hold this position for 10 seconds. Extend your fingers back to your starting position, stretching every joint fully. Repeat this exercise 5-10 times with each hand. Tabletop  Stand or sit with your arm, hand, and all five fingers pointed straight up. Make sure to keep your wrist straight. Gently bend your fingers at the big knuckle, where your fingers meet your hand, as far down as you can. Keep the small knuckles in your fingers straight. Think of forming a tabletop with your fingers. Hold this position for 10 seconds. Extend your fingers back to your starting position, stretching every joint fully. Repeat this exercise 5-10 times with each hand. Finger spread  Place your hand flat on a table with your palm facing down. Make sure your wrist stays straight. Spread your fingers and thumb apart from each other as far as you can until you feel a gentle stretch. Hold this position for 10 seconds. Bring your fingers and thumb tight together again. Hold this position for 10 seconds. Repeat  this exercise 5-10 times with each hand. Making circles  Stand or sit with your arm, hand, and all five fingers pointed straight up. Make sure to keep your wrist straight. Make a circle by touching the tip of your thumb to the tip of your index finger. Hold for 10 seconds. Then open your hand wide. Repeat this motion with your thumb and each of your fingers. Repeat this exercise 5-10 times with each hand. Thumb  motion  Sit with your forearm resting on a table and your wrist straight. Your thumb should be facing up toward the ceiling. Keep your fingers relaxed as you move your thumb. Lift your thumb up as high as you can toward the ceiling. Hold for 10 seconds. Bend your thumb across your palm as far as you can, reaching the tip of your thumb for the small finger (pinkie) side of your palm. Hold for 10 seconds. Repeat this exercise 5-10 times with each hand. Grip strengthening  Hold a stress ball or other soft ball in the middle of your hand. Slowly increase the pressure, squeezing the ball as much as you can without causing pain. Think of bringing the tips of your fingers into the middle of your palm. All of your finger joints should bend when doing this exercise. Hold your squeeze for 10 seconds, then relax. Repeat this exercise 5-10 times with each hand. Contact a health care provider if: Your hand pain or discomfort gets much worse when you do an exercise. Your hand pain or discomfort does not improve within 2 hours after you exercise. If you have either of these problems, stop doing these exercises right away. Do not do them again unless your provider says that you can. Get help right away if: You develop sudden, severe hand pain or swelling. If this happens, stop doing these exercises right away. Do not do them again unless your provider says that you can. This information is not intended to replace advice given to you by your health care provider. Make sure you discuss any questions you have with your health care provider. Document Revised: 07/08/2022 Document Reviewed: 07/08/2022 Elsevier Patient Education  2024 [employer] Exercises for Chronic Knee Pain Chronic knee pain is pain that lasts longer than 3 months. For most people with chronic knee pain, exercise and weight loss is an important part of treatment. Your health care provider may want you to focus on: Making the muscles that  support your knee stronger. This can take pressure off your knee and reduce pain. Preventing knee stiffness. How far you can move your knee, keeping it there or making it farther. Losing weight (if this applies) to take pressure off your knee, lower your risk for injury, and make it easier for you to exercise. Your provider will help you make an exercise program that fits your needs and physical abilities. Below are simple, low-impact exercises you can do at home. Ask your provider or physical therapist how often you should do your exercise program and how many times to repeat each exercise. General safety tips  Get your provider's approval before doing any exercises. Start slowly and stop any time you feel pain. Do not exercise if your knee pain is flaring up. Warm up first. Stretching a cold muscle can cause an injury. Do 5-10 minutes of easy movement or light stretching before beginning your exercises. Do 5-10 minutes of low-impact activity (like walking or cycling) before starting strengthening exercises. Contact your provider any time you have pain during  or after exercising. Exercise can cause discomfort but should not be painful. It is normal to be a little stiff or sore after exercising. Stretching and range-of-motion exercises Front thigh stretch  Stand up straight and support your body by holding on to a chair or resting one hand on a wall. With your legs straight and close together, bend one knee to lift your heel up toward your butt. Using one hand for support, grab your ankle with your free hand. Pull your foot up closer toward your butt to feel the stretch in front of your thigh. Hold the stretch for 30 seconds. Repeat __________ times. Complete this exercise __________ times a day. Back thigh stretch  Sit on the floor with your back straight and your legs out straight in front of you. Place the palms of your hands on the floor and slide them toward your feet as you bend at the  hip. Try to touch your nose to your knees and feel the stretch in the back of your thighs. Hold for 30 seconds. Repeat __________ times. Complete this exercise __________ times a day. Calf stretch  Stand facing a wall. Place the palms of your hands flat against the wall, arms extended, and lean slightly against the wall. Get into a lunge position with one leg bent at the knee and the other leg stretched out straight behind you. Keep both feet facing the wall and increase the bend in your knee while keeping the heel of the other leg flat on the ground. You should feel the stretch in your calf. Hold for 30 seconds. Repeat __________ times. Complete this exercise __________ times a day. Strengthening exercises Straight leg lift  Lie on your back with one knee bent and the other leg out straight. Slowly lift the straight leg without bending the knee. Lift until your foot is about 12 inches (30 cm) off the floor. Hold for 3-5 seconds and slowly lower your leg. Repeat __________ times. Complete this exercise __________ times a day. Single leg dip  Stand between two chairs and put both hands on the backs of the chairs for support. Extend one leg out straight with your body weight resting on the heel of the standing leg. Slowly bend your standing knee to dip your body to the level that is comfortable for you. Hold for 3-5 seconds. Repeat __________ times. Complete this exercise __________ times a day. Hamstring curls  Stand straight, knees close together, facing the back of a chair. Hold on to the back of a chair with both hands. Keep one leg straight. Bend the other knee while bringing the heel up toward the butt until the knee is bent at a 90-degree angle (right angle). Hold for 3-5 seconds. Repeat __________ times. Complete this exercise __________ times a day. Wall squat  Stand straight with your back, hips, and head against a wall. Step forward one foot at a time with your back  still against the wall. Your feet should be 2 feet (61 cm) from the wall at shoulder width. Keeping your back, hips, and head against the wall, slide down the wall to as close to a sitting position as you can get. Hold for 5-10 seconds, then slowly slide back up. Repeat __________ times. Complete this exercise __________ times a day. Step-ups  Stand in front of a sturdy platform or stool that is about 6 inches (15 cm) high. Slowly step up with your left / right foot, keeping your knee in line with your hip  and foot. Do not let your knee bend so far that you cannot see your toes. Hold on to a chair for balance, but do not use it for support. Slowly unlock your knee and lower yourself to the starting position. Repeat __________ times. Complete this exercise __________ times a day. Contact a health care provider if: Your exercises cause pain. Your pain is worse after you exercise. Your pain prevents you from doing your exercises. This information is not intended to replace advice given to you by your health care provider. Make sure you discuss any questions you have with your health care provider. Document Revised: 07/08/2022 Document Reviewed: 07/08/2022 Elsevier Patient Education  2024 [employer].

## 2023-09-17 ENCOUNTER — Other Ambulatory Visit: Payer: Self-pay | Admitting: Family Medicine

## 2023-09-17 DIAGNOSIS — F32A Depression, unspecified: Secondary | ICD-10-CM

## 2023-09-28 ENCOUNTER — Ambulatory Visit: Payer: Federal, State, Local not specified - PPO | Admitting: Family Medicine

## 2023-09-29 DIAGNOSIS — L570 Actinic keratosis: Secondary | ICD-10-CM | POA: Diagnosis not present

## 2023-10-06 ENCOUNTER — Ambulatory Visit (INDEPENDENT_AMBULATORY_CARE_PROVIDER_SITE_OTHER): Admitting: Family Medicine

## 2023-10-06 ENCOUNTER — Encounter: Payer: Self-pay | Admitting: Family Medicine

## 2023-10-06 VITALS — BP 102/68 | HR 68 | Temp 98.2°F | Wt 154.4 lb

## 2023-10-06 DIAGNOSIS — E038 Other specified hypothyroidism: Secondary | ICD-10-CM | POA: Diagnosis not present

## 2023-10-06 DIAGNOSIS — Z6828 Body mass index (BMI) 28.0-28.9, adult: Secondary | ICD-10-CM

## 2023-10-06 DIAGNOSIS — E663 Overweight: Secondary | ICD-10-CM | POA: Diagnosis not present

## 2023-10-06 LAB — BASIC METABOLIC PANEL WITH GFR
BUN: 12 mg/dL (ref 6–23)
CO2: 29 meq/L (ref 19–32)
Calcium: 9.8 mg/dL (ref 8.4–10.5)
Chloride: 105 meq/L (ref 96–112)
Creatinine, Ser: 0.68 mg/dL (ref 0.40–1.20)
GFR: 91.63 mL/min (ref >60.00)
Glucose, Bld: 89 mg/dL (ref 70–99)
Potassium: 4.9 meq/L (ref 3.5–5.1)
Sodium: 141 meq/L (ref 135–145)

## 2023-10-06 LAB — HEPATIC FUNCTION PANEL
ALT: 13 U/L (ref 0–35)
AST: 17 U/L (ref 0–37)
Albumin: 4.6 g/dL (ref 3.5–5.2)
Alkaline Phosphatase: 87 U/L (ref 39–117)
Bilirubin, Direct: 0 mg/dL (ref 0.0–0.3)
Total Bilirubin: 0.2 mg/dL (ref 0.2–1.2)
Total Protein: 7.1 g/dL (ref 6.0–8.3)

## 2023-10-06 LAB — LIPID PANEL
Cholesterol: 196 mg/dL (ref 0–200)
HDL: 62.1 mg/dL (ref >39.00)
LDL Cholesterol: 97 mg/dL (ref 0–99)
NonHDL: 133.96
Total CHOL/HDL Ratio: 3
Triglycerides: 187 mg/dL — ABNORMAL HIGH (ref 0.0–149.0)
VLDL: 37.4 mg/dL (ref 0.0–40.0)

## 2023-10-06 LAB — CBC WITH DIFFERENTIAL/PLATELET
Basophils Absolute: 0 10*3/uL (ref 0.0–0.1)
Basophils Relative: 0.4 % (ref 0.0–3.0)
Eosinophils Absolute: 0.2 10*3/uL (ref 0.0–0.7)
Eosinophils Relative: 3.9 % (ref 0.0–5.0)
HCT: 39.5 % (ref 36.0–46.0)
Hemoglobin: 13.4 g/dL (ref 12.0–15.0)
Lymphocytes Relative: 36.3 % (ref 12.0–46.0)
Lymphs Abs: 2.2 10*3/uL (ref 0.7–4.0)
MCHC: 33.8 g/dL (ref 30.0–36.0)
MCV: 91.8 fl (ref 78.0–100.0)
Monocytes Absolute: 0.5 10*3/uL (ref 0.1–1.0)
Monocytes Relative: 8.8 % (ref 3.0–12.0)
Neutro Abs: 3.1 10*3/uL (ref 1.4–7.7)
Neutrophils Relative %: 50.6 % (ref 43.0–77.0)
Platelets: 232 10*3/uL (ref 150.0–400.0)
RBC: 4.3 Mil/uL (ref 3.87–5.11)
RDW: 13.1 % (ref 11.5–15.5)
WBC: 6 10*3/uL (ref 4.0–10.5)

## 2023-10-06 LAB — T3, FREE: T3, Free: 3.3 pg/mL (ref 2.3–4.2)

## 2023-10-06 LAB — T4, FREE: Free T4: 0.88 ng/dL (ref 0.60–1.60)

## 2023-10-06 LAB — TSH: TSH: 2.34 u[IU]/mL (ref 0.35–5.50)

## 2023-10-06 NOTE — Patient Instructions (Signed)
 Schedule your complete physical in 6 months We'll notify you of your lab results and make any changes if needed I'm SO proud of you and your healthy choices!! Call with any questions or concerns Stay Safe!  Stay Healthy! Happy Spring!!!

## 2023-10-06 NOTE — Progress Notes (Signed)
   Subjective:    Patient ID: [name] [name], female    DOB: 1961/01/02, 63 y.o.   MRN: [id number]  HPI Hypothyroid- currently on Levothyroxine daily.  Pt reports weight gain, brain fog.  + dry hair, skin  Overweight- pt's has gained 11 lbs since CPE.  BMI now 28.24  She is at the gym daily- both cardio and weights.  Pt is working on diet- more fruits and veggies.   Review of Systems For ROS see HPI     Objective:   Physical Exam Vitals reviewed.  Constitutional:      General: She is not in acute distress.    Appearance: Normal appearance. She is well-developed. She is not ill-appearing.  HENT:     Head: Normocephalic and atraumatic.  Eyes:     Conjunctiva/sclera: Conjunctivae normal.     Pupils: Pupils are equal, round, and reactive to light.  Neck:     Thyroid: No thyromegaly.  Cardiovascular:     Rate and Rhythm: Normal rate and regular rhythm.     Pulses: Normal pulses.     Heart sounds: Normal heart sounds. No murmur heard. Pulmonary:     Effort: Pulmonary effort is normal. No respiratory distress.     Breath sounds: Normal breath sounds.  Abdominal:     General: There is no distension.     Palpations: Abdomen is soft.     Tenderness: There is no abdominal tenderness.  Musculoskeletal:     Cervical back: Normal range of motion and neck supple.     Right lower leg: No edema.     Left lower leg: No edema.  Lymphadenopathy:     Cervical: No cervical adenopathy.  Skin:    General: Skin is warm and dry.  Neurological:     General: No focal deficit present.     Mental Status: She is alert and oriented to person, place, and time.  Psychiatric:        Mood and Affect: Mood normal.        Behavior: Behavior normal.           Assessment & Plan:

## 2023-10-07 ENCOUNTER — Encounter: Payer: Self-pay | Admitting: Family Medicine

## 2023-10-07 DIAGNOSIS — E039 Hypothyroidism, unspecified: Secondary | ICD-10-CM

## 2023-10-07 MED ORDER — LEVOTHYROXINE SODIUM 112 MCG PO TABS: 112.0000 ug | ORAL_TABLET | Freq: Every day | ORAL | 3 refills | Status: DC

## 2023-10-08 ENCOUNTER — Telehealth: Payer: Self-pay

## 2023-10-08 NOTE — Telephone Encounter (Unsigned)
 Copied from CRM [id number]. Topic: General - Other >> Oct 08, 2023  9:03 AM Fredrich Romans wrote: Reason for CRM: Patient was advised to have thyroid levels rechecked 1 month after she started medication.She would like for orders to be placed so that she could come the beginning of May to have those labs drawn

## 2023-10-08 NOTE — Telephone Encounter (Signed)
 This has been ordered and Message was sent via MyChart to patient informing her

## 2023-10-17 ENCOUNTER — Other Ambulatory Visit: Payer: Self-pay | Admitting: Family Medicine

## 2023-10-30 ENCOUNTER — Other Ambulatory Visit: Payer: Self-pay | Admitting: Family Medicine

## 2023-11-02 ENCOUNTER — Telehealth: Payer: Self-pay | Admitting: Family Medicine

## 2023-11-02 ENCOUNTER — Other Ambulatory Visit: Payer: Self-pay | Admitting: Family Medicine

## 2023-11-02 NOTE — Telephone Encounter (Signed)
 Ok to resend prescriptions for Levothyroxine  and Wellbutrin 

## 2023-11-02 NOTE — Telephone Encounter (Signed)
 Copied from CRM [id number]. Topic: Clinical - Prescription Issue >> Nov 02, 2023 10:34 AM [name] [name] wrote: Reason for CRM: Patient recently turned 59 and has picked up [facility] who goes through [facility] and they now have all of her prescriptions. She wants to all her [facility] sent [facility] and not [facility].

## 2023-11-02 NOTE — Telephone Encounter (Signed)
 Called pharmacy and they stated her levothyroxine  and Wellbutrin  were the only 2 prescriptions sent out to the [facility]. There is no documentation stating patient wanting to have that pharmacy added or changed. In her chart it states [facility]. When I talked to the pharmacy they stated they can not refill these 2 medication without a prescription and stated we may have to resend the full prescription again for patient but they are going to try and receive these prescriptions from [facility].   When talking to patient to clarify exactly what happened she stated she has 4 days left of her levothyroxine  and states she is aware she has 3 refills through the pharmacy but [facility] not [facility]   If needed can we [facility] these [facility] that [facility] on [facility]?

## 2023-11-02 NOTE — Telephone Encounter (Signed)
 Copied from CRM [id number]. Topic: Clinical - Medication Refill >> Nov 02, 2023 10:35 AM Chuck Crater wrote: Most Recent Primary Care Visit:  Provider: TABORI, KATHERINE E  Department: [facility]  Visit Type: OFFICE VISIT  Date: 10/06/2023  Medication: levothyroxine  (SYNTHROID ) 112 MCG tablet   Has the patient contacted their pharmacy? Yes (Agent: If no, request that the patient contact the pharmacy for the refill. If patient does not wish to contact the pharmacy document the reason why and proceed with request.) (Agent: If yes, when and what did the pharmacy advise?) Advised that they dont have a prescription [facility] has it.  Is this the correct pharmacy for this prescription? Yes If no, delete pharmacy and type the correct one.  This is the patient's preferred pharmacy:  [id number] [place], [place] - [address] AT [facility] [address] [place] [address] Phone: [phone] Fax: [phone]   Has the prescription been filled recently? No 1st refill  Is the patient out of the medication? No 4 days left  Has the patient been seen for an appointment in the last year OR does the patient have an upcoming appointment? Yes  Can we respond through MyChart? Yes  Agent: Please be advised that Rx refills may take up to 3 business days. We ask that you follow-up with your pharmacy.

## 2023-11-03 ENCOUNTER — Other Ambulatory Visit: Payer: Self-pay

## 2023-11-03 MED ORDER — LEVOTHYROXINE SODIUM 112 MCG PO TABS: 112.0000 ug | ORAL_TABLET | Freq: Every day | ORAL | 3 refills | Status: DC

## 2023-11-03 MED ORDER — BUPROPION HCL ER (XL) 150 MG PO TB24: 150.0000 mg | ORAL_TABLET | Freq: Every day | ORAL | 3 refills | Status: DC

## 2023-11-03 NOTE — Addendum Note (Signed)
 Addended by: Nariyah Osias K on: 11/03/2023 09:58 AM   Modules accepted: Orders

## 2023-11-03 NOTE — Telephone Encounter (Signed)
 [facility]

## 2023-11-04 ENCOUNTER — Other Ambulatory Visit: Payer: Self-pay | Admitting: Family Medicine

## 2023-11-04 DIAGNOSIS — E663 Overweight: Secondary | ICD-10-CM | POA: Insufficient documentation

## 2023-11-04 DIAGNOSIS — F419 Anxiety disorder, unspecified: Secondary | ICD-10-CM

## 2023-11-04 NOTE — Telephone Encounter (Signed)
 Copied from CRM [id number]. Topic: Clinical - Medication Refill >> Nov 04, 2023  9:03 AM Varney Gentleman wrote: Most Recent Primary Care Visit:  Provider: TABORI, KATHERINE E  Department: [facility]  Visit Type: OFFICE VISIT  Date: 10/06/2023  Medication: ALPRAZolam  (XANAX ) 0.5 MG tablet, valACYclovir  (VALTREX ) 500 MG tablet  Has the patient contacted their pharmacy? Yes, Pharmacy calling (Agent: If no, request that the patient contact the pharmacy for the refill. If patient does not wish to contact the pharmacy document the reason why and proceed with request.) (Agent: If yes, when and what did the pharmacy advise?)  Is this the correct pharmacy for this prescription? Yes If no, delete pharmacy and type the correct one.  This is the patient's preferred pharmacy:   [facility] - [place], [place] - [address] [phone] [address] [place] [address]  Has the prescription been filled recently? No  Is the patient out of the medication? No  Has the patient been seen for an appointment in the last year OR does the patient have an upcoming appointment? Yes  Can we respond through MyChart? No  Agent: Please be advised that Rx refills may take up to 3 business days. We ask that you follow-up with your pharmacy.

## 2023-11-04 NOTE — Assessment & Plan Note (Signed)
 Deteriorated.  Pt has gained 11 lbs since CPE.  BMI now 28.24  She reports she is at the gym daily doing both cardio and weights.  She is very frustrated b/c she has changed her diet- eating more fruits and veggies.  She feels that this is likely due to her ongoing thyroid  issues.  Applauded her healthy choices and encouraged her to continue.  Will follow.

## 2023-11-04 NOTE — Assessment & Plan Note (Signed)
 Chronic problem.  Pt reports she is having weight gain, brain fog, dry skin and hair.  She reports she typically feels this way when her thyroid  is 'off'.  Check labs.  Adjust meds prn

## 2023-11-05 NOTE — Telephone Encounter (Signed)
 RN called pharmacy about requested refills xanax  and valtrex . There is remaining refills on valtrex . Xanax  cannot be requested at pharm until 5/10. RN attempted contact to update pt, no answer, LVM.

## 2023-11-06 MED ORDER — ALPRAZOLAM 0.5 MG PO TABS: 0.5000 mg | ORAL_TABLET | Freq: Two times a day (BID) | ORAL | 1 refills | Status: DC

## 2023-11-06 NOTE — Telephone Encounter (Signed)
 Requested Prescriptions   Pending Prescriptions Disp Refills   ALPRAZolam  (XANAX ) 0.5 MG tablet 60 tablet 1    Sig: Take 1 tablet (0.5 mg total) by mouth 2 (two) times daily.     Date of patient request: 11/06/2023 Last office visit: 10/06/2023 Upcoming visit: 03/30/2024 Date of last refill: 09/17/2023 Last refill amount: 60

## 2023-11-09 ENCOUNTER — Other Ambulatory Visit (INDEPENDENT_AMBULATORY_CARE_PROVIDER_SITE_OTHER)

## 2023-11-09 DIAGNOSIS — E039 Hypothyroidism, unspecified: Secondary | ICD-10-CM

## 2023-11-09 LAB — TSH: TSH: 2.74 u[IU]/mL (ref 0.35–5.50)

## 2023-11-09 LAB — T3, FREE: T3, Free: 3.8 pg/mL (ref 2.3–4.2)

## 2023-11-09 LAB — T4, FREE: Free T4: 0.87 ng/dL (ref 0.60–1.60)

## 2023-11-10 ENCOUNTER — Encounter: Payer: Self-pay | Admitting: Family Medicine

## 2023-11-24 DIAGNOSIS — L814 Other melanin hyperpigmentation: Secondary | ICD-10-CM | POA: Diagnosis not present

## 2023-11-24 DIAGNOSIS — D2261 Melanocytic nevi of right upper limb, including shoulder: Secondary | ICD-10-CM | POA: Diagnosis not present

## 2023-11-24 DIAGNOSIS — L570 Actinic keratosis: Secondary | ICD-10-CM | POA: Diagnosis not present

## 2023-11-24 DIAGNOSIS — D1801 Hemangioma of skin and subcutaneous tissue: Secondary | ICD-10-CM | POA: Diagnosis not present

## 2023-12-10 ENCOUNTER — Other Ambulatory Visit: Payer: Self-pay | Admitting: Family Medicine

## 2024-01-11 ENCOUNTER — Other Ambulatory Visit: Payer: Self-pay | Admitting: Family Medicine

## 2024-01-11 DIAGNOSIS — F419 Anxiety disorder, unspecified: Secondary | ICD-10-CM

## 2024-01-11 NOTE — Telephone Encounter (Signed)
 Requested Prescriptions   Pending Prescriptions Disp Refills   ALPRAZolam  (XANAX ) 0.5 MG tablet [Pharmacy Med Name: ALPRAZOLAM  0.5 MG TABLET] 60 tablet 1    Sig: TAKE 1 TABLET BY MOUTH TWICE A DAY     Date of patient request: 01/11/2024 Last office visit: 10/06/2023 Upcoming visit: 03/30/2024 Date of last refill: 11/06/2023 Last refill amount: 60x1

## 2024-01-25 DIAGNOSIS — Z1330 Encounter for screening examination for mental health and behavioral disorders, unspecified: Secondary | ICD-10-CM | POA: Diagnosis not present

## 2024-01-25 DIAGNOSIS — Z853 Personal history of malignant neoplasm of breast: Secondary | ICD-10-CM | POA: Diagnosis not present

## 2024-01-25 DIAGNOSIS — D0510 Intraductal carcinoma in situ of unspecified breast: Secondary | ICD-10-CM | POA: Diagnosis not present

## 2024-01-25 DIAGNOSIS — Z9882 Breast implant status: Secondary | ICD-10-CM | POA: Diagnosis not present

## 2024-01-25 DIAGNOSIS — Z01419 Encounter for gynecological examination (general) (routine) without abnormal findings: Secondary | ICD-10-CM | POA: Diagnosis not present

## 2024-01-25 DIAGNOSIS — M8580 Other specified disorders of bone density and structure, unspecified site: Secondary | ICD-10-CM | POA: Diagnosis not present

## 2024-02-03 DIAGNOSIS — E673 Hypervitaminosis D: Secondary | ICD-10-CM | POA: Diagnosis not present

## 2024-02-03 DIAGNOSIS — M8589 Other specified disorders of bone density and structure, multiple sites: Secondary | ICD-10-CM | POA: Diagnosis not present

## 2024-02-03 DIAGNOSIS — E890 Postprocedural hypothyroidism: Secondary | ICD-10-CM | POA: Diagnosis not present

## 2024-02-03 DIAGNOSIS — Z0100 Encounter for examination of eyes and vision without abnormal findings: Secondary | ICD-10-CM | POA: Diagnosis not present

## 2024-03-03 ENCOUNTER — Other Ambulatory Visit: Payer: Self-pay | Admitting: Family Medicine

## 2024-03-03 DIAGNOSIS — F32A Depression, unspecified: Secondary | ICD-10-CM

## 2024-03-04 NOTE — Telephone Encounter (Signed)
 Requested Prescriptions   Pending Prescriptions Disp Refills   ALPRAZolam  (XANAX ) 0.5 MG tablet [Pharmacy Med Name: ALPRAZOLAM  0.5 MG TABLET] 60 tablet 1    Sig: TAKE 1 TABLET BY MOUTH TWICE A DAY     Date of patient request: 03/04/24 Last office visit: 10/06/2023 Upcoming visit: 03/30/2024 Date of last refill: 01/11/24 Last refill amount: 60 tablets

## 2024-03-15 ENCOUNTER — Ambulatory Visit: Payer: Self-pay

## 2024-03-15 NOTE — Telephone Encounter (Signed)
 I hate that I'm not able to get pt in before Friday but that is my soonest available appt if she wants to see me.  We can try and get her with someone else if she is willing, but otherwise, that is the best we can do.

## 2024-03-15 NOTE — Telephone Encounter (Signed)
 Appointment made for Friday 03/18/2024 at 9:20 AM with patient's PCP Dr Comer Greet.  Patient prefers to see her PCP and would like to be seen sooner if possible.   FYI Only or Action Required?: Action required by provider: Patient would like a sooner appointment to see her PCP if possible.  Patient was last seen in primary care on 10/06/2023 by Greet Comer BRAVO, MD.  Called Nurse Triage reporting Breast Problem.  Symptoms began yesterday.  Interventions attempted: Nothing.  Symptoms are: gradually worsening.  Triage Disposition: See PCP When Office is Open (Within 3 Days)  Patient/caregiver understands and will follow disposition?: Yes               Copied from CRM #[id number]. Topic: Clinical - Red Word Triage >> Mar 15, 2024  8:04 AM Suzen RAMAN wrote: Red Word that prompted transfer to Nurse Triage: Lefts side breast dropped(Previous breast surgery) pain, swelling, tightness and pinching. Requesting an appt with Dr. Tabori([facility]) Reason for Disposition  Change in shape or appearance of breast  Answer Assessment - Initial Assessment Questions A little over ten years ago---breast cancer, left breast implant dropped significantly, rolling in armpit and above--happened about 5:30 yesterday For the past week and a half patient states she has felt what feels like hot flashes where her face gets really warm Right breast feels tighter and pinching Left shoulder feels sore when lifting arm No injuries.    1. SYMPTOM: What's the main symptom you're concerned about?  (e.g., lump, nipple discharge, pain, rash)     Left implant feels like it dropped, swelling 2. LOCATION: Where is the breast concerns located?     Left breast 3. ONSET: When did symptoms  start?     5:30 PM last night 4. PRIOR HISTORY: Do you have any history of prior problems with your breasts? (e.g., breast cancer, breast implant, fibrocystic breast disease)     Breast cancer ten years  ago, breast implants--silicone 5. CAUSE: What do you think is causing this symptom?     Unsure 6. OTHER SYMPTOMS: Do you have any other symptoms? (e.g., breast pain, fever, nipple discharge, redness or rash)     Constant ache in left shoulder/arm  Protocols used: Breast Symptoms-A-AH

## 2024-03-15 NOTE — Telephone Encounter (Signed)
 Please see details below. I checked your schedule and 9/12 is the soonest appt you have available.

## 2024-03-15 NOTE — Telephone Encounter (Signed)
 Pt reports left breast yesterday a drop and pain shoulder and arm.

## 2024-03-15 NOTE — Telephone Encounter (Signed)
 Pt has appt Friday and was told if any increase in pain or changes to be seen sooner. Pt denied seeing another provider

## 2024-03-18 ENCOUNTER — Other Ambulatory Visit: Payer: Self-pay | Admitting: Family Medicine

## 2024-03-18 ENCOUNTER — Ambulatory Visit: Admitting: Family Medicine

## 2024-03-18 VITALS — BP 102/58 | HR 97 | Temp 98.0°F | Ht 62.0 in | Wt 150.0 lb

## 2024-03-18 DIAGNOSIS — T85848A Pain due to other internal prosthetic devices, implants and grafts, initial encounter: Secondary | ICD-10-CM

## 2024-03-18 DIAGNOSIS — N650 Deformity of reconstructed breast: Secondary | ICD-10-CM

## 2024-03-18 MED ORDER — PREDNISONE 10 MG PO TABS: ORAL_TABLET | ORAL | 0 refills | Status: DC

## 2024-03-18 NOTE — Patient Instructions (Signed)
 Follow up as needed or as scheduled START the Prednisone  as directed- 3 pills at the same time x3 days, then 2 pills at the same time x3 days, then 1 pill daily.  Take w/ food ICE Add Tylenol  to help w/ pain We'll call you to schedule your mammo and surgery appts Call with any questions or concerns Hang in there!!!

## 2024-03-18 NOTE — Progress Notes (Signed)
   Subjective:    Patient ID: [name] [name], female    DOB: 1961-03-26, 63 y.o.   MRN: [id number]  HPI L breast pain- pt reports she was in the shower on Monday evening when she lifted her arm and developed acute pain.  Pt reports implant has shifted.  Worst pain is in her armpit.  Now having pain lifting arm.  Unable to hold or lift objects w/ L hand.  No relief w/ Tylenol  or Ibuprofen.  Pt is not interested in narcotic pain medication.   Review of Systems For ROS see HPI     Objective:   Physical Exam Vitals reviewed.  Constitutional:      General: She is not in acute distress.    Comments: Obviously uncomfortable  Chest:    Neurological:     General: No focal deficit present.     Mental Status: She is alert and oriented to person, place, and time.  Psychiatric:        Mood and Affect: Mood normal.        Behavior: Behavior normal.        Thought Content: Thought content normal.           Assessment & Plan:  Pain and deformity of L breast implant- new.  Suspect implant rupture.  Will get diagnostic imaging and refer to Plastic Surgery ASAP due to the pain.  She is not interested in pain medication at this time but told her to reach out if she changes her mind.  Pt expressed understanding and is in agreement w/ plan.

## 2024-03-22 ENCOUNTER — Encounter: Payer: Self-pay | Admitting: Plastic Surgery

## 2024-03-22 ENCOUNTER — Encounter: Payer: Self-pay | Admitting: Family Medicine

## 2024-03-22 ENCOUNTER — Ambulatory Visit: Admitting: Plastic Surgery

## 2024-03-22 VITALS — BP 119/77 | HR 94 | Ht 62.0 in | Wt 151.0 lb

## 2024-03-22 DIAGNOSIS — N644 Mastodynia: Secondary | ICD-10-CM | POA: Diagnosis not present

## 2024-03-22 DIAGNOSIS — Z853 Personal history of malignant neoplasm of breast: Secondary | ICD-10-CM

## 2024-03-22 DIAGNOSIS — Z9013 Acquired absence of bilateral breasts and nipples: Secondary | ICD-10-CM

## 2024-03-22 DIAGNOSIS — T8543XA Leakage of breast prosthesis and implant, initial encounter: Secondary | ICD-10-CM | POA: Insufficient documentation

## 2024-03-22 DIAGNOSIS — M79604 Pain in right leg: Secondary | ICD-10-CM

## 2024-03-22 DIAGNOSIS — M4316 Spondylolisthesis, lumbar region: Secondary | ICD-10-CM

## 2024-03-22 DIAGNOSIS — D0510 Intraductal carcinoma in situ of unspecified breast: Secondary | ICD-10-CM

## 2024-03-22 DIAGNOSIS — N651 Disproportion of reconstructed breast: Secondary | ICD-10-CM
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
## 2024-03-23 ENCOUNTER — Ambulatory Visit
Admission: RE | Admit: 2024-03-23 | Discharge: 2024-03-23 | Disposition: A | Discharge status: Home / Self Care | Source: Ambulatory Visit | Attending: Family Medicine | Admitting: Family Medicine

## 2024-03-23 ENCOUNTER — Other Ambulatory Visit: Payer: Self-pay | Admitting: Family Medicine

## 2024-03-23 DIAGNOSIS — N650 Deformity of reconstructed breast: Secondary | ICD-10-CM

## 2024-03-23 DIAGNOSIS — T85848A Pain due to other internal prosthetic devices, implants and grafts, initial encounter: Secondary | ICD-10-CM

## 2024-03-23 DIAGNOSIS — N6489 Other specified disorders of breast: Secondary | ICD-10-CM | POA: Diagnosis not present

## 2024-03-24 ENCOUNTER — Other Ambulatory Visit

## 2024-03-30 ENCOUNTER — Encounter: Payer: Self-pay | Admitting: Family Medicine

## 2024-03-30 ENCOUNTER — Encounter: Admitting: Family Medicine

## 2024-03-30 ENCOUNTER — Ambulatory Visit: Admitting: Family Medicine

## 2024-03-30 VITALS — BP 110/60 | HR 96 | Temp 98.0°F | Ht 62.0 in | Wt 156.4 lb

## 2024-03-30 DIAGNOSIS — Z0000 Encounter for general adult medical examination without abnormal findings: Secondary | ICD-10-CM

## 2024-03-30 DIAGNOSIS — M8589 Other specified disorders of bone density and structure, multiple sites: Secondary | ICD-10-CM

## 2024-03-30 DIAGNOSIS — E038 Other specified hypothyroidism: Secondary | ICD-10-CM | POA: Diagnosis not present

## 2024-03-30 LAB — LIPID PANEL
Cholesterol: 227 mg/dL — ABNORMAL HIGH (ref 0–200)
HDL: 65.8 mg/dL (ref >39.00)
LDL Cholesterol: 120 mg/dL — ABNORMAL HIGH (ref 0–99)
NonHDL: 160.9
Total CHOL/HDL Ratio: 3
Triglycerides: 205 mg/dL — ABNORMAL HIGH (ref 0.0–149.0)
VLDL: 41 mg/dL — ABNORMAL HIGH (ref 0.0–40.0)

## 2024-03-30 LAB — T3, FREE: T3, Free: 3.1 pg/mL (ref 2.3–4.2)

## 2024-03-30 LAB — HEPATIC FUNCTION PANEL
ALT: 15 U/L (ref 0–35)
AST: 18 U/L (ref 0–37)
Albumin: 4.7 g/dL (ref 3.5–5.2)
Alkaline Phosphatase: 71 U/L (ref 39–117)
Bilirubin, Direct: 0.1 mg/dL (ref 0.0–0.3)
Total Bilirubin: 0.3 mg/dL (ref 0.2–1.2)
Total Protein: 7.1 g/dL (ref 6.0–8.3)

## 2024-03-30 LAB — CBC WITH DIFFERENTIAL/PLATELET
Basophils Absolute: 0 K/uL (ref 0.0–0.1)
Basophils Relative: 0.3 % (ref 0.0–3.0)
Eosinophils Absolute: 0.1 K/uL (ref 0.0–0.7)
Eosinophils Relative: 1.2 % (ref 0.0–5.0)
HCT: 41.2 % (ref 36.0–46.0)
Hemoglobin: 13.6 g/dL (ref 12.0–15.0)
Lymphocytes Relative: 35.5 % (ref 12.0–46.0)
Lymphs Abs: 2.3 K/uL (ref 0.7–4.0)
MCHC: 33 g/dL (ref 30.0–36.0)
MCV: 89 fl (ref 78.0–100.0)
Monocytes Absolute: 0.5 K/uL (ref 0.1–1.0)
Monocytes Relative: 7.5 % (ref 3.0–12.0)
Neutro Abs: 3.7 K/uL (ref 1.4–7.7)
Neutrophils Relative %: 55.5 % (ref 43.0–77.0)
Platelets: 230 K/uL (ref 150.0–400.0)
RBC: 4.63 Mil/uL (ref 3.87–5.11)
RDW: 13.4 % (ref 11.5–15.5)
WBC: 6.6 K/uL (ref 4.0–10.5)

## 2024-03-30 LAB — BASIC METABOLIC PANEL WITH GFR
BUN: 13 mg/dL (ref 6–23)
CO2: 30 meq/L (ref 19–32)
Calcium: 10 mg/dL (ref 8.4–10.5)
Chloride: 99 meq/L (ref 96–112)
Creatinine, Ser: 0.68 mg/dL (ref 0.40–1.20)
GFR: 91.32 mL/min (ref >60.00)
Glucose, Bld: 78 mg/dL (ref 70–99)
Potassium: 4.6 meq/L (ref 3.5–5.1)
Sodium: 139 meq/L (ref 135–145)

## 2024-03-30 LAB — T4, FREE: Free T4: 0.91 ng/dL (ref 0.60–1.60)

## 2024-03-30 LAB — VITAMIN D 25 HYDROXY (VIT D DEFICIENCY, FRACTURES): VITD: 49.51 ng/mL (ref 30.00–100.00)

## 2024-03-30 LAB — TSH: TSH: 5.09 u[IU]/mL (ref 0.35–5.50)

## 2024-03-30 NOTE — Progress Notes (Signed)
 Subjective:    Patient ID: [name] [name], female    DOB: 1960-12-29, 63 y.o.   MRN: [id number]  HPI Here today for Welcome to Medicare CPE.  Risk Factors: Hypothyroid- on Levothyroxine  Physical Activity: was exercising prior to implant rupture- lifting weights.  Continues to walk an hour a day x5 days Fall Risk: low Depression: chronic problem, currently on Wellbutrin  and Alprazolam  Hearing: normal to conversational tones and whispered voice ADL's: independent Cognitive: did not score well on mini-cog but is very anxious today and in pain from ruptured implant. Home Safety: Height, Weight, BMI, Visual Acuity: see vitals, vision corrected w/ contacts but does have cataracts that she is waiting to 'ripen' Counseling: UTD on DEXA, pap, colonoscopy, mammo, Tdap.  Declines PNA Living Will: has in place Health Care POA: has- daughter has original paperwork Labs Ordered: See A&P Care Plan: See A&P  Addiction Risk: does not drink alcohol or use drugs.  On prescription Alprazolam  as needed but has never misused.  Patient Care Team    Relationship Specialty Notifications Start End  Mahlon Comer BRAVO, MD PCP - General Family Medicine  03/30/23   Aalto, Meaghan M, MD PCP - OBGYN Obstetrics and Gynecology  03/30/23   Gail Favorite, MD Consulting Physician General Surgery  05/30/15   Aura Solomons, MD Consulting Physician Endocrinology  07/24/17   Claudene Oneil BIRCH, MD  Gastroenterology  03/26/21   Rachell Zachary Lot, OD  Optometry  03/30/24     Health Maintenance  Topic Date Due  . Medicare Annual Wellness (AWV)  Never done  . Pneumococcal Vaccine: 50+ Years (1 of 2 - PCV) Never done  . COVID-19 Vaccine (6 - 2025-26 season) 03/07/2024  . DEXA SCAN  04/22/2024  . Colonoscopy  05/14/2025  . Cervical Cancer Screening (HPV/Pap Cotest)  01/14/2028  . DTaP/Tdap/Td (4 - Td or Tdap) 01/25/2030  . Influenza Vaccine  Completed  . Hepatitis C Screening  Completed  . HIV Screening   Completed  . Zoster Vaccines- Shingrix  Completed  . Hepatitis B Vaccines 19-59 Average Risk  Aged Out  . HPV VACCINES  Aged Out  . Meningococcal B Vaccine  Aged Out      Review of Systems Patient reports no hearing changes, adenopathy,fever, weight change,  persistant/recurrent hoarseness , swallowing issues, chest pain, palpitations, edema, persistant/recurrent cough, hemoptysis, dyspnea (rest/exertional/paroxysmal nocturnal), gastrointestinal bleeding (melena, rectal bleeding), abdominal pain, significant heartburn, bowel changes, GU symptoms (dysuria, hematuria, incontinence), Gyn symptoms (abnormal  bleeding, pain),  syncope, focal weakness, memory loss, numbness & tingling, skin/hair/nail changes, abnormal bruising or bleeding, anxiety, or depression.   + cataracts    Objective:   Physical Exam General Appearance:    Alert, cooperative, no distress, appears stated age  Head:    Normocephalic, without obvious abnormality, atraumatic  Eyes:    PERRL, conjunctiva/corneas clear, EOM's intact both eyes  Ears:    Normal TM's and external ear canals, both ears  Nose:   Nares normal, septum midline, mucosa normal, no drainage    or sinus tenderness  Throat:   Lips, mucosa, and tongue normal; teeth and gums normal  Neck:   Supple, symmetrical, trachea midline, no adenopathy;    Thyroid : no enlargement/tenderness/nodules  Back:     Symmetric, no curvature, ROM normal, no CVA tenderness  Lungs:     Clear to auscultation bilaterally, respirations unlabored  Chest Wall:    No tenderness or deformity   Heart:    Regular rate and rhythm, S1 and S2  normal, no murmur, rub   or gallop  Breast Exam:    Deferred to mammo  Abdomen:     Soft, non-tender, bowel sounds active all four quadrants,    no masses, no organomegaly  Genitalia:    Deferred  Rectal:    Extremities:   Extremities normal, atraumatic, no cyanosis or edema  Pulses:   2+ and symmetric all extremities  Skin:   Skin color,  texture, turgor normal, no rashes or lesions  Lymph nodes:   Cervical, supraclavicular, and axillary nodes normal  Neurologic:   CNII-XII intact, normal strength, sensation and reflexes    throughout          Assessment & Plan:

## 2024-03-30 NOTE — Patient Instructions (Addendum)
 Follow up in 3 months to recheck weight We'll notify you of your lab results and make any changes if needed Continue to eat a low carb/low sugar diet and exercise as you are able Call with any questions or concerns Stay Safe!  Stay Healthy! Happy Fall!!!

## 2024-03-30 NOTE — Assessment & Plan Note (Signed)
 UTD on DEXA but due later this year.  Re-ordered.  Check Vit D level and replete prn.

## 2024-03-30 NOTE — Assessment & Plan Note (Signed)
 Pt's PE WNL w/ exception of ruptured L breast implant.  UTD on pap, mammo, colonoscopy, Tdap, DEXA.  Declines PNA vaccine.  Reviewed depression and fall screens.  Discussed results of mini-cog.  Check labs.  Encouraged low carb diet, regular physical activity as able.  Will continue to follow.

## 2024-03-31 ENCOUNTER — Ambulatory Visit: Payer: Self-pay | Admitting: Family Medicine

## 2024-03-31 ENCOUNTER — Other Ambulatory Visit: Payer: Self-pay

## 2024-03-31 DIAGNOSIS — E038 Other specified hypothyroidism: Secondary | ICD-10-CM

## 2024-03-31 MED ORDER — LEVOTHYROXINE SODIUM 125 MCG PO TABS: 125.0000 ug | ORAL_TABLET | Freq: Every day | ORAL | 3 refills | Status: DC

## 2024-03-31 NOTE — Progress Notes (Signed)
 Called pt she was at gym.  Pt will need lab visit made 1 month. Levothyroxine  has been sent in to pharmacy TSH has been future ordered.  Please schedule 1 month lab visit at time of call.  Pt has reviewed las results via MyChart

## 2024-04-06 DIAGNOSIS — Z719 Counseling, unspecified: Secondary | ICD-10-CM

## 2024-04-07 ENCOUNTER — Encounter: Payer: Self-pay | Admitting: Surgical

## 2024-04-07 ENCOUNTER — Ambulatory Visit (INDEPENDENT_AMBULATORY_CARE_PROVIDER_SITE_OTHER): Admitting: Surgical

## 2024-04-07 VITALS — BP 144/84 | HR 98 | Ht 62.0 in | Wt 147.4 lb

## 2024-04-07 DIAGNOSIS — Z853 Personal history of malignant neoplasm of breast: Secondary | ICD-10-CM | POA: Diagnosis not present

## 2024-04-07 DIAGNOSIS — Z9013 Acquired absence of bilateral breasts and nipples: Secondary | ICD-10-CM | POA: Diagnosis not present

## 2024-04-07 DIAGNOSIS — T8543XA Leakage of breast prosthesis and implant, initial encounter: Secondary | ICD-10-CM

## 2024-04-07 DIAGNOSIS — N651 Disproportion of reconstructed breast: Secondary | ICD-10-CM | POA: Diagnosis not present

## 2024-04-07 DIAGNOSIS — T8543XD Leakage of breast prosthesis and implant, subsequent encounter: Secondary | ICD-10-CM

## 2024-04-07 DIAGNOSIS — D0510 Intraductal carcinoma in situ of unspecified breast: Secondary | ICD-10-CM

## 2024-04-07 MED ORDER — TRAMADOL HCL 50 MG PO TABS: 50.0000 mg | ORAL_TABLET | Freq: Three times a day (TID) | ORAL | 0 refills | Status: AC | PRN

## 2024-04-07 MED ORDER — CEPHALEXIN 500 MG PO CAPS
500.0000 mg | ORAL_CAPSULE | Freq: Four times a day (QID) | ORAL | 0 refills | Status: AC
Start: 2024-04-07 — End: 2024-04-12

## 2024-04-07 MED ORDER — ONDANSETRON HCL 4 MG PO TABS: 4.0000 mg | ORAL_TABLET | Freq: Three times a day (TID) | ORAL | 0 refills | Status: DC | PRN
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
## 2024-04-08 NOTE — Addendum Note (Signed)
 Addended by: Izayah Miner on: 04/08/2024 09:46 AM   Modules accepted: Level of Service

## 2024-04-13 ENCOUNTER — Encounter (HOSPITAL_BASED_OUTPATIENT_CLINIC_OR_DEPARTMENT_OTHER): Payer: Self-pay | Admitting: Plastic Surgery

## 2024-04-13 ENCOUNTER — Other Ambulatory Visit: Payer: Self-pay

## 2024-04-18 ENCOUNTER — Ambulatory Visit (HOSPITAL_BASED_OUTPATIENT_CLINIC_OR_DEPARTMENT_OTHER)
Admission: RE | Admit: 2024-04-18 | Discharge: 2024-04-18 | Disposition: A | Discharge status: Home / Self Care | Source: Ambulatory Visit | Attending: Family Medicine | Admitting: Family Medicine

## 2024-04-18 DIAGNOSIS — M8589 Other specified disorders of bone density and structure, multiple sites: Secondary | ICD-10-CM

## 2024-04-20 NOTE — Anesthesia Preprocedure Evaluation (Signed)
 Anesthesia Evaluation  Patient identified by MRN, date of birth, ID band Patient awake    Reviewed: Allergy & Precautions, NPO status , Patient's Chart, lab work & pertinent test results  History of Anesthesia Complications Negative for: history of anesthetic complications  Airway Mallampati: II  TM Distance: >3 FB Neck ROM: Full   Comment: Previous grade I view with MAC 3, easy mask Dental  (+) Dental Advisory Given, Implants   Pulmonary neg shortness of breath, asthma (not on medications) , neg sleep apnea, neg COPD, neg recent URI, former smoker   Pulmonary exam normal breath sounds clear to auscultation       Cardiovascular negative cardio ROS  Rhythm:Regular Rate:Normal     Neuro/Psych  Headaches, neg Seizures PSYCHIATRIC DISORDERS Anxiety Depression       GI/Hepatic Neg liver ROS, hiatal hernia,GERD  ,,UC, H/o diverticulitis    Endo/Other  neg diabetesHypothyroidism    Renal/GU negative Renal ROS     Musculoskeletal  (+) Arthritis ,    Abdominal   Peds  Hematology negative hematology ROS (+) Lab Results      Component                Value               Date                      WBC                      6.6                 03/30/2024                HGB                      13.6                03/30/2024                HCT                      41.2                03/30/2024                MCV                      89.0                03/30/2024                PLT                      230.0               03/30/2024              Anesthesia Other Findings Dilaudid  caused hives. She reports she was told fentanyl  dropped her HR after her spine surgery in 2023. Does not appear to have caused issues intra-op. Patient okay with receiving fentanyl . Has also done well with morphine . Will monitor.  Reproductive/Obstetrics DCIS                              Anesthesia Physical Anesthesia  Plan  ASA: 2  Anesthesia Plan: General  Post-op Pain Management: Tylenol  PO (pre-op)*   Induction: Intravenous  PONV Risk Score and Plan: 3 and Ondansetron , Dexamethasone  and Treatment may vary due to age or medical condition  Airway Management Planned: Oral ETT  Additional Equipment:   Intra-op Plan:   Post-operative Plan: Extubation in OR  Informed Consent: I have reviewed the patients History and Physical, chart, labs and discussed the procedure including the risks, benefits and alternatives for the proposed anesthesia with the patient or authorized representative who has indicated his/her understanding and acceptance.     Dental advisory given  Plan Discussed with: CRNA and Anesthesiologist  Anesthesia Plan Comments: (Risks of general anesthesia discussed including, but not limited to, sore throat, hoarse voice, chipped/damaged teeth, injury to vocal cords, nausea and vomiting, allergic reactions, lung infection, heart attack, stroke, and death. All questions answered. )         Anesthesia Quick Evaluation

## 2024-04-21 ENCOUNTER — Ambulatory Visit (HOSPITAL_BASED_OUTPATIENT_CLINIC_OR_DEPARTMENT_OTHER): Payer: Self-pay | Admitting: Anesthesiology

## 2024-04-21 ENCOUNTER — Ambulatory Visit (HOSPITAL_BASED_OUTPATIENT_CLINIC_OR_DEPARTMENT_OTHER)
Admission: RE | Admit: 2024-04-21 | Discharge: 2024-04-21 | Disposition: A | Discharge status: Home / Self Care | Attending: Plastic Surgery | Admitting: Plastic Surgery

## 2024-04-21 ENCOUNTER — Encounter (HOSPITAL_BASED_OUTPATIENT_CLINIC_OR_DEPARTMENT_OTHER): Payer: Self-pay | Admitting: Plastic Surgery

## 2024-04-21 ENCOUNTER — Other Ambulatory Visit: Payer: Self-pay

## 2024-04-21 ENCOUNTER — Encounter (HOSPITAL_BASED_OUTPATIENT_CLINIC_OR_DEPARTMENT_OTHER)
Admission: RE | Disposition: A | Discharge status: Home / Self Care | Payer: Self-pay | Source: Home / Self Care | Attending: Plastic Surgery

## 2024-04-21 DIAGNOSIS — M1990 Unspecified osteoarthritis, unspecified site: Secondary | ICD-10-CM | POA: Diagnosis not present

## 2024-04-21 DIAGNOSIS — Z9013 Acquired absence of bilateral breasts and nipples: Secondary | ICD-10-CM

## 2024-04-21 DIAGNOSIS — T8549XA Other mechanical complication of breast prosthesis and implant, initial encounter: Secondary | ICD-10-CM | POA: Diagnosis not present

## 2024-04-21 DIAGNOSIS — F418 Other specified anxiety disorders: Secondary | ICD-10-CM | POA: Diagnosis not present

## 2024-04-21 DIAGNOSIS — K219 Gastro-esophageal reflux disease without esophagitis: Secondary | ICD-10-CM | POA: Diagnosis not present

## 2024-04-21 DIAGNOSIS — Z79899 Other long term (current) drug therapy: Secondary | ICD-10-CM | POA: Diagnosis not present

## 2024-04-21 DIAGNOSIS — E039 Hypothyroidism, unspecified: Secondary | ICD-10-CM | POA: Diagnosis not present

## 2024-04-21 DIAGNOSIS — T8543XA Leakage of breast prosthesis and implant, initial encounter: Secondary | ICD-10-CM | POA: Insufficient documentation

## 2024-04-21 DIAGNOSIS — N6031 Fibrosclerosis of right breast: Secondary | ICD-10-CM | POA: Diagnosis not present

## 2024-04-21 DIAGNOSIS — K449 Diaphragmatic hernia without obstruction or gangrene: Secondary | ICD-10-CM | POA: Diagnosis not present

## 2024-04-21 DIAGNOSIS — Z9882 Breast implant status: Secondary | ICD-10-CM | POA: Diagnosis not present

## 2024-04-21 DIAGNOSIS — Z45811 Encounter for adjustment or removal of right breast implant: Secondary | ICD-10-CM

## 2024-04-21 DIAGNOSIS — Z87891 Personal history of nicotine dependence: Secondary | ICD-10-CM | POA: Insufficient documentation

## 2024-04-21 DIAGNOSIS — J45909 Unspecified asthma, uncomplicated: Secondary | ICD-10-CM | POA: Insufficient documentation

## 2024-04-21 DIAGNOSIS — Z853 Personal history of malignant neoplasm of breast: Secondary | ICD-10-CM | POA: Diagnosis not present

## 2024-04-21 DIAGNOSIS — Y812 Prosthetic and other implants, materials and accessory general- and plastic-surgery devices associated with adverse incidents: Secondary | ICD-10-CM | POA: Diagnosis not present

## 2024-04-21 DIAGNOSIS — Z01818 Encounter for other preprocedural examination: Secondary | ICD-10-CM

## 2024-04-21 DIAGNOSIS — N6032 Fibrosclerosis of left breast: Secondary | ICD-10-CM | POA: Diagnosis not present

## 2024-04-21 HISTORY — PX: BREAST IMPLANT REMOVAL: SHX5361

## 2024-04-21 HISTORY — PX: CAPSULECTOMY: SHX5381

## 2024-04-21 SURGERY — REMOVAL, IMPLANT, BREAST
Anesthesia: General | Site: Breast | Laterality: Bilateral

## 2024-04-21 MED ORDER — MIDAZOLAM HCL 5 MG/5ML IJ SOLN
INTRAMUSCULAR | Status: DC | PRN
  Administered 2024-04-21: 2 mg via INTRAVENOUS

## 2024-04-21 MED ORDER — OXYCODONE HCL 5 MG PO TABS: 5.0000 mg | ORAL_TABLET | ORAL | Status: DC | PRN

## 2024-04-21 MED ORDER — EPHEDRINE SULFATE (PRESSORS) 50 MG/ML IJ SOLN
INTRAMUSCULAR | Status: DC | PRN
  Administered 2024-04-21: 10 mg via INTRAVENOUS

## 2024-04-21 MED ORDER — FENTANYL CITRATE (PF) 100 MCG/2ML IJ SOLN
INTRAMUSCULAR | Status: AC
  Filled 2024-04-21: qty 2

## 2024-04-21 MED ORDER — OXYCODONE HCL 5 MG PO TABS
5.0000 mg | ORAL_TABLET | Freq: Once | ORAL | Status: AC | PRN
  Administered 2024-04-21: 5 mg via ORAL

## 2024-04-21 MED ORDER — MORPHINE SULFATE (PF) 4 MG/ML IV SOLN
1.0000 mg | INTRAVENOUS | Status: DC | PRN
  Administered 2024-04-21 (×2): 1 mg via INTRAVENOUS

## 2024-04-21 MED ORDER — BUPIVACAINE LIPOSOME 1.3 % IJ SUSP
INTRAMUSCULAR | Status: DC | PRN
  Administered 2024-04-21: 20 mL

## 2024-04-21 MED ORDER — DEXAMETHASONE SODIUM PHOSPHATE 4 MG/ML IJ SOLN
INTRAMUSCULAR | Status: DC | PRN
  Administered 2024-04-21: 5 mg via INTRAVENOUS

## 2024-04-21 MED ORDER — ACETAMINOPHEN 500 MG PO TABS
1000.0000 mg | ORAL_TABLET | Freq: Once | ORAL | Status: AC
  Administered 2024-04-21: 1000 mg via ORAL

## 2024-04-21 MED ORDER — BUPIVACAINE LIPOSOME 1.3 % IJ SUSP
INTRAMUSCULAR | Status: AC
Start: 2024-04-21 — End: 2024-04-21
  Filled 2024-04-21: qty 20

## 2024-04-21 MED ORDER — OXYCODONE HCL 5 MG/5ML PO SOLN: 5.0000 mg | Freq: Once | ORAL | Status: AC | PRN

## 2024-04-21 MED ORDER — MIDAZOLAM HCL 2 MG/2ML IJ SOLN
INTRAMUSCULAR | Status: AC
  Filled 2024-04-21: qty 2

## 2024-04-21 MED ORDER — AMISULPRIDE (ANTIEMETIC) 5 MG/2ML IV SOLN: 10.0000 mg | Freq: Once | INTRAVENOUS | Status: DC | PRN

## 2024-04-21 MED ORDER — CEFAZOLIN SODIUM-DEXTROSE 2-4 GM/100ML-% IV SOLN
2.0000 g | INTRAVENOUS | Status: AC
Start: 2024-04-21 — End: 2024-04-21
  Administered 2024-04-21: 2 g via INTRAVENOUS

## 2024-04-21 MED ORDER — ACETAMINOPHEN 325 MG RE SUPP: 650.0000 mg | RECTAL | Status: DC | PRN

## 2024-04-21 MED ORDER — LIDOCAINE-EPINEPHRINE 1 %-1:100000 IJ SOLN
INTRAMUSCULAR | Status: DC | PRN
  Administered 2024-04-21: 7 mL

## 2024-04-21 MED ORDER — PHENYLEPHRINE HCL (PRESSORS) 10 MG/ML IV SOLN
INTRAVENOUS | Status: DC | PRN
  Administered 2024-04-21: 80 ug via INTRAVENOUS

## 2024-04-21 MED ORDER — LIDOCAINE HCL (CARDIAC) PF 100 MG/5ML IV SOSY
PREFILLED_SYRINGE | INTRAVENOUS | Status: DC | PRN
  Administered 2024-04-21: 80 mg via INTRAVENOUS

## 2024-04-21 MED ORDER — CHLORHEXIDINE GLUCONATE CLOTH 2 % EX PADS: 6.0000 | MEDICATED_PAD | Freq: Once | CUTANEOUS | Status: DC

## 2024-04-21 MED ORDER — ONDANSETRON HCL 4 MG/2ML IJ SOLN
INTRAMUSCULAR | Status: DC | PRN
  Administered 2024-04-21: 4 mg via INTRAVENOUS

## 2024-04-21 MED ORDER — KETOROLAC TROMETHAMINE 15 MG/ML IJ SOLN
INTRAMUSCULAR | Status: AC
  Filled 2024-04-21: qty 1

## 2024-04-21 MED ORDER — SODIUM CHLORIDE 0.9% FLUSH: 3.0000 mL | INTRAVENOUS | Status: DC | PRN

## 2024-04-21 MED ORDER — VASHE WOUND IRRIGATION OPTIME
TOPICAL | Status: DC | PRN
  Administered 2024-04-21: 34 [oz_av]

## 2024-04-21 MED ORDER — SODIUM CHLORIDE 0.9 % IV SOLN: 250.0000 mL | INTRAVENOUS | Status: DC | PRN

## 2024-04-21 MED ORDER — PROPOFOL 10 MG/ML IV BOLUS
INTRAVENOUS | Status: DC | PRN
  Administered 2024-04-21: 150 mg via INTRAVENOUS

## 2024-04-21 MED ORDER — FENTANYL CITRATE (PF) 100 MCG/2ML IJ SOLN
INTRAMUSCULAR | Status: DC | PRN
  Administered 2024-04-21: 50 ug via INTRAVENOUS
  Administered 2024-04-21 (×2): 25 ug via INTRAVENOUS

## 2024-04-21 MED ORDER — DEXMEDETOMIDINE HCL IN NACL 80 MCG/20ML IV SOLN
INTRAVENOUS | Status: DC | PRN
  Administered 2024-04-21: 8 ug via INTRAVENOUS

## 2024-04-21 MED ORDER — MORPHINE SULFATE (PF) 4 MG/ML IV SOLN
INTRAVENOUS | Status: AC
  Filled 2024-04-21: qty 1

## 2024-04-21 MED ORDER — LACTATED RINGERS IV SOLN: INTRAVENOUS | Status: DC

## 2024-04-21 MED ORDER — CEFAZOLIN SODIUM-DEXTROSE 2-4 GM/100ML-% IV SOLN
INTRAVENOUS | Status: AC
Start: 2024-04-21 — End: 2024-04-21
  Filled 2024-04-21: qty 100

## 2024-04-21 MED ORDER — ACETAMINOPHEN 500 MG PO TABS
ORAL_TABLET | ORAL | Status: AC
  Filled 2024-04-21: qty 2

## 2024-04-21 MED ORDER — OXYCODONE HCL 5 MG PO TABS
ORAL_TABLET | ORAL | Status: AC
  Filled 2024-04-21: qty 1

## 2024-04-21 MED ORDER — KETOROLAC TROMETHAMINE 15 MG/ML IJ SOLN
15.0000 mg | Freq: Once | INTRAMUSCULAR | Status: AC
  Administered 2024-04-21: 15 mg via INTRAVENOUS

## 2024-04-21 MED ORDER — FENTANYL CITRATE (PF) 100 MCG/2ML IJ SOLN
25.0000 ug | INTRAMUSCULAR | Status: DC | PRN
  Administered 2024-04-21 (×3): 50 ug via INTRAVENOUS

## 2024-04-21 MED ORDER — SODIUM CHLORIDE 0.9% FLUSH
3.0000 mL | Freq: Two times a day (BID) | INTRAVENOUS | Status: DC
Start: 2024-04-21 — End: 2024-04-21

## 2024-04-21 MED ORDER — ACETAMINOPHEN 325 MG PO TABS: 650.0000 mg | ORAL_TABLET | ORAL | Status: DC | PRN

## 2024-04-21 SURGICAL SUPPLY — 65 items
BAG DECANTER FOR FLEXI CONT (MISCELLANEOUS) ×1 IMPLANT
BINDER BREAST LRG (GAUZE/BANDAGES/DRESSINGS) IMPLANT
BINDER BREAST MEDIUM (GAUZE/BANDAGES/DRESSINGS) IMPLANT
BINDER BREAST XLRG (GAUZE/BANDAGES/DRESSINGS) IMPLANT
BINDER BREAST XXLRG (GAUZE/BANDAGES/DRESSINGS) IMPLANT
BIOPATCH RED 1 DISK 7.0 (GAUZE/BANDAGES/DRESSINGS) IMPLANT
BLADE HEX COATED 2.75 (ELECTRODE) ×1 IMPLANT
BLADE SURG 15 STRL LF DISP TIS (BLADE) ×2 IMPLANT
BNDG GAUZE DERMACEA FLUFF 4 (GAUZE/BANDAGES/DRESSINGS) ×2 IMPLANT
CANISTER SUCT 1200ML W/VALVE (MISCELLANEOUS) ×1 IMPLANT
COVER BACK TABLE 60X90IN (DRAPES) ×1 IMPLANT
COVER MAYO STAND STRL (DRAPES) ×1 IMPLANT
DERMABOND ADVANCED .7 DNX12 (GAUZE/BANDAGES/DRESSINGS) IMPLANT
DRAIN CHANNEL 15F RND FF W/TCR (WOUND CARE) IMPLANT
DRAIN CHANNEL 19F RND (DRAIN) IMPLANT
DRAPE LAPAROSCOPIC ABDOMINAL (DRAPES) ×1 IMPLANT
DRAPE UTILITY XL STRL (DRAPES) IMPLANT
DRESSING MEPILEX FLEX 4X4 (GAUZE/BANDAGES/DRESSINGS) ×1 IMPLANT
DRSG TEGADERM 4X4.75 (GAUZE/BANDAGES/DRESSINGS) IMPLANT
ELECT BLADE 6.5 EXT (BLADE) IMPLANT
ELECTRODE BLDE 4.0 EZ CLN MEGD (MISCELLANEOUS) IMPLANT
ELECTRODE REM PT RTRN 9FT ADLT (ELECTROSURGICAL) ×1 IMPLANT
EVACUATOR SILICONE 100CC (DRAIN) IMPLANT
GAUZE PAD ABD 8X10 STRL (GAUZE/BANDAGES/DRESSINGS) ×2 IMPLANT
GLOVE BIO SURGEON STRL SZ 6.5 (GLOVE) ×2 IMPLANT
GLOVE BIO SURGEON STRL SZ7.5 (GLOVE) IMPLANT
GLOVE BIOGEL PI IND STRL 8 (GLOVE) IMPLANT
GOWN STRL REUS W/ TWL LRG LVL3 (GOWN DISPOSABLE) ×2 IMPLANT
IV NS 1000ML BAXH (IV SOLUTION) IMPLANT
IV NS 500ML BAXH (IV SOLUTION) IMPLANT
KIT FILL ASEPTIC TRANSFER (MISCELLANEOUS) IMPLANT
NDL HYPO 25X1 1.5 SAFETY (NEEDLE) ×1 IMPLANT
NDL SAFETY ECLIPSE 18X1.5 (NEEDLE) IMPLANT
NEEDLE HYPO 25X1 1.5 SAFETY (NEEDLE) ×2 IMPLANT
NS IRRIG 1000ML POUR BTL (IV SOLUTION) ×1 IMPLANT
PACK BASIN DAY SURGERY FS (CUSTOM PROCEDURE TRAY) ×1 IMPLANT
PENCIL SMOKE EVACUATOR (MISCELLANEOUS) ×1 IMPLANT
PIN SAFETY STERILE (MISCELLANEOUS) IMPLANT
POWDER MYRIAD MORCLLS FINE 500 (Miscellaneous) IMPLANT
SLEEVE SCD COMPRESS KNEE MED (STOCKING) ×1 IMPLANT
SOL PREP POV-IOD 4OZ 10% (MISCELLANEOUS) IMPLANT
SPIKE FLUID TRANSFER (MISCELLANEOUS) IMPLANT
SPONGE T-LAP 18X18 ~~LOC~~+RFID (SPONGE) ×2 IMPLANT
STAPLER SKIN PROX WIDE 3.9 (STAPLE) IMPLANT
STRIP CLOSURE SKIN 1/2X4 (GAUZE/BANDAGES/DRESSINGS) IMPLANT
STRIP SUTURE WOUND CLOSURE 1/2 (MISCELLANEOUS) IMPLANT
SUT MNCRL AB 4-0 PS2 18 (SUTURE) ×1 IMPLANT
SUT MON AB 3-0 SH27 (SUTURE) ×1 IMPLANT
SUT MON AB 5-0 PS2 18 (SUTURE) ×2 IMPLANT
SUT PDS AB 2-0 CT2 27 (SUTURE) IMPLANT
SUT PDS II 3-0 CT2 27 ABS (SUTURE) IMPLANT
SUT PROLENE 3 0 PS 2 (SUTURE) IMPLANT
SUT SILK 3 0 PS 1 (SUTURE) IMPLANT
SUT VIC AB 3-0 SH 27X BRD (SUTURE) IMPLANT
SUT VIC AB 4-0 PS2 18 (SUTURE) IMPLANT
SWAB COLLECTION DEVICE MRSA (MISCELLANEOUS) IMPLANT
SWAB CULTURE ESWAB REG 1ML (MISCELLANEOUS) IMPLANT
SYR 50ML LL SCALE MARK (SYRINGE) IMPLANT
SYR BULB IRRIG 60ML STRL (SYRINGE) ×1 IMPLANT
SYR CONTROL 10ML LL (SYRINGE) ×1 IMPLANT
TOWEL GREEN STERILE FF (TOWEL DISPOSABLE) ×2 IMPLANT
TRAY DSU PREP LF (CUSTOM PROCEDURE TRAY) ×1 IMPLANT
TUBE CONNECTING 20X1/4 (TUBING) ×1 IMPLANT
UNDERPAD 30X36 HEAVY ABSORB (UNDERPADS AND DIAPERS) ×2 IMPLANT
YANKAUER SUCT BULB TIP NO VENT (SUCTIONS) ×1 IMPLANT

## 2024-04-21 NOTE — Discharge Instructions (Addendum)
 INSTRUCTIONS FOR AFTER BREAST SURGERY   You will likely have some questions about what to expect following your operation.  The following information will help you and your family understand what to expect when you are discharged from the hospital.  It is important to follow these guidelines to help ensure a smooth recovery and reduce complication.  Postoperative instructions include information on: diet, wound care, medications and physical activity.  AFTER SURGERY Expect to go home after the procedure.  In some cases, you may need to spend one night in the hospital for observation.  DIET Breast surgery does not require a specific diet.  However, the healthier you eat the better your body will heal. It is important to increasing your protein intake.  This means limiting the foods with sugar and carbohydrates.  Focus on vegetables and some meat.  If you have liposuction during your procedure be sure to drink water.  If your urine is bright yellow, then it is concentrated, and you need to drink more water.  As a general rule after surgery, you should have 8 ounces of water every hour while awake.  If you find you are persistently nauseated or unable to take in liquids let us  know.  NO TOBACCO USE or EXPOSURE.  This will slow your healing process and lead to a wound.  WOUND CARE Leave the binder on for 3 days . Use fragrance free soap like Dial, Dove or [place].   After 3 days you can remove the binder to shower. Once dry apply binder or sports bra. If you have liposuction you will have a soft and spongy dressing (Lipofoam) that helps prevent creases in your skin.  Remove before you shower and then replace it.  It is also available on [employer]. If you have steri-strips / tape directly attached to your skin leave them in place. It is OK to get these wet.   No baths, pools or hot tubs for four weeks. We close your incision to leave the smallest and best-looking scar. No ointment or creams on your incisions  for four weeks.  No Neosporin (Too many skin reactions).  A few weeks after surgery you can use Mederma and start massaging the scar. We ask you to wear your binder or sports bra for the first 6 weeks around the clock, including while sleeping. This provides added comfort and helps reduce the fluid accumulation at the surgery site. NO Ice or heating pads to the operative site.  You have a very high risk of a BURN before you feel the temperature change.  ACTIVITY No heavy lifting until cleared by the doctor.  This usually means no more than a half-gallon of milk.  It is OK to walk and climb stairs. Moving your legs is very important to decrease your risk of a blood clot.  It will also help keep you from getting deconditioned.  Every 1 to 2 hours get up and walk for 5 minutes. This will help with a quicker recovery back to normal.  Let pain be your guide so you don't do too much.  This time is for you to recover.  You will be more comfortable if you sleep and rest with your head elevated either with a few pillows under you or in a recliner.  No stomach sleeping for a three months.  WORK Everyone returns to work at different times. As a rough guide, most people take at least 1 - 2 weeks off prior to returning to work. If  you need documentation for your job, give the forms to the front staff at the clinic.  DRIVING Arrange for someone to bring you home from the hospital after your surgery.  You may be able to drive a few days after surgery but not while taking any narcotics or valium .  BOWEL MOVEMENTS Constipation can occur after anesthesia and while taking pain medication.  It is important to stay ahead for your comfort.  We recommend taking Milk of Magnesia (2 tablespoons; twice a day) while taking the pain pills.  MEDICATIONS You may be prescribed should start after surgery At your preoperative visit for you history and physical you were given the following medications: Antibiotic: Start this  medication when you get home and take according to the instructions on the bottle. Zofran  4 mg:  This is to treat nausea and vomiting.  You can take this every 6 hours as needed and only if needed. Oxycodone  5 mg every 6 hours for 3 - 5 days.  This is to be used after you have taken the Motrin or the Tylenol . 4.   Gabapentin  300 mg every 12 hours for 7 days.  Over the counter Medication to take: Ibuprofen (Motrin) 400 - 600 mg every 6 hour for 7 days Tylenol  500 mg every 6 hours for 7 days.  Only take the Oxycodone  after you have tried these two. No tylenol  before 2:30 pm. MiraLAX  or stool softener of choice: Take this according to the bottle if you take the Norco.  If muscle work done:  Flexeril  5 mg every 12 hours for 7 days.  WHEN TO CALL Call your surgeon's office if any of the following occur: Fever 101 degrees F or greater Excessive bleeding or fluid from the incision site. Pain that increases over time without aid from the medications Redness, warmth, or pus draining from incision sites Persistent nausea or inability to take in liquids Severe misshapen area that underwent the operation.  No tylenol  before 2:30 pm.  Information for Discharge Teaching: EXPAREL (bupivacaine liposome injectable suspension)   Pain relief is important to your recovery. The goal is to control your pain so you can move easier and return to your normal activities as soon as possible after your procedure. Your physician may use several types of medicines to manage pain, swelling, and more.  Your surgeon or anesthesiologist gave you EXPAREL(bupivacaine) to help control your pain after surgery.  EXPAREL is a local anesthetic designed to release slowly over an extended period of time to provide pain relief by numbing the tissue around the surgical site. EXPAREL is designed to release pain medication over time and can control pain for up to 72 hours. Depending on how you respond to EXPAREL, you may require  less pain medication during your recovery. EXPAREL can help reduce or eliminate the need for opioids during the first few days after surgery when pain relief is needed the most. EXPAREL is not an opioid and is not addictive. It does not cause sleepiness or sedation.   Important! A teal colored band has been placed on your arm with the date, time and amount of EXPAREL you have received. Please leave this armband in place for the full 96 hours following administration, and then you may remove the band. If you return to the hospital for any reason within 96 hours following the administration of EXPAREL, the armband provides important information that your health care providers to know, and alerts them that you have received this anesthetic.  Possible side effects of EXPAREL: Temporary loss of sensation or ability to move in the area where medication was injected. Nausea, vomiting, constipation Rarely, numbness and tingling in your mouth or lips, lightheadedness, or anxiety may occur. Call your doctor right away if you think you may be experiencing any of these sensations, or if you have other questions regarding possible side effects.  Follow all other discharge instructions given to you by your surgeon or nurse. Eat a healthy diet and drink plenty of water or other fluids.   Post Anesthesia Home Care Instructions  Activity: Get plenty of rest for the remainder of the day. A responsible individual must stay with you for 24 hours following the procedure.  For the next 24 hours, DO NOT: -Drive a car -Advertising copywriter -Drink alcoholic beverages -Take any medication unless instructed by your physician -Make any legal decisions or sign important papers.  Meals: Start with liquid foods such as gelatin or soup. Progress to regular foods as tolerated. Avoid greasy, spicy, heavy foods. If nausea and/or vomiting occur, drink only clear liquids until the nausea and/or vomiting subsides. Call your  physician if vomiting continues.  Special Instructions/Symptoms: Your throat may feel dry or sore from the anesthesia or the breathing tube placed in your throat during surgery. If this causes discomfort, gargle with warm salt water. The discomfort should disappear within 24 hours.  If you had a scopolamine  patch placed behind your ear for the management of post- operative nausea and/or vomiting:  1. The medication in the patch is effective for 72 hours, after which it should be removed.  Wrap patch in a tissue and discard in the trash. Wash hands thoroughly with soap and water. 2. You may remove the patch earlier than 72 hours if you experience unpleasant side effects which may include dry mouth, dizziness or visual disturbances. 3. Avoid touching the patch. Wash your hands with soap and water after contact with the patch.     NO NSAIDS (Ibuprofen/Motrin/Aleve) before 9:30pm tonight. You had 5mg  of Oxycodone  at 3:20pm today.

## 2024-04-21 NOTE — Op Note (Signed)
 Op report  DATE OF OPERATION: 04/21/2024  LOCATION: [facility]  SURGICAL DIVISION: Plastic Surgery  PREOPERATIVE DIAGNOSIS:  1.History of breast cancer.  2. Acquired absence of bilateral breast.  3. Ruptured implant.  POSTOPERATIVE DIAGNOSIS:  Same as preoperative diagnosis  PROCEDURE:  1. Removal of right breast silicone implant. 2. Removal of left breast silicone ruptured implant. 3. Bilateral removal of breast capsule 100 cm2  SURGEON: Estefana Fritter, DO  ASSISTANT: Estefana Peck, PA  ANESTHESIA:  General.   COMPLICATIONS: None.   INDICATIONS FOR PROCEDURE:  The patient, [name] [name], is a 63 y.o. female born on 26-Feb-1961, is here for treatment after bilateral mastectomies.  She had implants placed many years ago.  She recently found the left breast to be ruptured.  She also complains of symptoms that may be related to the implants.  She decided to have the implants removed and not replaced.   MRN: [id number]  CONSENT:  Informed consent was obtained directly from the patient. Risks, benefits and alternatives were fully discussed. Specific risks including but not limited to bleeding, infection, hematoma, seroma, scarring, pain, implant infection, implant extrusion, capsular contracture, asymmetry, wound healing problems, and need for further surgery were all discussed. The patient did have an ample opportunity to have her questions answered to her satisfaction.   DESCRIPTION OF PROCEDURE:  The patient was taken to the operating room. SCDs were placed and IV antibiotics were given. The patient's chest was prepped and draped in a sterile fashion. A time out was performed and the implants to be used were identified.    On the right breast: Local with epinephrine  was used to infiltrate at the incision site. The old mastectomy scar was incised.  The mastectomy flaps from the superior and inferior flaps were raised over the muscle for several  centimeters to minimize tension for the closure. The pectoralis was split inferior to the skin incision to expose and remove the implant (800 cc).  The pocket was irrigated with Vashe. The lateral portion of the capsule was excised.  This was 100 cm2.  There was going to be more bleeding if I removed all of the capsule.  It was thin.  Myriad was placed in the pocket.  A drain was placed.  Experel was injected into the lateral muscle.  The muscle edges were brought together and secured with the 3-0 PDS. The skin was closed with the 3-0 Monocryl suture.  On the left breast: Local with epinephrine  was used to infiltrate at the incision site. The old mastectomy scar was incised.  The mastectomy flaps from the superior and inferior flaps were raised over the muscle for several centimeters to minimize tension for the closure. The pectoralis was split inferior to the skin incision to expose and remove the rupture implant.  The pocket was irrigated with Vashe. The lateral portion of the capsule was excised.  This was 100 cm2.  There was going to be more bleeding if I removed all of the capsule.  It was thin.  Myriad was placed in the pocket.  A drain was placed.  Experel was injected into the lateral muscle.  The muscle edges were brought together and secured with the 3-0 PDS. The skin was closed with the 3-0 Monocryl suture.  Dermabond was applied to the incision site. A breast binder and ABDs were placed.  The patient was allowed to wake from anesthesia and taken to the recovery room in satisfactory condition.   The advanced practice practitioner (  APP) assisted throughout the case.  The APP was essential in retraction and counter traction when needed to make the case progress smoothly.  This retraction and assistance made it possible to see the tissue plans for the procedure.  The assistance was needed for blood control, tissue re-approximation and assisted with closure of the incision site.

## 2024-04-21 NOTE — Transfer of Care (Signed)
 Immediate Anesthesia Transfer of Care Note  Patient: [name] [name]  Procedure(s) Performed: REMOVAL, IMPLANT, BREAST (Bilateral: Breast) CAPSULECTOMY, BREAST (Bilateral: Breast)  Patient Location: PACU  Anesthesia Type:General  Level of Consciousness: awake, alert , and oriented  Airway & Oxygen Therapy: Patient Spontanous Breathing and Patient connected to face mask oxygen  Post-op Assessment: Report given to RN and Post -op Vital signs reviewed and stable  Post vital signs: Reviewed and stable  Last Vitals:  Vitals Value Taken Time  BP 104/66 04/21/24 12:15  Temp    Pulse 97 04/21/24 12:15  Resp 20 04/21/24 12:15  SpO2 100 % 04/21/24 12:15  Vitals shown include unfiled device data.  Last Pain:  Vitals:   04/21/24 0829  TempSrc: Temporal  PainSc: 0-No pain      Patients Stated Pain Goal: 3 (04/21/24 0829)  Complications: No notable events documented.

## 2024-04-21 NOTE — Anesthesia Postprocedure Evaluation (Signed)
 Anesthesia Post Note  Patient: [name] [name]  Procedure(s) Performed: REMOVAL, IMPLANT, BREAST (Bilateral: Breast) CAPSULECTOMY, BREAST (Bilateral: Breast)     Patient location during evaluation: PACU Anesthesia Type: General Level of consciousness: awake Pain management: pain level controlled Vital Signs Assessment: post-procedure vital signs reviewed and stable Respiratory status: spontaneous breathing, nonlabored ventilation and respiratory function stable Cardiovascular status: blood pressure returned to baseline and stable Postop Assessment: no apparent nausea or vomiting Anesthetic complications: no   No notable events documented.  Last Vitals:  Vitals:   04/21/24 1445 04/21/24 1451  BP: 127/70   Pulse: 93 87  Resp: 16 20  Temp:    SpO2: 97% 97%    Last Pain:  Vitals:   04/21/24 1451  TempSrc:   PainSc: 3                  Delon Aisha Arch

## 2024-04-21 NOTE — Anesthesia Procedure Notes (Signed)
 Procedure Name: LMA Insertion Date/Time: 04/21/2024 10:57 AM  Performed by: Emilio Rock BIRCH, CRNAPre-anesthesia Checklist: Patient identified, Emergency Drugs available, Suction available and Patient being monitored Patient Re-evaluated:Patient Re-evaluated prior to induction Oxygen Delivery Method: Circle System Utilized Preoxygenation: Pre-oxygenation with 100% oxygen Induction Type: IV induction Ventilation: Mask ventilation without difficulty LMA: LMA inserted LMA Size: 4.0 Number of attempts: 1 Airway Equipment and Method: bite block Placement Confirmation: positive ETCO2 Tube secured with: Tape Dental Injury: Teeth and Oropharynx as per pre-operative assessment

## 2024-04-21 NOTE — Interval H&P Note (Signed)
 History and Physical Interval Note:  04/21/2024 10:20 AM  [name] [name]  has presented today for surgery, with the diagnosis of Ductal carcinoma in situ.  The various methods of treatment have been discussed with the patient and family. After consideration of risks, benefits and other options for treatment, the patient has consented to  Procedure(s) with comments: REMOVAL, IMPLANT, BREAST (Bilateral) - Removal of bilateral breast implants with complete capsulectomies CAPSULECTOMY, BREAST (Bilateral) as a surgical intervention.  The patient's history has been reviewed, patient examined, no change in status, stable for surgery.  I have reviewed the patient's chart and labs.  Questions were answered to the patient's satisfaction.     [name] [name]

## 2024-04-22 ENCOUNTER — Encounter (HOSPITAL_BASED_OUTPATIENT_CLINIC_OR_DEPARTMENT_OTHER): Payer: Self-pay | Admitting: Plastic Surgery

## 2024-04-25 LAB — SURGICAL PATHOLOGY

## 2024-04-28 NOTE — Progress Notes (Signed)
 Patient is a 63 year old female who recently underwent removal of right breast silicone implant, removal of left breast silicone ruptured implant and bilateral removal of breast capsule with Dr. Lowery on 04/21/2024.  Patient is about 1 week postop.  She presents to the clinic today for postoperative follow-up.  Today,

## 2024-04-29 ENCOUNTER — Ambulatory Visit: Admitting: Student

## 2024-04-29 VITALS — BP 114/71 | HR 68

## 2024-04-29 DIAGNOSIS — Z9889 Other specified postprocedural states: Secondary | ICD-10-CM

## 2024-04-29 DIAGNOSIS — D0510 Intraductal carcinoma in situ of unspecified breast: Secondary | ICD-10-CM

## 2024-04-29 DIAGNOSIS — T8543XA Leakage of breast prosthesis and implant, initial encounter: Secondary | ICD-10-CM

## 2024-05-03 ENCOUNTER — Other Ambulatory Visit: Payer: Self-pay | Admitting: Family Medicine

## 2024-05-03 DIAGNOSIS — F419 Anxiety disorder, unspecified: Secondary | ICD-10-CM

## 2024-05-03 NOTE — Telephone Encounter (Signed)
 Requested Prescriptions   Pending Prescriptions Disp Refills   ALPRAZolam  (XANAX ) 0.5 MG tablet [Pharmacy Med Name: ALPRAZOLAM  0.5 MG TABLET] 60 tablet 1    Sig: TAKE 1 TABLET BY MOUTH TWICE A DAY     Date of patient request: 05/03/2024 Last office visit: 03/30/2024 Upcoming visit: 06/28/2024 Date of last refill: 03/04/2024 Last refill amount: 30 with 1 refill

## 2024-05-09 ENCOUNTER — Other Ambulatory Visit (INDEPENDENT_AMBULATORY_CARE_PROVIDER_SITE_OTHER)

## 2024-05-09 DIAGNOSIS — E038 Other specified hypothyroidism: Secondary | ICD-10-CM

## 2024-05-09 LAB — TSH: TSH: 0.56 u[IU]/mL (ref 0.35–5.50)

## 2024-05-10 ENCOUNTER — Ambulatory Visit: Payer: Self-pay | Admitting: Family

## 2024-05-11 NOTE — Progress Notes (Signed)
 Pt has reviewed labs via MyChart

## 2024-05-13 ENCOUNTER — Ambulatory Visit: Admitting: Plastic Surgery

## 2024-05-13 ENCOUNTER — Encounter: Payer: Self-pay | Admitting: Plastic Surgery

## 2024-05-13 DIAGNOSIS — T8543XA Leakage of breast prosthesis and implant, initial encounter: Secondary | ICD-10-CM

## 2024-05-13 DIAGNOSIS — Z9889 Other specified postprocedural states: Secondary | ICD-10-CM

## 2024-05-13 NOTE — Progress Notes (Signed)
   Subjective:    Patient ID: Graylin LITTIE Stammer, female    DOB: [birth year], 63 y.o.   MRN: [id number]  The patient is a 63 year old female here for follow-up after having her implants removed.  She had breast cancer in 2014.  She had bilateral mastectomies with reconstruction.  She had 750 to 800 cc silicone implants in place.  She was diagnosed with rupture and decided to have them both removed without new ones placed back.  They were removed on October 16.  She has a little bit of fluid buildup but no signs of redness or swelling.      Review of Systems  Constitutional: Negative.   Eyes: Negative.   Respiratory: Negative.    Cardiovascular: Negative.   Gastrointestinal: Negative.   Endocrine: Negative.   Genitourinary: Negative.        Objective:   Physical Exam Vitals reviewed.  HENT:     Head: Atraumatic.  Cardiovascular:     Rate and Rhythm: Normal rate.     Pulses: Normal pulses.  Skin:    Capillary Refill: Capillary refill takes less than 2 seconds.  Neurological:     Mental Status: She is alert and oriented to person, place, and time.  Psychiatric:        Mood and Affect: Mood normal.        Behavior: Behavior normal.        Assessment & Plan:     ICD-10-CM   1. Rupture of implant of left breast, initial encounter  T85.43XA       Patient does not want the fluid drained today which is reasonable.  Will see her back in 2 to 3 weeks.  If it gets any more full we will plan to drain it.  She might need a percutaneous drain placed by radiology if it does not resolve.  We also talked about emotional support and we are gena reach out to a light to see if they have any support groups available.

## 2024-05-20 ENCOUNTER — Telehealth: Payer: Self-pay | Admitting: *Deleted

## 2024-05-20 ENCOUNTER — Encounter: Admitting: Student

## 2024-05-20 NOTE — Telephone Encounter (Signed)
 Entered in error.//AR/CMA

## 2024-05-22 ENCOUNTER — Other Ambulatory Visit: Payer: Self-pay | Admitting: Family Medicine

## 2024-05-23 ENCOUNTER — Telehealth: Payer: Self-pay | Admitting: *Deleted

## 2024-05-23 NOTE — Telephone Encounter (Signed)
 F/u:   The patient is returning [place] called back.

## 2024-05-23 NOTE — Telephone Encounter (Signed)
 F/u   Returning call back to the office.   Can leave a voicemail

## 2024-05-23 NOTE — Telephone Encounter (Signed)
 Received on (05/18/2024) via of fax DME Standard Written Order from [facility].  Requesting signature and return.  Given to provider to sign.    DME Standard Written Order signed and faxed back to [facility].  Confirmation received and copy scanned into the chart.//AB/CMA

## 2024-05-23 NOTE — Telephone Encounter (Signed)
 Entered in error.//AR/CMA

## 2024-05-24 ENCOUNTER — Encounter: Admitting: Student

## 2024-06-07 ENCOUNTER — Ambulatory Visit: Admitting: Plastic Surgery

## 2024-06-07 DIAGNOSIS — Z9889 Other specified postprocedural states: Secondary | ICD-10-CM

## 2024-06-07 DIAGNOSIS — T8543XA Leakage of breast prosthesis and implant, initial encounter: Secondary | ICD-10-CM

## 2024-06-07 NOTE — Progress Notes (Signed)
 Patient is a 63 year old female here for follow-up after removal of her implants.  Overall she is doing really well.  There is less fluid on the right side today but it seems like the left side had a little more.  We went ahead and drained it and there was 50 cc of serosanguineous fluid.  Nothing that looked worrisome at all.  Continue with the binder.  She can lay on her side and lay flat for nighttime.  Like to see her back in 2 weeks.  Call with any questions or concerns.

## 2024-06-09 ENCOUNTER — Ambulatory Visit (HOSPITAL_BASED_OUTPATIENT_CLINIC_OR_DEPARTMENT_OTHER)
Admission: RE | Admit: 2024-06-09 | Discharge: 2024-06-09 | Disposition: A | Source: Ambulatory Visit | Attending: Family Medicine | Admitting: Family Medicine

## 2024-06-09 DIAGNOSIS — M8589 Other specified disorders of bone density and structure, multiple sites: Secondary | ICD-10-CM | POA: Diagnosis not present

## 2024-06-09 DIAGNOSIS — Z780 Asymptomatic menopausal state: Secondary | ICD-10-CM | POA: Diagnosis not present

## 2024-06-15 ENCOUNTER — Other Ambulatory Visit: Payer: Self-pay | Admitting: Family Medicine

## 2024-06-21 ENCOUNTER — Telehealth: Payer: Self-pay | Admitting: *Deleted

## 2024-06-21 ENCOUNTER — Ambulatory Visit: Admitting: Plastic Surgery

## 2024-06-21 ENCOUNTER — Encounter: Payer: Self-pay | Admitting: Plastic Surgery

## 2024-06-21 VITALS — BP 118/72 | HR 91

## 2024-06-21 DIAGNOSIS — Z853 Personal history of malignant neoplasm of breast: Secondary | ICD-10-CM

## 2024-06-21 DIAGNOSIS — D0510 Intraductal carcinoma in situ of unspecified breast: Secondary | ICD-10-CM

## 2024-06-21 DIAGNOSIS — T8543XA Leakage of breast prosthesis and implant, initial encounter: Secondary | ICD-10-CM

## 2024-06-21 NOTE — Progress Notes (Signed)
° °  Subjective:    Patient ID: [name] [name], female    DOB: Mar 20, 1961, 63 y.o.   MRN: [id number]  The patient is a 63 year old female here for follow-up after removal of her breast implants.  There is nice improvement in the overall contour.  Decrease in the amount of seroma.  The seroma on the left is improving.  The patient has a tender rib on the right and the medial aspect.  On the left side the patient states that she has some charley horse-like feelings and not sure if she should still be having that after 8 weeks.  Overall she is pleased with her result and feeling better.      Review of Systems  Constitutional: Negative.   Eyes: Negative.   Respiratory: Negative.    Cardiovascular: Negative.   Gastrointestinal: Negative.   Endocrine: Negative.        Objective:   Physical Exam Vitals reviewed.  Constitutional:      Appearance: Normal appearance.  HENT:     Head: Atraumatic.  Cardiovascular:     Rate and Rhythm: Normal rate.     Pulses: Normal pulses.  Pulmonary:     Effort: Pulmonary effort is normal.  Skin:    General: Skin is warm.     Capillary Refill: Capillary refill takes less than 2 seconds.  Neurological:     Mental Status: She is alert and oriented to person, place, and time.  Psychiatric:        Mood and Affect: Mood normal.        Behavior: Behavior normal.        Thought Content: Thought content normal.        Judgment: Judgment normal.       Assessment & Plan:     ICD-10-CM   1. Ductal carcinoma in situ (DCIS) of breast, unspecified laterality  D05.10     2. History of breast cancer  Z85.3     3. Rupture of implant of left breast, initial encounter  T85.43XA       Continue compression and we will plan to see her back in 2 months.  If she wants to do physical therapy she can call and I am happy to set that up for her.

## 2024-06-21 NOTE — Telephone Encounter (Signed)
 Emailed copy of signed DME Standard Written Order to [facility].//AR/CMA

## 2024-06-26 ENCOUNTER — Other Ambulatory Visit: Payer: Self-pay | Admitting: Family Medicine

## 2024-06-28 ENCOUNTER — Ambulatory Visit (INDEPENDENT_AMBULATORY_CARE_PROVIDER_SITE_OTHER): Admitting: Family Medicine

## 2024-06-28 ENCOUNTER — Encounter: Payer: Self-pay | Admitting: Family Medicine

## 2024-06-28 VITALS — BP 100/64 | HR 85 | Temp 98.4°F | Resp 14 | Ht 62.0 in | Wt 132.0 lb

## 2024-06-28 DIAGNOSIS — M62838 Other muscle spasm: Secondary | ICD-10-CM | POA: Diagnosis not present

## 2024-06-28 DIAGNOSIS — F32A Depression, unspecified: Secondary | ICD-10-CM

## 2024-06-28 DIAGNOSIS — F419 Anxiety disorder, unspecified: Secondary | ICD-10-CM | POA: Diagnosis not present

## 2024-06-28 DIAGNOSIS — E663 Overweight: Secondary | ICD-10-CM

## 2024-06-28 MED ORDER — ALPRAZOLAM 0.5 MG PO TABS: 0.5000 mg | ORAL_TABLET | Freq: Three times a day (TID) | ORAL | 1 refills | Status: AC | PRN

## 2024-06-28 MED ORDER — METHOCARBAMOL 500 MG PO TABS: 500.0000 mg | ORAL_TABLET | Freq: Three times a day (TID) | ORAL | 0 refills | Status: AC | PRN

## 2024-06-28 NOTE — Progress Notes (Signed)
" ° °  Subjective:    Patient ID: [name] [name], female    DOB: [birth year], 63 y.o.   MRN: [id number]  HPI Overweight- pt is down 15 lbs since last visit.  BMI now normal.  Pt is only drinking water- no calories.  Eating protein and veggies.    Anxiety- pt reports feeling very anxious and very on edge.  She feels that her high anxiety is due to her recent breast surgery.  Is not interested in changing medication at this time- 'i just have to find a way through it'.  L trap spasm- new.  Pt admits this is stress related and that she is always tense, particularly on L side.  Is very reluctant to use muscle relaxers.  Has tried heat and OTC pain meds w/o relief.   Review of Systems For ROS see HPI     Objective:   Physical Exam Vitals reviewed.  Constitutional:      General: She is not in acute distress.    Appearance: Normal appearance. She is not ill-appearing.  HENT:     Head: Normocephalic and atraumatic.  Eyes:     Extraocular Movements: Extraocular movements intact.     Conjunctiva/sclera: Conjunctivae normal.  Cardiovascular:     Rate and Rhythm: Normal rate and regular rhythm.     Pulses: Normal pulses.  Pulmonary:     Effort: Pulmonary effort is normal. No respiratory distress.  Musculoskeletal:     Cervical back: Tenderness (over L trap spasm) present.  Lymphadenopathy:     Cervical: No cervical adenopathy.  Skin:    General: Skin is warm and dry.  Neurological:     General: No focal deficit present.     Mental Status: She is alert and oriented to person, place, and time.  Psychiatric:        Behavior: Behavior normal.        Thought Content: Thought content normal.     Comments: Appears depressed- flat affect           Assessment & Plan:  Trap spasm- new.  Reviewed possible tx options w/ pt.  She does not feel comfortable having a massage after recent breast surgery.  Heat and OTC meds are not effective.  Discussed home massage using a Theragun on a very low  setting.  Provided Methocarbamol  to use prn but pt is not sure she will take these.  "

## 2024-06-28 NOTE — Assessment & Plan Note (Signed)
 Pt is down 15 lbs since last visit and BMI is now in normal range.  Applauded her efforts and encouraged her to maintain current weight.  Will follow.

## 2024-06-28 NOTE — Patient Instructions (Addendum)
 Follow up as needed or as scheduled No need to repeat labs today- yay!! You look FABULOUS!!!  No need to lose any more weight! TRY the Methocarbamol  as needed for spasm Continue heat and gentle massage Call with any questions or concerns Stay Safe!  Stay Healthy! Happy Holidays!!

## 2024-06-28 NOTE — Assessment & Plan Note (Signed)
 Deteriorated.  Pt is having a hard time accepting her new body after having both implants removed.  She is depressed, angry, and trying to come to terms with her new normal.  She is not interested in adjusting medications at this time.  Denies thoughts of self harm.  Will continue to follow and offer support.
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
## 2024-07-29 ENCOUNTER — Telehealth: Payer: Self-pay | Admitting: *Deleted

## 2024-07-29 NOTE — Telephone Encounter (Signed)
 Received on (07/27/2024) via of fax DME Standard Written Order from [facility] to [place].  Requesting signature and return.  Given to provider to sign.  DME Standard Written Order signed and faxed back to [facility] [place].  Confirmation received and copy scanned into the chart.//AR/CMA

## 2024-07-29 NOTE — Telephone Encounter (Signed)
 Received on (07/27/2024) via of fax DME Standard Written Order from [facility] to [place].  Requesting signature and return.  Given to provider to sign.    DME Standard Written Order signed and faxed back to [facility] to [place].  Confirmation received and copy scanned into the chart.//AR/CMA

## 2024-08-10 ENCOUNTER — Ambulatory Visit: Payer: Federal, State, Local not specified - PPO | Admitting: Rheumatology

## 2024-08-10 DIAGNOSIS — M8589 Other specified disorders of bone density and structure, multiple sites: Secondary | ICD-10-CM

## 2024-08-10 DIAGNOSIS — M47816 Spondylosis without myelopathy or radiculopathy, lumbar region: Secondary | ICD-10-CM

## 2024-08-10 DIAGNOSIS — Z840 Family history of diseases of the skin and subcutaneous tissue: Secondary | ICD-10-CM

## 2024-08-10 DIAGNOSIS — M25551 Pain in right hip: Secondary | ICD-10-CM

## 2024-08-10 DIAGNOSIS — R7689 Other specified abnormal immunological findings in serum: Secondary | ICD-10-CM

## 2024-08-10 DIAGNOSIS — M170 Bilateral primary osteoarthritis of knee: Secondary | ICD-10-CM

## 2024-08-10 DIAGNOSIS — Z853 Personal history of malignant neoplasm of breast: Secondary | ICD-10-CM

## 2024-08-10 DIAGNOSIS — M722 Plantar fascial fibromatosis: Secondary | ICD-10-CM

## 2024-08-10 DIAGNOSIS — E890 Postprocedural hypothyroidism: Secondary | ICD-10-CM

## 2024-08-10 DIAGNOSIS — Z8669 Personal history of other diseases of the nervous system and sense organs: Secondary | ICD-10-CM

## 2024-08-10 DIAGNOSIS — K2970 Gastritis, unspecified, without bleeding: Secondary | ICD-10-CM

## 2024-08-10 DIAGNOSIS — G629 Polyneuropathy, unspecified: Secondary | ICD-10-CM

## 2024-08-10 DIAGNOSIS — R413 Other amnesia: Secondary | ICD-10-CM

## 2024-08-10 DIAGNOSIS — F32A Depression, unspecified: Secondary | ICD-10-CM

## 2024-08-10 DIAGNOSIS — G8929 Other chronic pain: Secondary | ICD-10-CM

## 2024-08-10 DIAGNOSIS — M19042 Primary osteoarthritis, left hand: Secondary | ICD-10-CM

## 2024-08-10 DIAGNOSIS — Z8639 Personal history of other endocrine, nutritional and metabolic disease: Secondary | ICD-10-CM

## 2024-08-10 DIAGNOSIS — D132 Benign neoplasm of duodenum: Secondary | ICD-10-CM

## 2024-08-10 DIAGNOSIS — Z8261 Family history of arthritis: Secondary | ICD-10-CM

## 2024-08-10 DIAGNOSIS — M25572 Pain in left ankle and joints of left foot: Secondary | ICD-10-CM

## 2024-08-23 ENCOUNTER — Ambulatory Visit: Admitting: Plastic Surgery

## 2025-04-03 ENCOUNTER — Encounter: Admitting: Family Medicine
